# Patient Record
Sex: Male | Born: 1965 | Race: White | Hispanic: No | Marital: Single | Smoking: Current every day smoker
Health system: Southern US, Community
[De-identification: ages and names within clinical notes are randomized; demographics above are authoritative.]

## PROBLEM LIST (undated history)

## (undated) DIAGNOSIS — J449 Chronic obstructive pulmonary disease, unspecified: Secondary | ICD-10-CM

## (undated) DIAGNOSIS — I499 Cardiac arrhythmia, unspecified: Secondary | ICD-10-CM

## (undated) DIAGNOSIS — K2950 Unspecified chronic gastritis without bleeding: Secondary | ICD-10-CM

## (undated) DIAGNOSIS — F419 Anxiety disorder, unspecified: Secondary | ICD-10-CM

## (undated) DIAGNOSIS — E119 Type 2 diabetes mellitus without complications: Secondary | ICD-10-CM

## (undated) DIAGNOSIS — R0902 Hypoxemia: Secondary | ICD-10-CM

## (undated) DIAGNOSIS — K819 Cholecystitis, unspecified: Secondary | ICD-10-CM

## (undated) DIAGNOSIS — Z8639 Personal history of other endocrine, nutritional and metabolic disease: Secondary | ICD-10-CM

## (undated) DIAGNOSIS — F32A Depression, unspecified: Secondary | ICD-10-CM

## (undated) DIAGNOSIS — E669 Obesity, unspecified: Secondary | ICD-10-CM

## (undated) DIAGNOSIS — F909 Attention-deficit hyperactivity disorder, unspecified type: Secondary | ICD-10-CM

## (undated) DIAGNOSIS — J45909 Unspecified asthma, uncomplicated: Secondary | ICD-10-CM

## (undated) DIAGNOSIS — G4730 Sleep apnea, unspecified: Secondary | ICD-10-CM

## (undated) DIAGNOSIS — F329 Major depressive disorder, single episode, unspecified: Secondary | ICD-10-CM

## (undated) DIAGNOSIS — K810 Acute cholecystitis: Secondary | ICD-10-CM

## (undated) DIAGNOSIS — Z23 Encounter for immunization: Secondary | ICD-10-CM

## (undated) DIAGNOSIS — F17200 Nicotine dependence, unspecified, uncomplicated: Secondary | ICD-10-CM

## (undated) DIAGNOSIS — K219 Gastro-esophageal reflux disease without esophagitis: Secondary | ICD-10-CM

## (undated) DIAGNOSIS — Z9189 Other specified personal risk factors, not elsewhere classified: Secondary | ICD-10-CM

## (undated) DIAGNOSIS — M65312 Trigger thumb, left thumb: Secondary | ICD-10-CM

## (undated) DIAGNOSIS — M1990 Unspecified osteoarthritis, unspecified site: Secondary | ICD-10-CM

## (undated) DIAGNOSIS — I10 Essential (primary) hypertension: Secondary | ICD-10-CM

## (undated) DIAGNOSIS — R6889 Other general symptoms and signs: Secondary | ICD-10-CM

## (undated) HISTORY — DX: Anxiety disorder, unspecified: F41.9

## (undated) HISTORY — PX: UPPER GASTROINTESTINAL ENDOSCOPY: SHX188

## (undated) HISTORY — DX: Hypoxemia: R09.02

## (undated) HISTORY — DX: Trigger thumb, left thumb: M65.312

## (undated) HISTORY — DX: Type 2 diabetes mellitus without complications: E11.9

## (undated) HISTORY — DX: Major depressive disorder, single episode, unspecified: F32.9

## (undated) HISTORY — DX: Unspecified asthma, uncomplicated: J45.909

## (undated) HISTORY — DX: Acute cholecystitis: K81.0

## (undated) HISTORY — DX: Other general symptoms and signs: R68.89

## (undated) HISTORY — DX: Nicotine dependence, unspecified, uncomplicated: F17.200

## (undated) HISTORY — DX: Chronic obstructive pulmonary disease, unspecified: J44.9

## (undated) HISTORY — DX: Unspecified osteoarthritis, unspecified site: M19.90

## (undated) HISTORY — DX: Obesity, unspecified: E66.9

## (undated) HISTORY — DX: Encounter for immunization: Z23

## (undated) HISTORY — DX: Unspecified chronic gastritis without bleeding: K29.50

## (undated) HISTORY — DX: Other specified personal risk factors, not elsewhere classified: Z91.89

## (undated) HISTORY — PX: OTHER SURGICAL HISTORY: SHX169

## (undated) HISTORY — DX: Cholecystitis, unspecified: K81.9

## (undated) HISTORY — DX: Personal history of other endocrine, nutritional and metabolic disease: Z86.39

---

## 2008-04-07 ENCOUNTER — Other Ambulatory Visit: Payer: Self-pay

## 2008-04-07 ENCOUNTER — Emergency Department: Payer: Self-pay | Admitting: Emergency Medicine

## 2012-09-08 ENCOUNTER — Emergency Department: Payer: Self-pay | Admitting: Emergency Medicine

## 2012-09-08 LAB — URINALYSIS, COMPLETE
Bilirubin,UR: NEGATIVE
Glucose,UR: NEGATIVE mg/dL (ref 0–75)
Ketone: NEGATIVE
Nitrite: NEGATIVE
Ph: 5 (ref 4.5–8.0)
Protein: NEGATIVE
Specific Gravity: 1.026 (ref 1.003–1.030)
WBC UR: 1 /HPF (ref 0–5)

## 2012-09-08 LAB — COMPREHENSIVE METABOLIC PANEL
Anion Gap: 10 (ref 7–16)
BUN: 20 mg/dL — ABNORMAL HIGH (ref 7–18)
Bilirubin,Total: 0.3 mg/dL (ref 0.2–1.0)
Calcium, Total: 9.1 mg/dL (ref 8.5–10.1)
Chloride: 107 mmol/L (ref 98–107)
Co2: 28 mmol/L (ref 21–32)
Creatinine: 1.01 mg/dL (ref 0.60–1.30)
EGFR (African American): >60
EGFR (Non-African Amer.): >60
SGOT(AST): 23 U/L (ref 15–37)
SGPT (ALT): 25 U/L (ref 12–78)
Sodium: 145 mmol/L (ref 136–145)

## 2012-09-08 LAB — CBC
HCT: 48.1 % (ref 40.0–52.0)
MCH: 31.9 pg (ref 26.0–34.0)
MCHC: 35.4 g/dL (ref 32.0–36.0)
MCV: 90 fL (ref 80–100)
Platelet: 161 10*3/uL (ref 150–440)
RDW: 14.1 % (ref 11.5–14.5)

## 2012-09-08 LAB — CK TOTAL AND CKMB (NOT AT ARMC)
CK, Total: 95 U/L (ref 35–232)
CK-MB: 0.6 ng/mL (ref 0.5–3.6)

## 2012-09-08 LAB — TROPONIN I: Troponin-I: <0.02 ng/mL

## 2012-09-09 LAB — TROPONIN I: Troponin-I: <0.02 ng/mL

## 2012-09-28 ENCOUNTER — Emergency Department: Payer: Self-pay | Admitting: Emergency Medicine

## 2014-07-24 ENCOUNTER — Ambulatory Visit: Payer: Self-pay | Admitting: Gastroenterology

## 2014-11-16 DIAGNOSIS — J449 Chronic obstructive pulmonary disease, unspecified: Secondary | ICD-10-CM | POA: Diagnosis not present

## 2014-11-18 DIAGNOSIS — J449 Chronic obstructive pulmonary disease, unspecified: Secondary | ICD-10-CM | POA: Diagnosis not present

## 2014-11-18 DIAGNOSIS — I720 Aneurysm of carotid artery: Secondary | ICD-10-CM | POA: Diagnosis not present

## 2014-12-04 DIAGNOSIS — J449 Chronic obstructive pulmonary disease, unspecified: Secondary | ICD-10-CM | POA: Diagnosis not present

## 2014-12-17 DIAGNOSIS — J449 Chronic obstructive pulmonary disease, unspecified: Secondary | ICD-10-CM | POA: Diagnosis not present

## 2014-12-19 DIAGNOSIS — I720 Aneurysm of carotid artery: Secondary | ICD-10-CM | POA: Diagnosis not present

## 2014-12-19 DIAGNOSIS — J449 Chronic obstructive pulmonary disease, unspecified: Secondary | ICD-10-CM | POA: Diagnosis not present

## 2014-12-21 DIAGNOSIS — J449 Chronic obstructive pulmonary disease, unspecified: Secondary | ICD-10-CM | POA: Diagnosis not present

## 2015-01-15 DIAGNOSIS — J449 Chronic obstructive pulmonary disease, unspecified: Secondary | ICD-10-CM | POA: Diagnosis not present

## 2015-01-17 DIAGNOSIS — J449 Chronic obstructive pulmonary disease, unspecified: Secondary | ICD-10-CM | POA: Diagnosis not present

## 2015-01-17 DIAGNOSIS — I720 Aneurysm of carotid artery: Secondary | ICD-10-CM | POA: Diagnosis not present

## 2015-02-15 DIAGNOSIS — J449 Chronic obstructive pulmonary disease, unspecified: Secondary | ICD-10-CM | POA: Diagnosis not present

## 2015-02-17 DIAGNOSIS — J449 Chronic obstructive pulmonary disease, unspecified: Secondary | ICD-10-CM | POA: Diagnosis not present

## 2015-02-17 DIAGNOSIS — I720 Aneurysm of carotid artery: Secondary | ICD-10-CM | POA: Diagnosis not present

## 2015-03-17 DIAGNOSIS — J449 Chronic obstructive pulmonary disease, unspecified: Secondary | ICD-10-CM | POA: Diagnosis not present

## 2015-03-19 DIAGNOSIS — J449 Chronic obstructive pulmonary disease, unspecified: Secondary | ICD-10-CM | POA: Diagnosis not present

## 2015-03-19 DIAGNOSIS — I720 Aneurysm of carotid artery: Secondary | ICD-10-CM | POA: Diagnosis not present

## 2015-04-10 ENCOUNTER — Other Ambulatory Visit: Payer: Self-pay

## 2015-04-10 ENCOUNTER — Encounter: Payer: Self-pay | Admitting: *Deleted

## 2015-04-10 ENCOUNTER — Emergency Department
Admission: EM | Admit: 2015-04-10 | Discharge: 2015-04-10 | Disposition: A | Discharge status: Home / Self Care | Payer: Medicare Other | Attending: Emergency Medicine | Admitting: Emergency Medicine

## 2015-04-10 DIAGNOSIS — Z720 Tobacco use: Secondary | ICD-10-CM | POA: Insufficient documentation

## 2015-04-10 DIAGNOSIS — K2900 Acute gastritis without bleeding: Secondary | ICD-10-CM | POA: Diagnosis not present

## 2015-04-10 DIAGNOSIS — R1013 Epigastric pain: Secondary | ICD-10-CM | POA: Diagnosis present

## 2015-04-10 DIAGNOSIS — F419 Anxiety disorder, unspecified: Secondary | ICD-10-CM | POA: Diagnosis not present

## 2015-04-10 DIAGNOSIS — Z79899 Other long term (current) drug therapy: Secondary | ICD-10-CM | POA: Diagnosis not present

## 2015-04-10 DIAGNOSIS — R1010 Upper abdominal pain, unspecified: Secondary | ICD-10-CM | POA: Diagnosis not present

## 2015-04-10 DIAGNOSIS — R0789 Other chest pain: Secondary | ICD-10-CM | POA: Diagnosis not present

## 2015-04-10 HISTORY — DX: Anxiety disorder, unspecified: F41.9

## 2015-04-10 HISTORY — DX: Depression, unspecified: F32.A

## 2015-04-10 HISTORY — DX: Chronic obstructive pulmonary disease, unspecified: J44.9

## 2015-04-10 HISTORY — DX: Major depressive disorder, single episode, unspecified: F32.9

## 2015-04-10 LAB — COMPREHENSIVE METABOLIC PANEL
ALBUMIN: 3.8 g/dL (ref 3.5–5.0)
ALT: 18 U/L (ref 17–63)
AST: 18 U/L (ref 15–41)
Alkaline Phosphatase: 85 U/L (ref 38–126)
Anion gap: 8 (ref 5–15)
BUN: 20 mg/dL (ref 6–20)
CHLORIDE: 107 mmol/L (ref 101–111)
CO2: 25 mmol/L (ref 22–32)
CREATININE: 0.94 mg/dL (ref 0.61–1.24)
Calcium: 9.3 mg/dL (ref 8.9–10.3)
GFR calc Af Amer: >60 mL/min (ref >60)
GFR calc non Af Amer: >60 mL/min (ref >60)
Glucose, Bld: 111 mg/dL — ABNORMAL HIGH (ref 65–99)
Potassium: 3.9 mmol/L (ref 3.5–5.1)
SODIUM: 140 mmol/L (ref 135–145)
Total Bilirubin: 0.4 mg/dL (ref 0.3–1.2)
Total Protein: 6.6 g/dL (ref 6.5–8.1)

## 2015-04-10 LAB — CBC WITH DIFFERENTIAL/PLATELET
BASOS PCT: 0 %
Basophils Absolute: 0 10*3/uL (ref 0–0.1)
EOS ABS: 0.1 10*3/uL (ref 0–0.7)
Eosinophils Relative: 1 %
HEMATOCRIT: 46.6 % (ref 40.0–52.0)
HEMOGLOBIN: 15.5 g/dL (ref 13.0–18.0)
Lymphocytes Relative: 25 %
Lymphs Abs: 2.1 10*3/uL (ref 1.0–3.6)
MCH: 30.8 pg (ref 26.0–34.0)
MCHC: 33.4 g/dL (ref 32.0–36.0)
MCV: 92.4 fL (ref 80.0–100.0)
Monocytes Absolute: 0.4 10*3/uL (ref 0.2–1.0)
Monocytes Relative: 5 %
NEUTROS ABS: 6 10*3/uL (ref 1.4–6.5)
Neutrophils Relative %: 69 %
PLATELETS: 156 10*3/uL (ref 150–440)
RBC: 5.04 MIL/uL (ref 4.40–5.90)
RDW: 13.9 % (ref 11.5–14.5)
WBC: 8.6 10*3/uL (ref 3.8–10.6)

## 2015-04-10 MED ORDER — RANITIDINE HCL 150 MG PO TABS
300.0000 mg | ORAL_TABLET | Freq: Once | ORAL | Status: DC
  Filled 2015-04-10: qty 2

## 2015-04-10 MED ORDER — GI COCKTAIL ~~LOC~~
ORAL | Status: AC
  Administered 2015-04-10: 30 mL via ORAL
  Filled 2015-04-10: qty 30

## 2015-04-10 MED ORDER — GI COCKTAIL ~~LOC~~
30.0000 mL | ORAL | Status: AC
  Administered 2015-04-10: 30 mL via ORAL

## 2015-04-10 MED ORDER — RANITIDINE HCL 150 MG PO TABS
ORAL_TABLET | ORAL | Status: AC
  Administered 2015-04-10: 300 mg
  Filled 2015-04-10: qty 2

## 2015-04-10 MED ORDER — RANITIDINE HCL 150 MG PO CAPS: 150.0000 mg | ORAL_CAPSULE | Freq: Two times a day (BID) | ORAL | Status: DC

## 2015-04-10 MED ORDER — SUCRALFATE 1 G PO TABS: 1.0000 g | ORAL_TABLET | Freq: Four times a day (QID) | ORAL | Status: DC

## 2015-04-10 NOTE — ED Notes (Signed)
Pt discharged home after verbalizing understanding of discharge instructions; nad noted. 

## 2015-04-10 NOTE — ED Provider Notes (Signed)
[facility] Emergency Department Provider Note  ____________________________________________  Time seen: 12:25 PM  I have reviewed the triage vital signs and the nursing notes.   HISTORY  Chief Complaint Abdominal Pain    HPI Eric Lawrence is a 49 y.o. male with a history of chronic gastritis diagnosed by GI by EGD about 9 months ago. This morning he ate his usual breakfast of sausage and eggs and subsequently developed epigastric sharp abdominal pain that was nonradiating and not associated with any other symptoms. He did feel briefly diaphoretic but had no nausea or vomiting, did not radiate anywhere else. No chest pain or shortness of breath. No exacerbation with exertion or deep breath. No fevers or chills. He frequently has similar pain which usually resolves. He notes that he was instructed to modify his diet after his GI workup, but has not done so. He takes no medicines for this gastritis. He also has chronic hypertension requiring 3 agents, but he takes his medicines intermittently missing a few doses each week. He took his medicine this morning but has been missing doses recently.  Denies any numbness tingling or weakness syncope or other dizziness.The abdominal pain is moderate in intensity when it was there, but after the 3 hours of constant pain when it started, it is since resolved and not come back in the last 2 hours.     Past Medical History  Diagnosis Date  . COPD (chronic obstructive pulmonary disease)   . Anxiety   . Depression   . Cancer     There are no active problems to display for this patient.   Past Surgical History  Procedure Laterality Date  . Cyst removal from wrist      Current Outpatient Rx  Name  Route  Sig  Dispense  Refill  . acetaminophen (TYLENOL) 500 MG tablet   Oral   Take 500 mg by mouth every 6 (six) hours as needed.         Marland Kitchen albuterol (PROVENTIL HFA;VENTOLIN HFA) 108 (90 BASE) MCG/ACT inhaler    Inhalation   Inhale into the lungs every 6 (six) hours as needed for wheezing or shortness of breath.         Marland Kitchen amLODipine-benazepril (LOTREL) 5-10 MG per capsule   Oral   Take 1 capsule by mouth daily.         Marland Kitchen co-enzyme Q-10 30 MG capsule   Oral   Take 30 mg by mouth 3 (three) times daily.         . hydrochlorothiazide (HYDRODIURIL) 25 MG tablet   Oral   Take 25 mg by mouth daily.         Marland Kitchen lisinopril (PRINIVIL,ZESTRIL) 40 MG tablet   Oral   Take 40 mg by mouth daily.         . multivitamin-lutein (OCUVITE-LUTEIN) CAPS capsule   Oral   Take 1 capsule by mouth daily.         Marland Kitchen omega-3 acid ethyl esters (LOVAZA) 1 G capsule   Oral   Take by mouth 2 (two) times daily.         . vitamin B-12 (CYANOCOBALAMIN) 500 MCG tablet   Oral   Take 500 mcg by mouth daily.         . ranitidine (ZANTAC) 150 MG capsule   Oral   Take 1 capsule (150 mg total) by mouth 2 (two) times daily.   28 capsule   0   . sucralfate (CARAFATE)  1 G tablet   Oral   Take 1 tablet (1 g total) by mouth 4 (four) times daily.   120 tablet   1     Allergies Review of patient's allergies indicates no known allergies.  No family history on file.  Social History History  Substance Use Topics  . Smoking status: Current Every Day Smoker -- 1.00 packs/day  . Smokeless tobacco: Not on file  . Alcohol Use: No    Review of Systems  Constitutional: No fever or chills. No weight changes Eyes:No blurry vision or double vision.  ENT: No sore throat. Cardiovascular: No chest pain. Respiratory: No dyspnea or cough. Gastrointestinal: Abdominal pain as above without vomiting or diarrhea.  No BRBPR or melena. Genitourinary: Negative for dysuria, urinary retention, bloody urine, or difficulty urinating. Musculoskeletal: Negative for back pain. No joint swelling or pain. Skin: Negative for rash. Neurological: Negative for headaches, focal weakness or numbness. Psychiatric:Chronic anxiety.  Anxiety is currently worse than usual due to personal stressors, which the patient declines to go into at this time.   Endocrine:No hot/cold intolerance, changes in energy, or sleep difficulty.  10-point ROS otherwise negative.  ____________________________________________   PHYSICAL EXAM:  VITAL SIGNS: ED Triage Vitals  Enc Vitals Group     BP 04/10/15 0934 188/105 mmHg     Pulse Rate 04/10/15 0934 70     Resp 04/10/15 0934 18     Temp 04/10/15 0934 97.8 F (36.6 C)     Temp Source 04/10/15 0934 Oral     SpO2 04/10/15 0934 98 %     Weight 04/10/15 0934 274 lb (124.286 kg)     Height 04/10/15 0934 6\' 2"  (1.88 m)     Head Cir --      Peak Flow --      Pain Score 04/10/15 0943 4     Pain Loc --      Pain Edu? --      Excl. in GC? --      Constitutional: Alert and oriented. Well appearing and in no distress. Calm Eyes: No scleral icterus. No conjunctival pallor. PERRL. EOMI ENT   Head: Normocephalic and atraumatic.   Nose: No congestion/rhinnorhea. No septal hematoma   Mouth/Throat: MMM, no pharyngeal erythema. No peritonsillar mass. No uvula shift.   Neck: No stridor. No SubQ emphysema. No meningismus. Hematological/Lymphatic/Immunilogical: No cervical lymphadenopathy. Cardiovascular: RRR. Normal and symmetric distal pulses are present in all extremities. No murmurs, rubs, or gallops. Respiratory: Normal respiratory effort without tachypnea nor retractions. Breath sounds are clear and equal bilaterally. No wheezes/rales/rhonchi. Gastrointestinal: Soft with very mild epigastric tenderness reported by the patient. No visible discomfort with palpation.. No distention. There is no CVA tenderness.  No rebound, rigidity, or guarding. Genitourinary: deferred Musculoskeletal: Nontender with normal range of motion in all extremities. No joint effusions.  No lower extremity tenderness.  No edema. Neurologic:   Normal speech and language.  CN 2-10 normal. Motor grossly  intact. No pronator drift.  Normal gait. No gross focal neurologic deficits are appreciated.  Skin:  Skin is warm, dry and intact. No rash noted.  No petechiae, purpura, or bullae. Psychiatric: Mood and affect are normal. Speech and behavior are normal. Patient has limited insight into the nature of his chronic gastritis..  ____________________________________________    LABS (pertinent positives/negatives) (all labs ordered are listed, but only abnormal results are displayed) Labs Reviewed  COMPREHENSIVE METABOLIC PANEL - Abnormal; Notable for the following:    Glucose, Bld 111 (*)  All other components within normal limits  CBC WITH DIFFERENTIAL/PLATELET   ____________________________________________   EKG Interpreted by me  Date: 04/10/2015  Rate: 66  Rhythm: normal sinus rhythm  QRS Axis: normal  Intervals: normal  ST/T Wave abnormalities: normal  Conduction Disutrbances: none  Narrative Interpretation: unremarkable       ____________________________________________    RADIOLOGY    ____________________________________________   PROCEDURES  ____________________________________________   INITIAL IMPRESSION / ASSESSMENT AND PLAN / ED COURSE  Pertinent labs & imaging results that were available during my care of the patient were reviewed by me and considered in my medical decision making (see chart for details).  History and physical strongly consistent with acute on chronic gastritis. I did encourage the patient to at least consider adhering to the diet modification guidelines given to him by GI. We'll give him a GI cocktail at this time and a short course of Carafate and Zantac due to the likelihood of recurrence of the symptoms over the next few weeks especially with his increased personal stressors. Rate was suspicion of ACS PE TAD pneumothorax dissection or AAA cholecystitis per or other acute  pathology.  ____________________________________________   FINAL CLINICAL IMPRESSION(S) / ED DIAGNOSES  Final diagnoses:  Acute gastritis without hemorrhage      Sharman Cheek, MD 04/10/15 1329

## 2015-04-10 NOTE — ED Notes (Signed)
Pt reports having anxiety, pt has a history of epigastric pain and reports" flare up is worse than normal", pt reports 2 episodes of vomiting today

## 2015-04-10 NOTE — Discharge Instructions (Signed)

## 2015-04-10 NOTE — ED Notes (Signed)
Pt presents c/o chest pain intermittently x 10 years. He states that he has a "knot" in his chest region that causes pain. Today, he states that he awakened this morning feeling sick and sweaty with increased chest pain. He states pain is worst it has ever been today. Denies pain ATT.

## 2015-04-19 ENCOUNTER — Encounter: Payer: Self-pay | Admitting: Family Medicine

## 2015-04-19 ENCOUNTER — Ambulatory Visit (INDEPENDENT_AMBULATORY_CARE_PROVIDER_SITE_OTHER): Payer: Medicare Other | Admitting: Family Medicine

## 2015-04-19 VITALS — BP 129/83 | HR 63 | Temp 98.4°F | Resp 16 | Ht 72.0 in | Wt 238.6 lb

## 2015-04-19 DIAGNOSIS — M1990 Unspecified osteoarthritis, unspecified site: Secondary | ICD-10-CM | POA: Insufficient documentation

## 2015-04-19 DIAGNOSIS — K2950 Unspecified chronic gastritis without bleeding: Secondary | ICD-10-CM

## 2015-04-19 DIAGNOSIS — Z23 Encounter for immunization: Secondary | ICD-10-CM

## 2015-04-19 DIAGNOSIS — J45909 Unspecified asthma, uncomplicated: Secondary | ICD-10-CM | POA: Insufficient documentation

## 2015-04-19 DIAGNOSIS — J449 Chronic obstructive pulmonary disease, unspecified: Secondary | ICD-10-CM

## 2015-04-19 DIAGNOSIS — I10 Essential (primary) hypertension: Secondary | ICD-10-CM | POA: Diagnosis not present

## 2015-04-19 DIAGNOSIS — E785 Hyperlipidemia, unspecified: Secondary | ICD-10-CM | POA: Insufficient documentation

## 2015-04-19 DIAGNOSIS — F419 Anxiety disorder, unspecified: Secondary | ICD-10-CM

## 2015-04-19 DIAGNOSIS — I720 Aneurysm of carotid artery: Secondary | ICD-10-CM | POA: Diagnosis not present

## 2015-04-19 DIAGNOSIS — F329 Major depressive disorder, single episode, unspecified: Secondary | ICD-10-CM

## 2015-04-19 DIAGNOSIS — R6889 Other general symptoms and signs: Secondary | ICD-10-CM

## 2015-04-19 DIAGNOSIS — F17200 Nicotine dependence, unspecified, uncomplicated: Secondary | ICD-10-CM

## 2015-04-19 DIAGNOSIS — Z9189 Other specified personal risk factors, not elsewhere classified: Secondary | ICD-10-CM

## 2015-04-19 DIAGNOSIS — Z8639 Personal history of other endocrine, nutritional and metabolic disease: Secondary | ICD-10-CM

## 2015-04-19 HISTORY — DX: Other general symptoms and signs: R68.89

## 2015-04-19 HISTORY — DX: Unspecified osteoarthritis, unspecified site: M19.90

## 2015-04-19 HISTORY — DX: Unspecified chronic gastritis without bleeding: K29.50

## 2015-04-19 HISTORY — DX: Encounter for immunization: Z23

## 2015-04-19 HISTORY — DX: Other specified personal risk factors, not elsewhere classified: Z91.89

## 2015-04-19 HISTORY — DX: Nicotine dependence, unspecified, uncomplicated: F17.200

## 2015-04-19 HISTORY — DX: Major depressive disorder, single episode, unspecified: F32.9

## 2015-04-19 HISTORY — DX: Anxiety disorder, unspecified: F41.9

## 2015-04-19 HISTORY — DX: Unspecified asthma, uncomplicated: J45.909

## 2015-04-19 HISTORY — DX: Chronic obstructive pulmonary disease, unspecified: J44.9

## 2015-04-19 HISTORY — DX: Personal history of other endocrine, nutritional and metabolic disease: Z86.39

## 2015-04-19 MED ORDER — LISINOPRIL 40 MG PO TABS: 40.0000 mg | ORAL_TABLET | Freq: Every day | ORAL | Status: DC

## 2015-04-19 MED ORDER — AMLODIPINE BESYLATE 5 MG PO TABS: 5.0000 mg | ORAL_TABLET | Freq: Every day | ORAL | Status: DC

## 2015-04-19 MED ORDER — HYDROCHLOROTHIAZIDE 25 MG PO TABS: 25.0000 mg | ORAL_TABLET | Freq: Every day | ORAL | Status: DC

## 2015-04-19 MED ORDER — RANITIDINE HCL 150 MG PO TABS: 150.0000 mg | ORAL_TABLET | Freq: Two times a day (BID) | ORAL | Status: DC

## 2015-04-19 MED ORDER — SUCRALFATE 1 G PO TABS: 1.0000 g | ORAL_TABLET | Freq: Four times a day (QID) | ORAL | Status: DC

## 2015-04-19 NOTE — Progress Notes (Signed)
Name: Eric Lawrence   MRN: [id number]    DOB: 10-Dec-1965   Date:04/19/2015       Progress Note  Subjective  Chief Complaint  Chief Complaint  Patient presents with  . GI Problem    ER 04/10/2015 -gastritis  . Hypertension    HPI   To ER on 6/7 with severe stomach pain.  Dx. Gastritis.  HE has not gotten medications (Zantac and Carfate) yet.  Stomach pain comes and goes.  Hs BMs every 2-3 days.  No N/V now.   Takes BP meds "sometimes".   Weight recorded in ER visit from 04/10/15 was patient estimate only. Past Medical History  Diagnosis Date  . COPD (chronic obstructive pulmonary disease)   . Anxiety   . Depression   . Cancer     History  Substance Use Topics  . Smoking status: Current Every Day Smoker -- 1.00 packs/day  . Smokeless tobacco: Never Used  . Alcohol Use: No     Current outpatient prescriptions:  .  albuterol (PROVENTIL HFA;VENTOLIN HFA) 108 (90 BASE) MCG/ACT inhaler, Inhale into the lungs every 6 (six) hours as needed for wheezing or shortness of breath., Disp: , Rfl:  .  amLODipine (NORVASC) 5 MG tablet, Take 1 tablet (5 mg total) by mouth daily., Disp: 90 tablet, Rfl: 3 .  co-enzyme Q-10 30 MG capsule, Take 30 mg by mouth 3 (three) times daily., Disp: , Rfl:  .  hydrochlorothiazide (HYDRODIURIL) 25 MG tablet, Take 1 tablet (25 mg total) by mouth daily., Disp: 90 tablet, Rfl: 3 .  lisinopril (PRINIVIL,ZESTRIL) 40 MG tablet, Take 1 tablet (40 mg total) by mouth daily., Disp: 90 tablet, Rfl: 3 .  multivitamin-lutein (OCUVITE-LUTEIN) CAPS capsule, Take 1 capsule by mouth daily., Disp: , Rfl:  .  omega-3 acid ethyl esters (LOVAZA) 1 G capsule, Take by mouth 2 (two) times daily., Disp: , Rfl:  .  ranitidine (ZANTAC) 150 MG tablet, Take 1 tablet (150 mg total) by mouth 2 (two) times daily., Disp: 60 tablet, Rfl: 3 .  sucralfate (CARAFATE) 1 G tablet, Take 1 tablet (1 g total) by mouth 4 (four) times daily., Disp: 120 tablet, Rfl: 1 .  vitamin B-12 (CYANOCOBALAMIN)  500 MCG tablet, Take 500 mcg by mouth daily., Disp: , Rfl:  .  acetaminophen (TYLENOL) 500 MG tablet, Take 500 mg by mouth every 6 (six) hours as needed., Disp: , Rfl:  .  amLODipine-benazepril (LOTREL) 5-10 MG per capsule, Take 1 capsule by mouth daily., Disp: , Rfl:  .  Fluticasone-Salmeterol (ADVAIR DISKUS) 250-50 MCG/DOSE AEPB, Inhale into the lungs., Disp: , Rfl:   No Known Allergies  Review of Systems  Constitutional: Positive for weight loss. Negative for fever and chills.  HENT: Negative.   Eyes: Negative.  Negative for blurred vision and double vision.  Respiratory: Negative.  Negative for cough, sputum production, shortness of breath and wheezing.   Cardiovascular: Negative.  Negative for chest pain, palpitations, orthopnea and leg swelling.  Gastrointestinal: Positive for heartburn and nausea. Negative for blood in stool and melena.  Skin: Negative.   Neurological: Negative.  Negative for weakness and headaches.  Psychiatric/Behavioral: Positive for depression (?).      Objective  Filed Vitals:   04/19/15 1025  BP: 129/83  Pulse: 63  Temp: 98.4 F (36.9 C)  Resp: 16  Height: 6' (1.829 m)  Weight: 238 lb 9.6 oz (108.228 kg)     Physical Exam  Constitutional: He is oriented to person, place, and  time and well-developed, well-nourished, and in no distress. No distress.  HENT:  Head: Normocephalic and atraumatic.  Eyes: Conjunctivae and EOM are normal. Pupils are equal, round, and reactive to light. No scleral icterus.  Neck: Normal range of motion. Neck supple. No thyromegaly present.  Cardiovascular: Normal rate, regular rhythm, normal heart sounds and intact distal pulses.  Exam reveals no gallop and no friction rub.   No murmur heard. Pulmonary/Chest: Effort normal and breath sounds normal. No respiratory distress. He has no wheezes. He has no rales.  Abdominal: He exhibits no mass. There is tenderness (mild diffuse tenderness). There is no rebound and no  guarding.  Musculoskeletal: He exhibits no edema.  Lymphadenopathy:    He has no cervical adenopathy.  Neurological: He is alert and oriented to person, place, and time.  Vitals reviewed.     Recent Results (from the past 2160 hour(s))  CBC with Differential     Status: None   Collection Time: 04/10/15  9:55 AM  Result Value Ref Range   WBC 8.6 3.8 - 10.6 K/uL   RBC 5.04 4.40 - 5.90 MIL/uL   Hemoglobin 15.5 13.0 - 18.0 g/dL   HCT 46.6 40.0 - 52.0 %   MCV 92.4 80.0 - 100.0 fL   MCH 30.8 26.0 - 34.0 pg   MCHC 33.4 32.0 - 36.0 g/dL   RDW 13.9 11.5 - 14.5 %   Platelets 156 150 - 440 K/uL   Neutrophils Relative % 69 %   Neutro Abs 6.0 1.4 - 6.5 K/uL   Lymphocytes Relative 25 %   Lymphs Abs 2.1 1.0 - 3.6 K/uL   Monocytes Relative 5 %   Monocytes Absolute 0.4 0.2 - 1.0 K/uL   Eosinophils Relative 1 %   Eosinophils Absolute 0.1 0 - 0.7 K/uL   Basophils Relative 0 %   Basophils Absolute 0.0 0 - 0.1 K/uL  Comprehensive metabolic panel     Status: Abnormal   Collection Time: 04/10/15  9:55 AM  Result Value Ref Range   Sodium 140 135 - 145 mmol/L   Potassium 3.9 3.5 - 5.1 mmol/L   Chloride 107 101 - 111 mmol/L   CO2 25 22 - 32 mmol/L   Glucose, Bld 111 (H) 65 - 99 mg/dL   BUN 20 6 - 20 mg/dL   Creatinine, Ser 0.94 0.61 - 1.24 mg/dL   Calcium 9.3 8.9 - 10.3 mg/dL   Total Protein 6.6 6.5 - 8.1 g/dL   Albumin 3.8 3.5 - 5.0 g/dL   AST 18 15 - 41 U/L   ALT 18 17 - 63 U/L   Alkaline Phosphatase 85 38 - 126 U/L   Total Bilirubin 0.4 0.3 - 1.2 mg/dL   GFR calc non Af Amer >60 >60 mL/min   GFR calc Af Amer >60 >60 mL/min    Comment: (NOTE) The eGFR has been calculated using the CKD EPI equation. This calculation has not been validated in all clinical situations. eGFR's persistently <60 mL/min signify possible Chronic Kidney Disease.    Anion gap 8 5 - 15     Assessment & Plan  1. Chronic gastritis Take the Zantac and Carafate from ER visit as it was prescribed.  2.  Essential hypertension  - amLODipine (NORVASC) 5 MG tablet; Take 1 tablet (5 mg total) by mouth daily.  Dispense: 90 tablet; Refill: 3 - hydrochlorothiazide (HYDRODIURIL) 25 MG tablet; Take 1 tablet (25 mg total) by mouth daily.  Dispense: 90 tablet; Refill: 3 - lisinopril (  PRINIVIL,ZESTRIL) 40 MG tablet; Take 1 tablet (40 mg total) by mouth daily.  Dispense: 90 tablet; Refill: 3

## 2015-04-20 NOTE — Addendum Note (Signed)
Addended by: Venora Maples on: 04/20/2015 10:35 AM   Modules accepted: Kipp Brood

## 2015-05-18 ENCOUNTER — Ambulatory Visit: Payer: Medicare Other | Admitting: Family Medicine

## 2015-05-19 DIAGNOSIS — J449 Chronic obstructive pulmonary disease, unspecified: Secondary | ICD-10-CM | POA: Diagnosis not present

## 2015-05-19 DIAGNOSIS — I720 Aneurysm of carotid artery: Secondary | ICD-10-CM | POA: Diagnosis not present

## 2015-06-08 ENCOUNTER — Ambulatory Visit (INDEPENDENT_AMBULATORY_CARE_PROVIDER_SITE_OTHER): Payer: Medicare Other | Admitting: Family Medicine

## 2015-06-08 ENCOUNTER — Encounter: Payer: Self-pay | Admitting: Family Medicine

## 2015-06-08 VITALS — BP 165/90 | HR 77 | Resp 16 | Ht 72.0 in | Wt 242.6 lb

## 2015-06-08 DIAGNOSIS — K2950 Unspecified chronic gastritis without bleeding: Secondary | ICD-10-CM | POA: Diagnosis not present

## 2015-06-08 DIAGNOSIS — I10 Essential (primary) hypertension: Secondary | ICD-10-CM | POA: Diagnosis not present

## 2015-06-08 MED ORDER — METOPROLOL TARTRATE 50 MG PO TABS: 50.0000 mg | ORAL_TABLET | Freq: Two times a day (BID) | ORAL | Status: DC

## 2015-06-08 MED ORDER — OMEPRAZOLE 20 MG PO CPDR: 20.0000 mg | DELAYED_RELEASE_CAPSULE | Freq: Every day | ORAL | Status: DC

## 2015-06-08 NOTE — Patient Instructions (Signed)
Continue Amlodipine, Lisinopril, and HCTZ.  Discontinue Sucralfate and RaNaTIDINE.  STOP SMOKING!

## 2015-06-08 NOTE — Progress Notes (Signed)
This encounter was created in error - please disregard.

## 2015-06-08 NOTE — Progress Notes (Signed)
Name: Eric Lawrence   MRN: [id number]    DOB: 16-Oct-1966   Date:06/08/2015       Progress Note  Subjective  Chief Complaint  Chief Complaint  Patient presents with  . GI Bleeding    HPI  Here for f/u of chronic gastritis and HBP.  Stomach pain has resolved.  Still smoking.   Past Medical History  Diagnosis Date  . COPD (chronic obstructive pulmonary disease)   . Anxiety   . Depression   . Cancer     History  Substance Use Topics  . Smoking status: Current Every Day Smoker -- 0.75 packs/day    Types: Cigarettes  . Smokeless tobacco: Never Used  . Alcohol Use: No     Current outpatient prescriptions:  .  acetaminophen (TYLENOL) 500 MG tablet, Take 500 mg by mouth every 6 (six) hours as needed., Disp: , Rfl:  .  albuterol (PROVENTIL HFA;VENTOLIN HFA) 108 (90 BASE) MCG/ACT inhaler, Inhale into the lungs every 6 (six) hours as needed for wheezing or shortness of breath., Disp: , Rfl:  .  AMLODIPINE BESYLATE PO, Take 10 mg by mouth., Disp: , Rfl:  .  co-enzyme Q-10 30 MG capsule, Take 30 mg by mouth 3 (three) times daily., Disp: , Rfl:  .  Fluticasone-Salmeterol (ADVAIR DISKUS) 250-50 MCG/DOSE AEPB, Inhale into the lungs., Disp: , Rfl:  .  hydrochlorothiazide (HYDRODIURIL) 25 MG tablet, Take 1 tablet (25 mg total) by mouth daily., Disp: 90 tablet, Rfl: 3 .  lisinopril (PRINIVIL,ZESTRIL) 40 MG tablet, Take 1 tablet (40 mg total) by mouth daily., Disp: 90 tablet, Rfl: 3 .  multivitamin-lutein (OCUVITE-LUTEIN) CAPS capsule, Take 1 capsule by mouth daily., Disp: , Rfl:  .  omega-3 acid ethyl esters (LOVAZA) 1 G capsule, Take by mouth 2 (two) times daily., Disp: , Rfl:  .  ranitidine (ZANTAC) 150 MG capsule, , Disp: , Rfl:  .  sucralfate (CARAFATE) 1 G tablet, Take 1 tablet (1 g total) by mouth 4 (four) times daily., Disp: 120 tablet, Rfl: 1 .  vitamin B-12 (CYANOCOBALAMIN) 500 MCG tablet, Take 500 mcg by mouth daily., Disp: , Rfl:   No Known Allergies  Review of Systems   Constitutional: Negative for fever, chills, weight loss and malaise/fatigue.  Eyes: Negative for blurred vision and double vision.  Respiratory: Negative for cough, sputum production, shortness of breath and wheezing.   Cardiovascular: Negative for chest pain, palpitations, orthopnea and leg swelling.  Gastrointestinal: Negative for heartburn, nausea, vomiting, abdominal pain, diarrhea and blood in stool.  Genitourinary: Negative for dysuria, urgency and frequency.  Skin: Negative for rash.  Neurological: Negative for weakness and headaches.      Objective  Filed Vitals:   06/08/15 1349  BP: 164/85  Pulse: 77  Resp: 16  Height: 6' (1.829 m)  Weight: 242 lb 9.6 oz (110.043 kg)     Physical Exam  Constitutional: He is well-developed, well-nourished, and in no distress. No distress.  HENT:  Head: Normocephalic and atraumatic.  Eyes: Conjunctivae and EOM are normal. Pupils are equal, round, and reactive to light. No scleral icterus.  Neck: Normal range of motion. Neck supple. No thyromegaly present.  Cardiovascular: Normal rate, regular rhythm, normal heart sounds and intact distal pulses.  Exam reveals no gallop and no friction rub.   No murmur heard. Pulmonary/Chest: Effort normal and breath sounds normal. No respiratory distress. He has no wheezes. He has no rales.  Abdominal: Soft. Bowel sounds are normal. He exhibits distension. He exhibits  no mass. There is tenderness.  Musculoskeletal: He exhibits no edema.  Lymphadenopathy:    He has no cervical adenopathy.  Vitals reviewed.     Recent Results (from the past 2160 hour(s))  CBC with Differential     Status: None   Collection Time: 04/10/15  9:55 AM  Result Value Ref Range   WBC 8.6 3.8 - 10.6 K/uL   RBC 5.04 4.40 - 5.90 MIL/uL   Hemoglobin 15.5 13.0 - 18.0 g/dL   HCT 46.6 40.0 - 52.0 %   MCV 92.4 80.0 - 100.0 fL   MCH 30.8 26.0 - 34.0 pg   MCHC 33.4 32.0 - 36.0 g/dL   RDW 13.9 11.5 - 14.5 %   Platelets 156  150 - 440 K/uL   Neutrophils Relative % 69 %   Neutro Abs 6.0 1.4 - 6.5 K/uL   Lymphocytes Relative 25 %   Lymphs Abs 2.1 1.0 - 3.6 K/uL   Monocytes Relative 5 %   Monocytes Absolute 0.4 0.2 - 1.0 K/uL   Eosinophils Relative 1 %   Eosinophils Absolute 0.1 0 - 0.7 K/uL   Basophils Relative 0 %   Basophils Absolute 0.0 0 - 0.1 K/uL  Comprehensive metabolic panel     Status: Abnormal   Collection Time: 04/10/15  9:55 AM  Result Value Ref Range   Sodium 140 135 - 145 mmol/L   Potassium 3.9 3.5 - 5.1 mmol/L   Chloride 107 101 - 111 mmol/L   CO2 25 22 - 32 mmol/L   Glucose, Bld 111 (H) 65 - 99 mg/dL   BUN 20 6 - 20 mg/dL   Creatinine, Ser 0.94 0.61 - 1.24 mg/dL   Calcium 9.3 8.9 - 10.3 mg/dL   Total Protein 6.6 6.5 - 8.1 g/dL   Albumin 3.8 3.5 - 5.0 g/dL   AST 18 15 - 41 U/L   ALT 18 17 - 63 U/L   Alkaline Phosphatase 85 38 - 126 U/L   Total Bilirubin 0.4 0.3 - 1.2 mg/dL   GFR calc non Af Amer >60 >60 mL/min   GFR calc Af Amer >60 >60 mL/min    Comment: (NOTE) The eGFR has been calculated using the CKD EPI equation. This calculation has not been validated in all clinical situations. eGFR's persistently <60 mL/min signify possible Chronic Kidney Disease.    Anion gap 8 5 - 15     Assessment & Plan  Problem List Items Addressed This Visit      Cardiovascular and Mediastinum   BP (high blood pressure)   Relevant Medications   metoprolol (LOPRESSOR) 50 MG tablet     Digestive   Chronic gastritis - Primary   Relevant Medications   omeprazole (PRILOSEC) 20 MG capsule

## 2015-06-10 DIAGNOSIS — R1084 Generalized abdominal pain: Secondary | ICD-10-CM | POA: Diagnosis not present

## 2015-06-11 ENCOUNTER — Telehealth: Payer: Self-pay | Admitting: Family Medicine

## 2015-06-11 NOTE — Telephone Encounter (Signed)
Can I send this Rx Dr. Juanetta Gosling ?

## 2015-06-11 NOTE — Telephone Encounter (Signed)
Carlynn Spry said pt had a severe case of acid reflux and had to call the ambulance.  He had not been taking his medication and decided to eat spicy [place] food.  She asked if Dr. Juanetta Gosling could send a prescription for the sucralfate t o [facility].  Her call back number is [phone]

## 2015-06-11 NOTE — Telephone Encounter (Signed)
He should be taking the Omeprazole twice a day as I had recommended.  I will talk with his mentor Dewayne Hatch), about, this.-jh

## 2015-06-19 DIAGNOSIS — I720 Aneurysm of carotid artery: Secondary | ICD-10-CM | POA: Diagnosis not present

## 2015-06-19 DIAGNOSIS — J449 Chronic obstructive pulmonary disease, unspecified: Secondary | ICD-10-CM | POA: Diagnosis not present

## 2015-07-03 ENCOUNTER — Encounter: Payer: Self-pay | Admitting: Family Medicine

## 2015-07-06 ENCOUNTER — Telehealth: Payer: Self-pay

## 2015-07-06 NOTE — Telephone Encounter (Signed)
Form to be sent for medical equipment.

## 2015-07-11 ENCOUNTER — Telehealth: Payer: Self-pay | Admitting: *Deleted

## 2015-07-11 NOTE — Telephone Encounter (Signed)
Pt informed that he needs to schedule 1 mos f/u appt. He is getting Ann to call and schedule. Nothing further needed.

## 2015-07-11 NOTE — Telephone Encounter (Signed)
Called patient to find out if he has ever had a sleep study done. Pt says he didn't think so but did a test thru ins. Where he wore a watch to bed that recorded syncopal moments. I told him a sleep study or oxygen oximetry may have to be ordered.

## 2015-07-11 NOTE — Telephone Encounter (Signed)
We can discuss this further at his next visit.-jh

## 2015-07-20 DIAGNOSIS — I720 Aneurysm of carotid artery: Secondary | ICD-10-CM | POA: Diagnosis not present

## 2015-07-20 DIAGNOSIS — J449 Chronic obstructive pulmonary disease, unspecified: Secondary | ICD-10-CM | POA: Diagnosis not present

## 2015-08-01 ENCOUNTER — Ambulatory Visit: Payer: Medicare Other | Admitting: Family Medicine

## 2015-08-19 DIAGNOSIS — J449 Chronic obstructive pulmonary disease, unspecified: Secondary | ICD-10-CM | POA: Diagnosis not present

## 2015-08-19 DIAGNOSIS — I720 Aneurysm of carotid artery: Secondary | ICD-10-CM | POA: Diagnosis not present

## 2015-08-31 ENCOUNTER — Ambulatory Visit: Payer: Medicare Other | Admitting: Family Medicine

## 2015-09-10 ENCOUNTER — Ambulatory Visit (INDEPENDENT_AMBULATORY_CARE_PROVIDER_SITE_OTHER): Payer: Medicare Other | Admitting: Family Medicine

## 2015-09-10 ENCOUNTER — Encounter: Payer: Self-pay | Admitting: Family Medicine

## 2015-09-10 VITALS — BP 180/95 | HR 88 | Resp 16 | Ht 72.0 in | Wt 246.0 lb

## 2015-09-10 DIAGNOSIS — E669 Obesity, unspecified: Secondary | ICD-10-CM

## 2015-09-10 DIAGNOSIS — K2950 Unspecified chronic gastritis without bleeding: Secondary | ICD-10-CM

## 2015-09-10 DIAGNOSIS — Z23 Encounter for immunization: Secondary | ICD-10-CM

## 2015-09-10 DIAGNOSIS — F17200 Nicotine dependence, unspecified, uncomplicated: Secondary | ICD-10-CM

## 2015-09-10 DIAGNOSIS — I10 Essential (primary) hypertension: Secondary | ICD-10-CM | POA: Diagnosis not present

## 2015-09-10 HISTORY — DX: Obesity, unspecified: E66.9

## 2015-09-10 MED ORDER — METOPROLOL TARTRATE 100 MG PO TABS: ORAL_TABLET | ORAL | Status: DC

## 2015-09-10 NOTE — Patient Instructions (Signed)
Again recommended that he stop smoking.

## 2015-09-10 NOTE — Progress Notes (Signed)
Name: Eric Lawrence   MRN: [id number]    DOB: 05/23/1966   Date:09/10/2015       Progress Note  Subjective  Chief Complaint  Chief Complaint  Patient presents with  . Hypertension    HPI Here for f/u of HBP.  Says taking all meds.but only taking Metoprolol tartrate once daily.  Still smoking.  Not losing weight.  No problem-specific assessment & plan notes found for this encounter.   Past Medical History  Diagnosis Date  . COPD (chronic obstructive pulmonary disease) (HCC)   . Anxiety   . Depression   . Cancer [facility](HCC)     Social History  Substance Use Topics  . Smoking status: Current Every Day Smoker -- 2.00 packs/day    Types: Cigarettes  . Smokeless tobacco: Never Used  . Alcohol Use: No     Current outpatient prescriptions:  .  acetaminophen (TYLENOL) 500 MG tablet, Take 500 mg by mouth every 6 (six) hours as needed., Disp: , Rfl:  .  albuterol (PROVENTIL HFA;VENTOLIN HFA) 108 (90 BASE) MCG/ACT inhaler, Inhale into the lungs every 6 (six) hours as needed for wheezing or shortness of breath., Disp: , Rfl:  .  AMLODIPINE BESYLATE PO, Take 10 mg by mouth., Disp: , Rfl:  .  co-enzyme Q-10 30 MG capsule, Take 30 mg by mouth 3 (three) times daily., Disp: , Rfl:  .  hydrochlorothiazide (HYDRODIURIL) 25 MG tablet, Take 1 tablet (25 mg total) by mouth daily., Disp: 90 tablet, Rfl: 3 .  lisinopril (PRINIVIL,ZESTRIL) 40 MG tablet, Take 1 tablet (40 mg total) by mouth daily., Disp: 90 tablet, Rfl: 3 .  metoprolol (LOPRESSOR) 50 MG tablet, Take 1 tablet (50 mg total) by mouth 2 (two) times daily., Disp: 60 tablet, Rfl: 12 .  multivitamin-lutein (OCUVITE-LUTEIN) CAPS capsule, Take 1 capsule by mouth daily., Disp: , Rfl:  .  omega-3 acid ethyl esters (LOVAZA) 1 G capsule, Take by mouth 2 (two) times daily., Disp: , Rfl:  .  omeprazole (PRILOSEC) 20 MG capsule, Take 1 capsule (20 mg total) by mouth daily., Disp: 30 capsule, Rfl: 12 .  OXYGEN, Inhale into the lungs., Disp: , Rfl:   No  Known Allergies  Review of Systems  Constitutional: Negative for fever, chills, weight loss and malaise/fatigue.  HENT: Negative for hearing loss.   Eyes: Negative for blurred vision and double vision.  Respiratory: Negative for cough, sputum production, shortness of breath and wheezing.   Cardiovascular: Negative for chest pain, palpitations, orthopnea and leg swelling.  Gastrointestinal: Negative for heartburn, nausea, vomiting, abdominal pain, diarrhea and blood in stool.  Genitourinary: Negative for dysuria, urgency and frequency.  Musculoskeletal: Negative for myalgias and joint pain.  Skin: Negative for rash.  Neurological: Negative for dizziness, tremors, weakness and headaches.  Psychiatric/Behavioral: Negative for depression. The patient is not nervous/anxious and does not have insomnia.       Objective  Filed Vitals:   09/10/15 1557 09/10/15 1559  BP: 179/109 180/90  Pulse: 83 78  Resp: 16   Height: 6' (1.829 m)   Weight: 246 lb (111.585 kg)      Physical Exam  Constitutional: He is oriented to person, place, and time and well-developed, well-nourished, and in no distress. No distress.  HENT:  Head: Normocephalic and atraumatic.  Eyes: Conjunctivae and EOM are normal. Pupils are equal, round, and reactive to light. No scleral icterus.  Neck: Normal range of motion. Neck supple. Carotid bruit is not present. No thyromegaly present.  Cardiovascular:  Normal rate, regular rhythm and intact distal pulses.  Exam reveals gallop and friction rub.   Murmur heard. Pulmonary/Chest: Effort normal and breath sounds normal. No respiratory distress. He has no wheezes. He has no rales.  Abdominal: Soft. Bowel sounds are normal. He exhibits no distension, no abdominal bruit and no mass. There is no tenderness.  Musculoskeletal: He exhibits no edema.  Lymphadenopathy:    He has no cervical adenopathy.  Neurological: He is alert and oriented to person, place, and time.  Vitals  reviewed.     No results found for this or any previous visit (from the past 2160 hour(s)).   Assessment & Plan  1. Essential hypertension  - metoprolol (LOPRESSOR) 100 MG tablet;  Take 1 tablet twice a day.  Dispense: 60 tablet; Refill: 12 - continue Amlodipine, Lisinopril and HCTZ 2. Need for influenza vaccination  - Flu Vaccine QUAD 36+ mos PF IM (Fluarix & Fluzone Quad PF)  3. Obesity   4. Chronic gastritis -cont Omeprazole.  5. Compulsive tobacco user syndrome

## 2015-09-19 DIAGNOSIS — J449 Chronic obstructive pulmonary disease, unspecified: Secondary | ICD-10-CM | POA: Diagnosis not present

## 2015-09-19 DIAGNOSIS — I720 Aneurysm of carotid artery: Secondary | ICD-10-CM | POA: Diagnosis not present

## 2015-10-09 ENCOUNTER — Emergency Department: Payer: Medicare Other

## 2015-10-09 ENCOUNTER — Emergency Department
Admission: EM | Admit: 2015-10-09 | Discharge: 2015-10-09 | Disposition: A | Discharge status: Home / Self Care | Payer: Medicare Other | Attending: Emergency Medicine | Admitting: Emergency Medicine

## 2015-10-09 DIAGNOSIS — R4182 Altered mental status, unspecified: Secondary | ICD-10-CM | POA: Diagnosis not present

## 2015-10-09 DIAGNOSIS — R1013 Epigastric pain: Secondary | ICD-10-CM | POA: Diagnosis not present

## 2015-10-09 DIAGNOSIS — K219 Gastro-esophageal reflux disease without esophagitis: Secondary | ICD-10-CM | POA: Insufficient documentation

## 2015-10-09 DIAGNOSIS — F17210 Nicotine dependence, cigarettes, uncomplicated: Secondary | ICD-10-CM | POA: Diagnosis not present

## 2015-10-09 DIAGNOSIS — Z79899 Other long term (current) drug therapy: Secondary | ICD-10-CM | POA: Diagnosis not present

## 2015-10-09 DIAGNOSIS — J441 Chronic obstructive pulmonary disease with (acute) exacerbation: Secondary | ICD-10-CM | POA: Diagnosis not present

## 2015-10-09 LAB — CBC
HCT: 47.3 % (ref 40.0–52.0)
Hemoglobin: 16 g/dL (ref 13.0–18.0)
MCH: 31 pg (ref 26.0–34.0)
MCHC: 33.9 g/dL (ref 32.0–36.0)
MCV: 91.5 fL (ref 80.0–100.0)
Platelets: 162 10*3/uL (ref 150–440)
RBC: 5.17 MIL/uL (ref 4.40–5.90)
RDW: 13.7 % (ref 11.5–14.5)
WBC: 8.6 10*3/uL (ref 3.8–10.6)

## 2015-10-09 LAB — COMPREHENSIVE METABOLIC PANEL
ALT: 19 U/L (ref 17–63)
AST: 16 U/L (ref 15–41)
Albumin: 4 g/dL (ref 3.5–5.0)
Alkaline Phosphatase: 79 U/L (ref 38–126)
Anion gap: 7 (ref 5–15)
BUN: 17 mg/dL (ref 6–20)
CHLORIDE: 106 mmol/L (ref 101–111)
CO2: 31 mmol/L (ref 22–32)
Calcium: 9.2 mg/dL (ref 8.9–10.3)
Creatinine, Ser: 1.03 mg/dL (ref 0.61–1.24)
Glucose, Bld: 159 mg/dL — ABNORMAL HIGH (ref 65–99)
POTASSIUM: 4 mmol/L (ref 3.5–5.1)
Sodium: 144 mmol/L (ref 135–145)
TOTAL PROTEIN: 7 g/dL (ref 6.5–8.1)
Total Bilirubin: 0.3 mg/dL (ref 0.3–1.2)

## 2015-10-09 LAB — TROPONIN I: Troponin I: <0.03 ng/mL (ref <0.031)

## 2015-10-09 MED ORDER — SUCRALFATE 1 G PO TABS: 1.0000 g | ORAL_TABLET | Freq: Two times a day (BID) | ORAL | Status: AC

## 2015-10-09 NOTE — ED Notes (Signed)
Entered room and pt had already stripped his bed and put his dirty linens in the laundry bin   Discharge and follow up instructions reviewed with him and he verbalized agreement and understanding   Pt with an entire bottle of sucralfate in his bag at discharge  New script provided  Pt encouraged to follow up with his primary md

## 2015-10-09 NOTE — ED Provider Notes (Signed)
[facility] Emergency Department Provider Note  ____________________________________________  Time seen: Approximately 542 AM  I have reviewed the triage vital signs and the nursing notes.   HISTORY  Chief Complaint Gastroesophageal Reflux    HPI Eric Lawrence is a 49 y.o. male who comes into the hospital with epigastric pain. 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 The patient has been trying to decrease his portion sizes but in the last few days he has been over eating. He reports that yesterday he had some pork chops and went to bed but woke up sweaty around midnight. He walked around and vomited a few times but continued to walk around. He reports the pain is in his upper abdomen. After continuing to walk around he decided to call 911 to come into the hospital. The patient reports that the pain has eased up some but he feels as though he needs to be on an antibiotic. The patient reports that his pain is currently a 1 out of 10 in intensity. He denies that he has been eating spicy food. He reports that he drinks decaf tea and coffee. He has some shortness of breath but wears O2 at night. The patient was unsure what was going on so he decided to come in for evaluation.   Past Medical History  Diagnosis Date  . COPD (chronic obstructive pulmonary disease) (HCC)   . Anxiety   . Depression   . Cancer [facility](HCC)     Patient Active Problem List   Diagnosis Date Noted  . Obesity 09/10/2015  . Need for influenza vaccination 09/10/2015  . Anxiety disorder 04/19/2015  . Arthritis 04/19/2015  . Airway hyperreactivity 04/19/2015  . CAFL (chronic airflow limitation) (HCC) 04/19/2015  . Affective disorder, major (HCC) 04/19/2015  . Encounter for general adult medical examination without abnormal findings 04/19/2015  . H/O elevated lipids  04/19/2015  . BP (high blood pressure) 04/19/2015  . Encounter for immunization 04/19/2015  . Pneumococcal vaccination given 04/19/2015  . Mechanical and motor problems with internal organs 04/19/2015  . At risk for injury 04/19/2015  . Compulsive tobacco user syndrome 04/19/2015  . Chronic gastritis 04/19/2015    Past Surgical History  Procedure Laterality Date  . Cyst removal from wrist      Current Outpatient Rx  Name  Route  Sig  Dispense  Refill  . acetaminophen (TYLENOL) 500 MG tablet   Oral   Take 500 mg by mouth every 6 (six) hours as needed.         Marland Kitchen. albuterol (PROVENTIL HFA;VENTOLIN HFA) 108 (90 BASE) MCG/ACT inhaler   Inhalation   Inhale into the lungs every 6 (six) hours as needed for wheezing or shortness of breath.         . AMLODIPINE BESYLATE PO   Oral   Take 10 mg by mouth.         . co-enzyme Q-10 30 MG capsule   Oral   Take 30 mg by mouth 3 (three) times daily.         . hydrochlorothiazide (HYDRODIURIL) 25 MG tablet   Oral   Take 1 tablet (25 mg total) by mouth daily.   90 tablet   3   . lisinopril (PRINIVIL,ZESTRIL) 40 MG tablet   Oral   Take 1 tablet (40 mg total) by mouth daily.   90 tablet  3   . metoprolol (LOPRESSOR) 100 MG tablet       Take 1 tablet twice a day.   60 tablet   12   . multivitamin-lutein (OCUVITE-LUTEIN) CAPS capsule   Oral   Take 1 capsule by mouth daily.         Marland Kitchen omega-3 acid ethyl esters (LOVAZA) 1 G capsule   Oral   Take by mouth 2 (two) times daily.         Marland Kitchen omeprazole (PRILOSEC) 20 MG capsule   Oral   Take 1 capsule (20 mg total) by mouth daily.   30 capsule   12   . OXYGEN   Inhalation   Inhale into the lungs.         . sucralfate (CARAFATE) 1 G tablet   Oral   Take 1 tablet (1 g total) by mouth 2 (two) times daily.   20 tablet   0     Allergies Review of patient's allergies indicates no known allergies.  Family History  Problem Relation Age of Onset  . Stroke Mother  27  . Diabetes Mother   . Kidney disease Mother   . Hypertension Mother   . COPD Maternal Aunt   . Diabetes Maternal Grandmother     Social History Social History  Substance Use Topics  . Smoking status: Current Every Day Smoker -- 2.00 packs/day    Types: Cigarettes  . Smokeless tobacco: Never Used  . Alcohol Use: No    Review of Systems Constitutional: No fever/chills Eyes: No visual changes. ENT: No sore throat. Cardiovascular:  chest pain. Respiratory:  shortness of breath. Gastrointestinal:abdominal pain.   nausea, vomiting.  No diarrhea.  No constipation. Genitourinary: Negative for dysuria. Musculoskeletal: Negative for back pain. Skin: Negative for rash. Neurological: Negative for headaches, focal weakness or numbness.  10-point ROS otherwise negative.  ____________________________________________   PHYSICAL EXAM:  VITAL SIGNS: ED Triage Vitals  Enc Vitals Group     BP 10/09/15 0155 198/106 mmHg     Pulse Rate 10/09/15 0155 74     Resp 10/09/15 0155 18     Temp 10/09/15 0155 98 F (36.7 C)     Temp Source 10/09/15 0155 Oral     SpO2 10/09/15 0155 96 %     Weight 10/09/15 0155 260 lb (117.935 kg)     Height 10/09/15 0155  (1.88 m)     Head Cir --      Peak Flow --      Pain Score 10/09/15 0157 6     Pain Loc --      Pain Edu? --      Excl. in GC? --     Constitutional: Alert and oriented. Well appearing and in no acute distress. Eyes: Conjunctivae are normal. PERRL. EOMI. Head: Atraumatic. Nose: No congestion/rhinnorhea. Mouth/Throat: Mucous membranes are moist.  Oropharynx non-erythematous. Cardiovascular: Normal rate, regular rhythm. Grossly normal heart sounds.  Good peripheral circulation. Respiratory: Normal respiratory effort.  No retractions. Lungs CTAB. Gastrointestinal: Soft and nontender. No distention. Positive bowel sounds Musculoskeletal: No lower extremity tenderness nor edema.   Neurologic:  Normal speech and language.   Skin:  Skin is warm, dry and intact.  Psychiatric: Mood and affect are normal.   ____________________________________________   LABS (all labs ordered are listed, but only abnormal results are displayed)  Labs Reviewed  COMPREHENSIVE METABOLIC PANEL - Abnormal; Notable for the following:    Glucose, Bld 159 (*)    All other  components within normal limits  TROPONIN I  CBC  TROPONIN I   ____________________________________________  EKG  ED ECG REPORT I, Rebecka Apley, the attending physician, personally viewed and interpreted this ECG.   Date: 10/09/2015  EKG Time: 211  Rate: 66  Rhythm: unchanged from previous tracings  Axis: normal  Intervals:none  ST&T Change: none  ____________________________________________  RADIOLOGY  Chest x-ray: No active cardiopulmonary disease, no free air seen underneath the diaphragms ____________________________________________   PROCEDURES  Procedure(s) performed: None  Critical Care performed: No  ____________________________________________   INITIAL IMPRESSION / ASSESSMENT AND PLAN / ED COURSE  Pertinent labs & imaging results that were available during my care of the patient were reviewed by me and considered in my medical decision making (see chart for details).  The patient is a 49 year old male who comes in with epigastric pain. The patient does have a history of gastric erosion as well as reflux. The patient did have an EKG as well as 2 sets of cardiac enzymes which are negative. The patient's pain though was completely resolved by the time I had seen and evaluated him. I feel the patient is to follow back up with his gastroenterologist who did his initial colonoscopy and endoscopy. I will give the patient some Carafate for home and have him follow-up with his primary care physician. ____________________________________________   FINAL CLINICAL IMPRESSION(S) / ED DIAGNOSES  Final diagnoses:  Epigastric pain   Gastroesophageal reflux disease, esophagitis presence not specified      Rebecka Apley, MD 10/09/15 [phone]

## 2015-10-09 NOTE — ED Notes (Signed)
Patient awakened by epigastric pain and called 911. Has had this "for years" and he just can't take it anymore." Is on "oxygen at night because Dr. Juanetta GoslingHawkins said I stopped breathing when I sleep." Patient states he has GERD and has not been on his medicine for a week. Has also not been taking his blood pressure medicine "because he is frustrated." "I have ADHD and I can't concentrate on it."

## 2015-10-09 NOTE — Discharge Instructions (Signed)
Gastroesophageal Reflux Disease, Adult Normally, food travels down the esophagus and stays in the stomach to be digested. However, when a person has gastroesophageal reflux disease (GERD), food and stomach acid move back up into the esophagus. When this happens, the esophagus becomes sore and inflamed. Over time, GERD can create small holes (ulcers) in the lining of the esophagus.  CAUSES This condition is caused by a problem with the muscle between the esophagus and the stomach (lower esophageal sphincter, or LES). Normally, the LES muscle closes after food passes through the esophagus to the stomach. When the LES is weakened or abnormal, it does not close properly, and that allows food and stomach acid to go back up into the esophagus. The LES can be weakened by certain dietary substances, medicines, and medical conditions, including:  Tobacco use.  Pregnancy.  Having a hiatal hernia.  Heavy alcohol use.  Certain foods and beverages, such as coffee, chocolate, onions, and peppermint. RISK FACTORS This condition is more likely to develop in:  People who have an increased body weight.  People who have connective tissue disorders.  People who use NSAID medicines. SYMPTOMS Symptoms of this condition include:  Heartburn.  Difficult or painful swallowing.  The feeling of having a lump in the throat.  Abitter taste in the mouth.  Bad breath.  Having a large amount of saliva.  Having an upset or bloated stomach.  Belching.  Chest pain.  Shortness of breath or wheezing.  Ongoing (chronic) cough or a night-time cough.  Wearing away of tooth enamel.  Weight loss. Different conditions can cause chest pain. Make sure to see your health care provider if you experience chest pain. DIAGNOSIS Your health care provider will take a medical history and perform a physical exam. To determine if you have mild or severe GERD, your health care provider may also monitor how you respond  to treatment. You may also have other tests, including:  An endoscopy toexamine your stomach and esophagus with a small camera.  A test thatmeasures the acidity level in your esophagus.  A test thatmeasures how much pressure is on your esophagus.  A barium swallow or modified barium swallow to show the shape, size, and functioning of your esophagus. TREATMENT The goal of treatment is to help relieve your symptoms and to prevent complications. Treatment for this condition may vary depending on how severe your symptoms are. Your health care provider may recommend:  Changes to your diet.  Medicine.  Surgery. HOME CARE INSTRUCTIONS Diet  Follow a diet as recommended by your health care provider. This may involve avoiding foods and drinks such as:  Coffee and tea (with or without caffeine).  Drinks that containalcohol.  Energy drinks and sports drinks.  Carbonated drinks or sodas.  Chocolate and cocoa.  Peppermint and mint flavorings.  Garlic and onions.  Horseradish.  Spicy and acidic foods, including peppers, chili powder, curry powder, vinegar, hot sauces, and barbecue sauce.  Citrus fruit juices and citrus fruits, such as oranges, lemons, and limes.  Tomato-based foods, such as red sauce, chili, salsa, and pizza with red sauce.  Fried and fatty foods, such as donuts, french fries, potato chips, and high-fat dressings.  High-fat meats, such as hot dogs and fatty cuts of red and white meats, such as rib eye steak, sausage, ham, and bacon.  High-fat dairy items, such as whole milk, butter, and cream cheese.  Eat small, frequent meals instead of large meals.  Avoid drinking large amounts of liquid with your  meals.  Avoid eating meals during the 2-3 hours before bedtime.  Avoid lying down right after you eat.  Do not exercise right after you eat. General Instructions  Pay attention to any changes in your symptoms.  Take over-the-counter and prescription  medicines only as told by your health care provider. Do not take aspirin, ibuprofen, or other NSAIDs unless your health care provider told you to do so.  Do not use any tobacco products, including cigarettes, chewing tobacco, and e-cigarettes. If you need help quitting, ask your health care provider.  Wear loose-fitting clothing. Do not wear anything tight around your waist that causes pressure on your abdomen.  Raise (elevate) the head of your bed 6 inches (15cm).  Try to reduce your stress, such as with yoga or meditation. If you need help reducing stress, ask your health care provider.  If you are overweight, reduce your weight to an amount that is healthy for you. Ask your health care provider for guidance about a safe weight loss goal.  Keep all follow-up visits as told by your health care provider. This is important. SEEK MEDICAL CARE IF:  You have new symptoms.  You have unexplained weight loss.  You have difficulty swallowing, or it hurts to swallow.  You have wheezing or a persistent cough.  Your symptoms do not improve with treatment.  You have a hoarse voice. SEEK IMMEDIATE MEDICAL CARE IF:  You have pain in your arms, neck, jaw, teeth, or back.  You feel sweaty, dizzy, or light-headed.  You have chest pain or shortness of breath.  You vomit and your vomit looks like blood or coffee grounds.  You faint.  Your stool is bloody or black.  You cannot swallow, drink, or eat.   This information is not intended to replace advice given to you by your health care provider. Make sure you discuss any questions you have with your health care provider.   Document Released: 07/30/2005 Document Revised: 07/11/2015 Document Reviewed: 02/14/2015 Elsevier Interactive Patient Education 2016 [employer]  Heartburn Heartburn is a type of pain or discomfort that can happen in the throat or chest. It is often described as a burning pain. It may also cause a bad taste in the  mouth. Heartburn may feel worse when you lie down or bend over, and it is often worse at night. Heartburn may be caused by stomach contents that move back up into the esophagus (reflux). HOME CARE INSTRUCTIONS Take these actions to decrease your discomfort and to help avoid complications. Diet  Follow a diet as recommended by your health care provider. This may involve avoiding foods and drinks such as:  Coffee and tea (with or without caffeine).  Drinks that contain alcohol.  Energy drinks and sports drinks.  Carbonated drinks or sodas.  Chocolate and cocoa.  Peppermint and mint flavorings.  Garlic and onions.  Horseradish.  Spicy and acidic foods, including peppers, chili powder, curry powder, vinegar, hot sauces, and barbecue sauce.  Citrus fruit juices and citrus fruits, such as oranges, lemons, and limes.  Tomato-based foods, such as red sauce, chili, salsa, and pizza with red sauce.  Fried and fatty foods, such as donuts, french fries, potato chips, and high-fat dressings.  High-fat meats, such as hot dogs and fatty cuts of red and white meats, such as rib eye steak, sausage, ham, and bacon.  High-fat dairy items, such as whole milk, butter, and cream cheese.  Eat small, frequent meals instead of large meals.  Avoid drinking  large amounts of liquid with your meals.  Avoid eating meals during the 2-3 hours before bedtime.  Avoid lying down right after you eat.  Do not exercise right after you eat. General Instructions  Pay attention to any changes in your symptoms.  Take over-the-counter and prescription medicines only as told by your health care provider. Do not take aspirin, ibuprofen, or other NSAIDs unless your health care provider told you to do so.  Do not use any tobacco products, including cigarettes, chewing tobacco, and e-cigarettes. If you need help quitting, ask your health care provider.  Wear loose-fitting clothing. Do not wear anything tight  around your waist that causes pressure on your abdomen.  Raise (elevate) the head of your bed about 6 inches (15 cm).  Try to reduce your stress, such as with yoga or meditation. If you need help reducing stress, ask your health care provider.  If you are overweight, reduce your weight to an amount that is healthy for you. Ask your health care provider for guidance about a safe weight loss goal.  Keep all follow-up visits as told by your health care provider. This is important. SEEK MEDICAL CARE IF:  You have new symptoms.  You have unexplained weight loss.  You have difficulty swallowing, or it hurts to swallow.  You have wheezing or a persistent cough.  Your symptoms do not improve with treatment.  You have frequent heartburn for more than two weeks. SEEK IMMEDIATE MEDICAL CARE IF:  You have pain in your arms, neck, jaw, teeth, or back.  You feel sweaty, dizzy, or light-headed.  You have chest pain or shortness of breath.  You vomit and your vomit looks like blood or coffee grounds.  Your stool is bloody or black.   This information is not intended to replace advice given to you by your health care provider. Make sure you discuss any questions you have with your health care provider.   Document Released: 03/08/2009 Document Revised: 07/11/2015 Document Reviewed: 02/14/2015 Elsevier Interactive Patient Education [employer]2016 [employer]

## 2015-10-15 ENCOUNTER — Ambulatory Visit: Payer: Medicare Other | Admitting: Family Medicine

## 2015-10-19 DIAGNOSIS — J449 Chronic obstructive pulmonary disease, unspecified: Secondary | ICD-10-CM | POA: Diagnosis not present

## 2015-10-19 DIAGNOSIS — I720 Aneurysm of carotid artery: Secondary | ICD-10-CM | POA: Diagnosis not present

## 2015-11-19 DIAGNOSIS — I720 Aneurysm of carotid artery: Secondary | ICD-10-CM | POA: Diagnosis not present

## 2015-11-19 DIAGNOSIS — J449 Chronic obstructive pulmonary disease, unspecified: Secondary | ICD-10-CM | POA: Diagnosis not present

## 2015-12-17 ENCOUNTER — Ambulatory Visit (INDEPENDENT_AMBULATORY_CARE_PROVIDER_SITE_OTHER): Payer: Medicare Other | Admitting: Family Medicine

## 2015-12-17 ENCOUNTER — Encounter: Payer: Self-pay | Admitting: Family Medicine

## 2015-12-17 VITALS — BP 165/90 | HR 72 | Resp 16 | Ht 72.0 in | Wt 247.8 lb

## 2015-12-17 DIAGNOSIS — I10 Essential (primary) hypertension: Secondary | ICD-10-CM

## 2015-12-17 MED ORDER — METOPROLOL TARTRATE 100 MG PO TABS: ORAL_TABLET | ORAL | Status: DC

## 2015-12-17 NOTE — Progress Notes (Signed)
Name: Eric Lawrence   MRN: [id number]    DOB: Mar 13, 1966   Date:12/17/2015       Progress Note  Subjective  Chief Complaint  Chief Complaint  Patient presents with  . Hypertension    HPI Here for f/u of HBP.  Says he is taking meds, but has not taken this AM.  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  No problem-specific assessment & plan notes found for this encounter.   Past Medical History  Diagnosis Date  . COPD (chronic obstructive pulmonary disease) ([place])   . Anxiety   . Depression   . Cancer [facility])     Past Surgical History  Procedure Laterality Date  . Cyst removal from wrist      Family History  Problem Relation Age of Onset  . Stroke Mother 62  . Diabetes Mother   . Kidney disease Mother   . Hypertension Mother   . COPD Maternal Aunt   . Diabetes Maternal Grandmother     Social History   Social History  . Marital Status: Single    Spouse Name: N/A  . Number of Children: N/A  . Years of Education: N/A   Occupational History  . Not on file.   Social History Main Topics  . Smoking status: Current Every Day Smoker -- 0.25 packs/day    Types: Cigarettes  . Smokeless tobacco: Never Used  . Alcohol Use: No  . Drug Use: No  . Sexual Activity: Not on file   Other Topics Concern  . Not on file   Social History Narrative     Current outpatient prescriptions:  .  acetaminophen (TYLENOL) 500 MG tablet, Take 500 mg by mouth every 6 (six) hours as needed., Disp: , Rfl:  .  albuterol (PROVENTIL HFA;VENTOLIN HFA) 108 (90 BASE) MCG/ACT inhaler, Inhale into the lungs every 6 (six) hours as needed for wheezing or shortness of breath., Disp: , Rfl:  .  AMLODIPINE BESYLATE PO, Take 10 mg by mouth., Disp: , Rfl:  .  co-enzyme Q-10 30 MG capsule, Take 30 mg by mouth 3 (three) times daily., Disp: , Rfl:  .  hydrochlorothiazide (HYDRODIURIL) 25 MG tablet, Take 1 tablet (25 mg total) by mouth daily., Disp: 90 tablet, Rfl: 3 .  lisinopril  (PRINIVIL,ZESTRIL) 40 MG tablet, Take 1 tablet (40 mg total) by mouth daily., Disp: 90 tablet, Rfl: 3 .  metoprolol (LOPRESSOR) 100 MG tablet, Take 1.5 tablets twice a day, Disp: 90 tablet, Rfl: 12 .  multivitamin-lutein (OCUVITE-LUTEIN) CAPS capsule, Take 1 capsule by mouth daily., Disp: , Rfl:  .  omega-3 acid ethyl esters (LOVAZA) 1 G capsule, Take by mouth 2 (two) times daily., Disp: , Rfl:  .  omeprazole (PRILOSEC) 20 MG capsule, Take 1 capsule (20 mg total) by mouth daily., Disp: 30 capsule, Rfl: 12 .  OXYGEN, Inhale into the lungs., Disp: , Rfl:  .  sucralfate (CARAFATE) 1 G tablet, Take 1 tablet (1 g total) by mouth 2 (two) times daily., Disp: 20 tablet, Rfl: 0  Not on File   Review of Systems  Constitutional: Negative for fever, chills, weight loss and malaise/fatigue.  HENT: Negative for hearing loss.   Eyes: Negative for blurred vision and double vision.  Respiratory: Negative for cough, shortness of breath and wheezing.   Cardiovascular: Negative for chest pain, palpitations and leg swelling.  Gastrointestinal: Negative for abdominal pain and blood in stool.  Genitourinary: Negative for dysuria, urgency and frequency.  Musculoskeletal: Negative  for myalgias and joint pain.  Skin: Negative for rash.  Neurological: Negative for dizziness, tremors, weakness and headaches.  Psychiatric/Behavioral: Positive for depression. The patient is not nervous/anxious and does not have insomnia.       Objective  Filed Vitals:   12/17/15 1057 12/17/15 1202  BP: 167/104 165/90  Pulse: 72   Resp: 16   Height: 6' (1.829 m)   Weight: 247 lb 12.8 oz (112.401 kg)     Physical Exam  Constitutional: He is oriented to person, place, and time and well-developed, well-nourished, and in no distress. No distress.  HENT:  Head: Normocephalic and atraumatic.  Eyes: Conjunctivae are normal. Pupils are equal, round, and reactive to light. No scleral icterus.  Neck: Normal range of motion. Neck  supple. Carotid bruit is not present. No thyromegaly present.  Cardiovascular: Normal rate, regular rhythm and normal heart sounds.   Occasional extrasystoles are present. Exam reveals no gallop and no friction rub.   No murmur heard. Pulmonary/Chest: Effort normal and breath sounds normal. No respiratory distress. He has no wheezes. He has no rales.  Musculoskeletal: He exhibits no edema.  Lymphadenopathy:    He has no cervical adenopathy.  Neurological: He is alert and oriented to person, place, and time.       Recent Results (from the past 2160 hour(s))  Troponin I     Status: None   Collection Time: 10/09/15  2:14 AM  Result Value Ref Range   Troponin I <0.03 <0.031 ng/mL    Comment:        NO INDICATION OF MYOCARDIAL INJURY.   CBC     Status: None   Collection Time: 10/09/15  2:14 AM  Result Value Ref Range   WBC 8.6 3.8 - 10.6 K/uL   RBC 5.17 4.40 - 5.90 MIL/uL   Hemoglobin 16.0 13.0 - 18.0 g/dL   HCT 47.3 40.0 - 52.0 %   MCV 91.5 80.0 - 100.0 fL   MCH 31.0 26.0 - 34.0 pg   MCHC 33.9 32.0 - 36.0 g/dL   RDW 13.7 11.5 - 14.5 %   Platelets 162 150 - 440 K/uL  Comprehensive metabolic panel     Status: Abnormal   Collection Time: 10/09/15  2:14 AM  Result Value Ref Range   Sodium 144 135 - 145 mmol/L   Potassium 4.0 3.5 - 5.1 mmol/L   Chloride 106 101 - 111 mmol/L   CO2 31 22 - 32 mmol/L   Glucose, Bld 159 (H) 65 - 99 mg/dL   BUN 17 6 - 20 mg/dL   Creatinine, Ser 1.03 0.61 - 1.24 mg/dL   Calcium 9.2 8.9 - 10.3 mg/dL   Total Protein 7.0 6.5 - 8.1 g/dL   Albumin 4.0 3.5 - 5.0 g/dL   AST 16 15 - 41 U/L   ALT 19 17 - 63 U/L   Alkaline Phosphatase 79 38 - 126 U/L   Total Bilirubin 0.3 0.3 - 1.2 mg/dL   GFR calc non Af Amer >60 >60 mL/min   GFR calc Af Amer >60 >60 mL/min    Comment: (NOTE) The eGFR has been calculated using the CKD EPI equation. This calculation has not been validated in all clinical situations. eGFR's persistently <60 mL/min signify possible  Chronic Kidney Disease.    Anion gap 7 5 - 15  Troponin I     Status: None   Collection Time: 10/09/15  6:29 AM  Result Value Ref Range   Troponin I <0.03 <0.031  ng/mL    Comment:        NO INDICATION OF MYOCARDIAL INJURY.      Assessment & Plan  Problem List Items Addressed This Visit      Cardiovascular and Mediastinum   BP (high blood pressure) - Primary   Relevant Medications   metoprolol (LOPRESSOR) 100 MG tablet      Meds ordered this encounter  Medications  . metoprolol (LOPRESSOR) 100 MG tablet    Sig: Take 1.5 tablets twice a day    Dispense:  90 tablet    Refill:  12   1. Essential hypertension  - metoprolol (LOPRESSOR) 100 MG tablet; Take 1.5 tablets twice a day  Dispense: 90 tablet; Refill: 12- Increased from 1 tablet twice a day..  -Cont. Amlodipine, 10 mg/d. Lisinopril 40 mg/d, and HCTZ, 25 mg/d. -discussed totally stopping Smoking.

## 2015-12-20 DIAGNOSIS — I720 Aneurysm of carotid artery: Secondary | ICD-10-CM | POA: Diagnosis not present

## 2015-12-20 DIAGNOSIS — J449 Chronic obstructive pulmonary disease, unspecified: Secondary | ICD-10-CM | POA: Diagnosis not present

## 2016-01-17 DIAGNOSIS — J449 Chronic obstructive pulmonary disease, unspecified: Secondary | ICD-10-CM | POA: Diagnosis not present

## 2016-01-17 DIAGNOSIS — I720 Aneurysm of carotid artery: Secondary | ICD-10-CM | POA: Diagnosis not present

## 2016-02-17 DIAGNOSIS — J449 Chronic obstructive pulmonary disease, unspecified: Secondary | ICD-10-CM | POA: Diagnosis not present

## 2016-02-17 DIAGNOSIS — I720 Aneurysm of carotid artery: Secondary | ICD-10-CM | POA: Diagnosis not present

## 2016-02-26 ENCOUNTER — Ambulatory Visit: Payer: Medicare Other | Admitting: Family Medicine

## 2016-03-18 DIAGNOSIS — J449 Chronic obstructive pulmonary disease, unspecified: Secondary | ICD-10-CM | POA: Diagnosis not present

## 2016-03-18 DIAGNOSIS — I720 Aneurysm of carotid artery: Secondary | ICD-10-CM | POA: Diagnosis not present

## 2016-04-18 DIAGNOSIS — I720 Aneurysm of carotid artery: Secondary | ICD-10-CM | POA: Diagnosis not present

## 2016-04-18 DIAGNOSIS — J449 Chronic obstructive pulmonary disease, unspecified: Secondary | ICD-10-CM | POA: Diagnosis not present

## 2016-05-18 DIAGNOSIS — I720 Aneurysm of carotid artery: Secondary | ICD-10-CM | POA: Diagnosis not present

## 2016-05-18 DIAGNOSIS — J449 Chronic obstructive pulmonary disease, unspecified: Secondary | ICD-10-CM | POA: Diagnosis not present

## 2016-06-18 DIAGNOSIS — I720 Aneurysm of carotid artery: Secondary | ICD-10-CM | POA: Diagnosis not present

## 2016-06-18 DIAGNOSIS — J449 Chronic obstructive pulmonary disease, unspecified: Secondary | ICD-10-CM | POA: Diagnosis not present

## 2016-06-24 ENCOUNTER — Encounter: Payer: Self-pay | Admitting: Family Medicine

## 2016-06-24 ENCOUNTER — Ambulatory Visit (INDEPENDENT_AMBULATORY_CARE_PROVIDER_SITE_OTHER): Payer: Medicare Other | Admitting: Family Medicine

## 2016-06-24 VITALS — BP 180/100 | HR 83 | Temp 98.2°F | Resp 16 | Ht 72.0 in | Wt 249.0 lb

## 2016-06-24 DIAGNOSIS — I10 Essential (primary) hypertension: Secondary | ICD-10-CM | POA: Diagnosis not present

## 2016-06-24 DIAGNOSIS — F17200 Nicotine dependence, unspecified, uncomplicated: Secondary | ICD-10-CM | POA: Diagnosis not present

## 2016-06-24 DIAGNOSIS — J410 Simple chronic bronchitis: Secondary | ICD-10-CM | POA: Diagnosis not present

## 2016-06-24 MED ORDER — METOPROLOL TARTRATE 100 MG PO TABS: ORAL_TABLET | ORAL | 12 refills | Status: DC

## 2016-06-24 MED ORDER — LISINOPRIL 40 MG PO TABS: 40.0000 mg | ORAL_TABLET | Freq: Every day | ORAL | 3 refills | Status: DC

## 2016-06-24 MED ORDER — ALBUTEROL SULFATE HFA 108 (90 BASE) MCG/ACT IN AERS: 2.0000 | INHALATION_SPRAY | Freq: Four times a day (QID) | RESPIRATORY_TRACT | 4 refills | Status: DC | PRN

## 2016-06-24 MED ORDER — AMLODIPINE BESYLATE 10 MG PO TABS: 10.0000 mg | ORAL_TABLET | Freq: Every day | ORAL | 3 refills | Status: DC

## 2016-06-24 NOTE — Progress Notes (Signed)
Name: Eric Lawrence   MRN: [id number]    DOB: 01/12/1966   Date:06/24/2016       Progress Note  Subjective  Chief Complaint  Chief Complaint  Patient presents with  . Hypertension  . Asthma    HPI Here for f/u of HBP.  Also with asthma that is getting worse b/o continued smoking.  He had left off some of his BP meds.  Went out os town and forgot them  No problem-specific Assessment & Plan notes found for this encounter.   Past Medical History:  Diagnosis Date  . Anxiety   . Cancer ([facility])   . COPD (chronic obstructive pulmonary disease) ([facility])   . Depression     Past Surgical History:  Procedure Laterality Date  . cyst removal from wrist      Family History  Problem Relation Age of Onset  . Stroke Mother 7965  . Diabetes Mother   . Kidney disease Mother   . Hypertension Mother   . COPD Maternal Aunt   . Diabetes Maternal Grandmother     Social History   Social History  . Marital status: Single    Spouse name: N/A  . Number of children: N/A  . Years of education: N/A   Occupational History  . Not on file.   Social History Main Topics  . Smoking status: Current Every Day Smoker    Packs/day: 0.25    Types: Cigarettes  . Smokeless tobacco: Never Used  . Alcohol use No  . Drug use: No  . Sexual activity: Not on file   Other Topics Concern  . Not on file   Social History Narrative  . No narrative on file     Current Outpatient Prescriptions:  .  albuterol (PROVENTIL HFA;VENTOLIN HFA) 108 (90 Base) MCG/ACT inhaler, Inhale 2 puffs into the lungs every 6 (six) hours as needed for wheezing or shortness of breath., Disp: 3 Inhaler, Rfl: 4 .  acetaminophen (TYLENOL) 500 MG tablet, Take 500 mg by mouth every 6 (six) hours as needed., Disp: , Rfl:  .  amLODipine (NORVASC) 10 MG tablet, Take 1 tablet (10 mg total) by mouth daily., Disp: 90 tablet, Rfl: 3 .  co-enzyme Q-10 30 MG capsule, Take 30 mg by mouth 3 (three) times daily., Disp: , Rfl:  .   hydrochlorothiazide (HYDRODIURIL) 25 MG tablet, Take 1 tablet (25 mg total) by mouth daily. (Patient not taking: Reported on 06/24/2016), Disp: 90 tablet, Rfl: 3 .  lisinopril (PRINIVIL,ZESTRIL) 40 MG tablet, Take 1 tablet (40 mg total) by mouth daily., Disp: 90 tablet, Rfl: 3 .  metoprolol (LOPRESSOR) 100 MG tablet, Take 1.5 tablets twice a day, Disp: 90 tablet, Rfl: 12 .  omega-3 acid ethyl esters (LOVAZA) 1 G capsule, Take by mouth 2 (two) times daily., Disp: , Rfl:  .  omeprazole (PRILOSEC) 20 MG capsule, Take 1 capsule (20 mg total) by mouth daily. (Patient taking differently: Take 20 mg by mouth daily as needed. ), Disp: 30 capsule, Rfl: 12 .  OXYGEN, Inhale into the lungs., Disp: , Rfl:  .  sucralfate (CARAFATE) 1 G tablet, Take 1 tablet (1 g total) by mouth 2 (two) times daily. (Patient not taking: Reported on 06/24/2016), Disp: 20 tablet, Rfl: 0  No Known Allergies   Review of Systems  Constitutional: Negative for chills, diaphoresis, fever and weight loss.  HENT: Negative for hearing loss.   Eyes: Negative for blurred vision and double vision.  Respiratory: Positive for shortness of  breath (with exercise). Negative for cough and wheezing.   Cardiovascular: Negative for chest pain, palpitations and leg swelling.  Gastrointestinal: Negative for abdominal pain, blood in stool and heartburn.  Genitourinary: Negative for dysuria, frequency and urgency.  Skin: Negative for itching and rash.  Neurological: Negative for dizziness, tremors, weakness and headaches.  Psychiatric/Behavioral: Negative for depression.      Objective  Vitals:   06/24/16 1005 06/24/16 1033  BP: (!) 169/111 (!) 180/100  Pulse: 83   Resp: 16   Temp: 98.2 F (36.8 C)   TempSrc: Oral   Weight: 249 lb (112.9 kg)   Height: 6' (1.829 m)     Physical Exam  Constitutional: He is oriented to person, place, and time and well-developed, well-nourished, and in no distress. No distress.  HENT:  Head:  Normocephalic and atraumatic.  Eyes: Conjunctivae and EOM are normal. Pupils are equal, round, and reactive to light. No scleral icterus.  Neck: Normal range of motion. Neck supple. Carotid bruit is not present. No thyromegaly present.  Cardiovascular: Normal rate, regular rhythm and normal heart sounds.  Exam reveals no gallop and no friction rub.   No murmur heard. Pulmonary/Chest: Effort normal and breath sounds normal. No respiratory distress. He has no wheezes. He has no rales.  Musculoskeletal: He exhibits no edema.  Lymphadenopathy:    He has no cervical adenopathy.  Neurological: He is alert and oriented to person, place, and time.  Vitals reviewed.      No results found for this or any previous visit (from the past 2160 hour(s)).   Assessment & Plan  Problem List Items Addressed This Visit      Cardiovascular and Mediastinum   BP (high blood pressure) - Primary   Relevant Medications   metoprolol (LOPRESSOR) 100 MG tablet   lisinopril (PRINIVIL,ZESTRIL) 40 MG tablet   amLODipine (NORVASC) 10 MG tablet     Respiratory   CAFL (chronic airflow limitation) (HCC)   Relevant Medications   albuterol (PROVENTIL HFA;VENTOLIN HFA) 108 (90 Base) MCG/ACT inhaler     Other   Compulsive tobacco user syndrome    Other Visit Diagnoses   None.     Meds ordered this encounter  Medications  . metoprolol (LOPRESSOR) 100 MG tablet    Sig: Take 1.5 tablets twice a day    Dispense:  90 tablet    Refill:  12  . lisinopril (PRINIVIL,ZESTRIL) 40 MG tablet    Sig: Take 1 tablet (40 mg total) by mouth daily.    Dispense:  90 tablet    Refill:  3  . amLODipine (NORVASC) 10 MG tablet    Sig: Take 1 tablet (10 mg total) by mouth daily.    Dispense:  90 tablet    Refill:  3  . albuterol (PROVENTIL HFA;VENTOLIN HFA) 108 (90 Base) MCG/ACT inhaler    Sig: Inhale 2 puffs into the lungs every 6 (six) hours as needed for wheezing or shortness of breath.    Dispense:  3 Inhaler     Refill:  4    1. Essential hypertension Restart all meds - metoprolol (LOPRESSOR) 100 MG tablet; Take 1.5 tablets twice a day  Dispense: 90 tablet; Refill: 12 - lisinopril (PRINIVIL,ZESTRIL) 40 MG tablet; Take 1 tablet (40 mg total) by mouth daily.  Dispense: 90 tablet; Refill: 3 - amLODipine (NORVASC) 10 MG tablet; Take 1 tablet (10 mg total) by mouth daily.  Dispense: 90 tablet; Refill: 3  2. Simple chronic bronchitis ([facility]CC)  - albuterol (  PROVENTIL HFA;VENTOLIN HFA) 108 (90 Base) MCG/ACT inhaler; Inhale 2 puffs into the lungs every 6 (six) hours as needed for wheezing or shortness of breath.  Dispense: 3 Inhaler; Refill: 4  3. Compulsive tobacco user syndrome

## 2016-07-19 DIAGNOSIS — I720 Aneurysm of carotid artery: Secondary | ICD-10-CM | POA: Diagnosis not present

## 2016-07-19 DIAGNOSIS — J449 Chronic obstructive pulmonary disease, unspecified: Secondary | ICD-10-CM | POA: Diagnosis not present

## 2016-08-18 DIAGNOSIS — J449 Chronic obstructive pulmonary disease, unspecified: Secondary | ICD-10-CM | POA: Diagnosis not present

## 2016-08-18 DIAGNOSIS — I720 Aneurysm of carotid artery: Secondary | ICD-10-CM | POA: Diagnosis not present

## 2016-08-25 ENCOUNTER — Ambulatory Visit: Payer: Medicare Other | Admitting: Family Medicine

## 2016-09-18 DIAGNOSIS — J449 Chronic obstructive pulmonary disease, unspecified: Secondary | ICD-10-CM | POA: Diagnosis not present

## 2016-09-18 DIAGNOSIS — I720 Aneurysm of carotid artery: Secondary | ICD-10-CM | POA: Diagnosis not present

## 2016-10-18 DIAGNOSIS — J449 Chronic obstructive pulmonary disease, unspecified: Secondary | ICD-10-CM | POA: Diagnosis not present

## 2016-10-18 DIAGNOSIS — I720 Aneurysm of carotid artery: Secondary | ICD-10-CM | POA: Diagnosis not present

## 2016-11-18 DIAGNOSIS — J449 Chronic obstructive pulmonary disease, unspecified: Secondary | ICD-10-CM | POA: Diagnosis not present

## 2016-11-18 DIAGNOSIS — I720 Aneurysm of carotid artery: Secondary | ICD-10-CM | POA: Diagnosis not present

## 2016-12-19 DIAGNOSIS — I720 Aneurysm of carotid artery: Secondary | ICD-10-CM | POA: Diagnosis not present

## 2016-12-19 DIAGNOSIS — J449 Chronic obstructive pulmonary disease, unspecified: Secondary | ICD-10-CM | POA: Diagnosis not present

## 2017-01-16 DIAGNOSIS — I720 Aneurysm of carotid artery: Secondary | ICD-10-CM | POA: Diagnosis not present

## 2017-01-16 DIAGNOSIS — J449 Chronic obstructive pulmonary disease, unspecified: Secondary | ICD-10-CM | POA: Diagnosis not present

## 2017-02-16 DIAGNOSIS — I720 Aneurysm of carotid artery: Secondary | ICD-10-CM | POA: Diagnosis not present

## 2017-02-16 DIAGNOSIS — J449 Chronic obstructive pulmonary disease, unspecified: Secondary | ICD-10-CM | POA: Diagnosis not present

## 2017-02-20 ENCOUNTER — Encounter: Payer: Self-pay | Admitting: Emergency Medicine

## 2017-02-20 DIAGNOSIS — I10 Essential (primary) hypertension: Secondary | ICD-10-CM | POA: Insufficient documentation

## 2017-02-20 DIAGNOSIS — F17210 Nicotine dependence, cigarettes, uncomplicated: Secondary | ICD-10-CM | POA: Diagnosis not present

## 2017-02-20 DIAGNOSIS — M7989 Other specified soft tissue disorders: Secondary | ICD-10-CM | POA: Insufficient documentation

## 2017-02-20 DIAGNOSIS — J449 Chronic obstructive pulmonary disease, unspecified: Secondary | ICD-10-CM | POA: Insufficient documentation

## 2017-02-20 DIAGNOSIS — Z5321 Procedure and treatment not carried out due to patient leaving prior to being seen by health care provider: Secondary | ICD-10-CM | POA: Diagnosis not present

## 2017-02-20 NOTE — ED Triage Notes (Signed)
Pt is ambulatory to triage with c/o left hand swelling. Pt states that he has a "bump" that is getting bigger on his left hand at the base of his thumb. Pt is in NAD at this time.

## 2017-02-21 ENCOUNTER — Emergency Department
Admission: EM | Admit: 2017-02-21 | Discharge: 2017-02-21 | Disposition: A | Discharge status: Home / Self Care | Payer: Medicare Other | Attending: Emergency Medicine | Admitting: Emergency Medicine

## 2017-02-21 HISTORY — DX: Essential (primary) hypertension: I10

## 2017-02-21 NOTE — ED Notes (Signed)
Pt called to go back to room, no answer. Waiting room searched for pt, including outside, pt not seen.

## 2017-04-15 ENCOUNTER — Ambulatory Visit
Admission: RE | Admit: 2017-04-15 | Discharge: 2017-04-15 | Disposition: A | Discharge status: Home / Self Care | Payer: Medicare Other | Source: Ambulatory Visit | Attending: Family Medicine | Admitting: Family Medicine

## 2017-04-15 ENCOUNTER — Ambulatory Visit (INDEPENDENT_AMBULATORY_CARE_PROVIDER_SITE_OTHER): Payer: Medicare Other | Admitting: Family Medicine

## 2017-04-15 ENCOUNTER — Encounter: Payer: Self-pay | Admitting: Family Medicine

## 2017-04-15 VITALS — BP 184/103 | HR 85 | Temp 98.5°F | Resp 16 | Ht 72.0 in | Wt 258.0 lb

## 2017-04-15 DIAGNOSIS — M79645 Pain in left finger(s): Secondary | ICD-10-CM | POA: Diagnosis not present

## 2017-04-15 DIAGNOSIS — G8929 Other chronic pain: Secondary | ICD-10-CM | POA: Diagnosis not present

## 2017-04-15 DIAGNOSIS — M65312 Trigger thumb, left thumb: Secondary | ICD-10-CM

## 2017-04-15 DIAGNOSIS — I10 Essential (primary) hypertension: Secondary | ICD-10-CM | POA: Diagnosis not present

## 2017-04-15 HISTORY — DX: Trigger thumb, left thumb: M65.312

## 2017-04-15 MED ORDER — LISINOPRIL 40 MG PO TABS: 40.0000 mg | ORAL_TABLET | Freq: Every day | ORAL | 1 refills | Status: DC

## 2017-04-15 MED ORDER — METOPROLOL TARTRATE 100 MG PO TABS: 100.0000 mg | ORAL_TABLET | Freq: Two times a day (BID) | ORAL | 1 refills | Status: DC

## 2017-04-15 MED ORDER — HYDROCHLOROTHIAZIDE 25 MG PO TABS: 25.0000 mg | ORAL_TABLET | Freq: Every day | ORAL | 1 refills | Status: DC

## 2017-04-15 MED ORDER — AMLODIPINE BESYLATE 10 MG PO TABS: 10.0000 mg | ORAL_TABLET | Freq: Every day | ORAL | 1 refills | Status: DC

## 2017-04-15 NOTE — Assessment & Plan Note (Signed)
Uncontrolled HTN, off of all medications for >6 months, non adherence. - Home BP readings none  No known complications - last labs 2016   Plan:  1. Refill all prior anti-HTN meds today - Amlodipine 10mg  daily, HCTZ 25mg  daily, Lisinopril 40mg  2. Except refilled lower dose Metoprolol 100mg  BID - concern with 150mg  BID 3. Encourage improved lifestyle - low sodium diet, regular exercise 4. Continue monitor BP outside office at pharmacy, bring readings to next visit, if persistently >140/90 or new symptoms notify office sooner 5. Follow-up 3 months BP - discuss labs and other follow-up

## 2017-04-15 NOTE — Patient Instructions (Addendum)
Thank you for coming to the clinic today.  1. I am concerned you may have had Left Trigger Thumb with stuck tendons limiting your flexion, this may have gradually worsened over time  Try to avoid extra strain and stress on this thumb, avoid heavy lifting  May continue thumb spica splint support for activities  Try topical muscle rub or aspercreme for pain relief  Recommend to start taking Tylenol Extra Strength 500mg  tabs - take 1 to 2 tabs per dose (max 1000mg ) every 6-8 hours for pain (take regularly, don't skip a dose for next 7 days), max 24 hour daily dose is 6 tablets or 3000mg . In the future you can repeat the same everyday Tylenol course for 1-2 weeks at a time.   Use RICE therapy: - R - Rest / relative rest with activity modification avoid overuse of joint - I - Ice packs (make sure you use a towel or sock / something to protect skin) - C - Compression to apply pressure and reduce swelling allowing more support - E - Elevation - if significant swelling, lift leg above heart level (toes above your nose) to help reduce swelling, most helpful at night after day of being on your feet  X-ray today of Left thumb will notify you of results within 24-48 hours.  [facility] [address] [place], [place]  [id number] Phone: [phone]  They may get you to specialist for hand in future  Please schedule a Follow-up Appointment to: Return in about 3 months (around 07/16/2017) for blood pressure.  If you have any other questions or concerns, please feel free to call the clinic or send a message through MyChart. You may also schedule an earlier appointment if necessary.  Saralyn PilarAlexander Karamalegos, DO [facility]

## 2017-04-15 NOTE — Progress Notes (Addendum)
Subjective:    Patient ID: Eric Lawrence, male    DOB: 1966-10-05, 51 y.o.   MRN: [id number]  Eric Lawrence is a 52 y.o. male presenting on 04/15/2017 for Hand Pain (left side thumb onset 4 month as per pt can't bend his thumb byitself pt had never refill meds from past 6 month so without meds)   HPI   Left Thumb Pain / Limited ROM - He reports symptoms started gradual worsening Left thumb only with some difficulty flexion at thumb finger joint and limited flexion base of thumb joint, no known injury or trauma. 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 - Now worsening problem over past 4 months now, difficulty with stiffness and some "locking" and then he could force it back up to pop it back into place, he does have some pain and localized swelling, and feels a cyst or knot in base of thumb. Does not have significant pain at rest, but has noticeable decreased function and difficulty with certain tasks and some pain with flexing thumb and lifting - Additionally he was seen in [facility] ED triage but left due to wait and did not see physician. - Tried thumb spica splint periodically - Not tried any NSAIDs, or OTC Tylenol - He is Right handed - No known prior diagnosis of arthritis or significant imaging in past - Additionally admits to history of GERD and possible "ulcer", he can tolerate Ibuprofen Advil but does not take regularly - Admits mild swelling of base of L thumb - Denies any laceration or other injury to thumb or wrist. No prior significant joint pain or swelling or problems  CHRONIC HTN: Reports he has been out of medications BP meds for >6 months, requesting refills today, He states one of these medications, he never actually started. Unsure which. Current Meds - previously taking Amlodipine 10mg  daily, HCTZ 25mg  daily,  Lisinopril 40mg , Metoprolol 100 to 150mg  BID Reports good compliance when he has meds, otherwise poor adherence to follow-up and lifestyle changes Lifestyle - Reducing smoking - Tries to limit sodium intake Denies CP, dyspnea, HA, edema, dizziness / lightheadedness  PMH - Anxiety, Depression, followed by Dr Lynett Fish [facility] - Psychiatry)  Depression screen [facility] 2/9 04/16/2017 12/17/2015 09/10/2015  Decreased Interest 1 0 0  Down, Depressed, Hopeless 2 0 1  PHQ - 2 Score 3 0 1  Altered sleeping 2 - 0  Tired, decreased energy 3 - 2  Change in appetite 1 - 0  Feeling bad or failure about yourself  3 - 3  Trouble concentrating 2 - 2  Moving slowly or fidgety/restless 3 - 3  Suicidal thoughts 0 - 1  PHQ-9 Score 17 - 12  Difficult doing work/chores - - Somewhat difficult   GAD 7 : Generalized Anxiety Score 04/15/2017  Nervous, Anxious, on Edge 3  Control/stop worrying 3  Worry too much - different things 3  Trouble relaxing 2  Restless 2  Easily annoyed or irritable 2  Afraid - awful might happen 2  Total GAD 7 Score 17  Anxiety Difficulty Somewhat difficult     Social History  Substance Use Topics  . Smoking status: Current Every Day Smoker    Packs/day: 0.25    Types: Cigarettes  . Smokeless tobacco: Never Used  . Alcohol use No    Review of Systems  Per HPI unless specifically indicated above     Objective:    BP (!) 184/103   Pulse 85   Temp 98.5 F (36.9 C) (Oral)   Resp 16   Ht 6' (1.829 m)   Wt 258 lb (117 kg)   BMI 34.99 kg/m   Wt Readings from Last 3 Encounters:  04/15/17 258 lb (117 kg)  02/20/17 270 lb (122.5 kg)  06/24/16 249 lb (112.9 kg)    Physical Exam  Constitutional: He is oriented to person, place, and time. He appears well-developed and well-nourished. No distress.  Well-appearing, comfortable, cooperative  HENT:  Head: Normocephalic and atraumatic.  Mouth/Throat: Oropharynx is clear and moist.  Eyes: Conjunctivae are  normal. Right eye exhibits no discharge. Left eye exhibits no discharge.  Neck: Normal range of motion. Neck supple.  Cardiovascular: Normal rate, regular rhythm, normal heart sounds and intact distal pulses.   No murmur heard. Pulmonary/Chest: Effort normal and breath sounds normal. No respiratory distress. He has no wheezes. He has no rales.  Musculoskeletal: He exhibits no edema.  Left Hand/Wrist Inspection: Slightly abnormal appearance to Left base of thumb palmar aspect with minimal redness without significant erythema, residual edema with some bulkiness of CMC joint compared to R. No ecchymosis. No deformity. No other joint problem. - Old linear incisional scar well healed on dorsal wrist from old surgery 1990 Palpation: Non tender hand / wrist, carpal bones, MCP. Mild discomfort without significant tenderness to base of thumb. No distinct anatomical snuff box or scaphoid tenderness. Non tender over APL / EPB tendons radially. ROM: full active wrist ROM flex / ext, ulnar / radial deviation. Very limited L thumb ROM able to move at [facility] flex/ext, but cannot move PIP flexion actively, his CMC flexion is somewhat limited, mild discomfort with this. No locking or catching. Special Testing: Finkelstein's test negative for pain. Negative Tinel's median nerve Strength: 5/5 grip without thumb, unable to perform thumb opposition, wrist flex/ext Neurovascular: distally intact  Neurological: He is alert and oriented to person, place, and time. No cranial nerve deficit.  Skin: Skin is warm and dry. No rash noted. He is not diaphoretic. No erythema.  Psychiatric: He has a normal mood and affect. His behavior is normal.  Good eye contact, normal speech and thoughts  Nursing note and vitals reviewed.  I have personally reviewed the radiology report from Left Thumb X-ray on 04/15/17.  CLINICAL DATA:  Trigger thumb left pain with chronic pain  EXAM: LEFT THUMB 2+V  COMPARISON:   None.  FINDINGS: There is no evidence of fracture or dislocation. There is no evidence of arthropathy or other focal bone abnormality. Soft tissues are unremarkable  IMPRESSION: Negative.   Electronically Signed   By: Marlan Palau M.D.   On: 04/15/2017 12:52  Results for orders placed or performed during the hospital encounter of 10/09/15  Troponin I  Result Value Ref Range   Troponin I <0.03 <0.031 ng/mL  CBC  Result Value Ref Range   WBC 8.6 3.8 - 10.6 K/uL   RBC 5.17 4.40 - 5.90 MIL/uL   Hemoglobin 16.0 13.0 - 18.0 g/dL   HCT 40.9 81.1 - 91.4 %   MCV 91.5 80.0 - 100.0 fL   MCH 31.0 26.0 - 34.0 pg   MCHC 33.9 32.0 - 36.0 g/dL   RDW 78.2 95.6 - 21.3 %   Platelets 162 150 - 440 K/uL  Comprehensive metabolic panel  Result Value Ref Range   Sodium 144 135 - 145  mmol/L   Potassium 4.0 3.5 - 5.1 mmol/L   Chloride 106 101 - 111 mmol/L   CO2 31 22 - 32 mmol/L   Glucose, Bld 159 (H) 65 - 99 mg/dL   BUN 17 6 - 20 mg/dL   Creatinine, Ser 1.61 0.61 - 1.24 mg/dL   Calcium 9.2 8.9 - 09.6 mg/dL   Total Protein 7.0 6.5 - 8.1 g/dL   Albumin 4.0 3.5 - 5.0 g/dL   AST 16 15 - 41 U/L   ALT 19 17 - 63 U/L   Alkaline Phosphatase 79 38 - 126 U/L   Total Bilirubin 0.3 0.3 - 1.2 mg/dL   GFR calc non Af Amer >60 >60 mL/min   GFR calc Af Amer >60 >60 mL/min   Anion gap 7 5 - 15  Troponin I  Result Value Ref Range   Troponin I <0.03 <0.031 ng/mL      Assessment & Plan:   Problem List Items Addressed This Visit    Trigger thumb of left hand - Primary    Concern with significant reduced flexion function of L thumb for >4 months, seems to have been trigger thumb initially then may have had complication or worsening injury with tendon sheath. May also have some underlying CMC joint arthritis contributing. No evidence of cyst on exam or other deformity. No palpable triggering. No other red flags, no snuff box pain, no trauma.  Plan: 1. Check L hand X-ray today - reviewed result,  negative for bony abnormality 2. Advised he will most likely need to be seen by Orthopedics - request to see [facility] today, will try to establish with Hand specialist for second opinion. May need more advanced imaging and [place] possibly, consider future steroid injection in thumb, however I am concerned he may need a surgical repair if tendon is involved 3. Continue conservative care in meantime with thumb spica splint, RICE, avoid over use and heavy lifting, may use topical aspercreme or other topical pain relief, take Tylenol PRN, avoid NSAIDs future may try voltaren topical 4. Referral to [facility] sent 5. Follow-up as needed can check on status at next visit 3 mo      Relevant Orders   DG Finger Thumb Left (Completed)   Ambulatory referral to Orthopedic Surgery   BP (high blood pressure)    Uncontrolled HTN, off of all medications for >6 months, non adherence. - Home BP readings none  No known complications - last labs 2016   Plan:  1. Refill all prior anti-HTN meds today - Amlodipine 10mg  daily, HCTZ 25mg  daily, Lisinopril 40mg  2. Except refilled lower dose Metoprolol 100mg  BID - concern with 150mg  BID 3. Encourage improved lifestyle - low sodium diet, regular exercise 4. Continue monitor BP outside office at pharmacy, bring readings to next visit, if persistently >140/90 or new symptoms notify office sooner 5. Follow-up 3 months BP - discuss labs and other follow-up      Relevant Medications   metoprolol tartrate (LOPRESSOR) 100 MG tablet   lisinopril (PRINIVIL,ZESTRIL) 40 MG tablet   hydrochlorothiazide (HYDRODIURIL) 25 MG tablet   amLODipine (NORVASC) 10 MG tablet    Other Visit Diagnoses    Chronic pain of left thumb       Likely secondary to mechanical abnormality with L trigger thumb and possibly tendon injury. Check X-ray   Relevant Orders   DG Finger Thumb Left (Completed)   Ambulatory referral to Orthopedic Surgery      Meds ordered this encounter  Medications  . metoprolol tartrate (LOPRESSOR) 100 MG tablet    Sig: Take 1 tablet (100 mg total) by mouth 2 (two) times daily.    Dispense:  180 tablet    Refill:  1  . lisinopril (PRINIVIL,ZESTRIL) 40 MG tablet    Sig: Take 1 tablet (40 mg total) by mouth daily.    Dispense:  90 tablet    Refill:  1  . hydrochlorothiazide (HYDRODIURIL) 25 MG tablet    Sig: Take 1 tablet (25 mg total) by mouth daily.    Dispense:  90 tablet    Refill:  1  . amLODipine (NORVASC) 10 MG tablet    Sig: Take 1 tablet (10 mg total) by mouth daily.    Dispense:  90 tablet    Refill:  1    Follow up plan: Return in about 3 months (around 07/16/2017) for blood pressure.  Saralyn PilarAlexander Karamalegos, DO [facility] 04/15/2017, 1:27 PM

## 2017-04-15 NOTE — Assessment & Plan Note (Addendum)
Concern with significant reduced flexion function of L thumb for >4 months, seems to have been trigger thumb initially then may have had complication or worsening injury with tendon sheath. May also have some underlying CMC joint arthritis contributing. No evidence of cyst on exam or other deformity. No palpable triggering. No other red flags, no snuff box pain, no trauma.  Plan: 1. Check L hand X-ray today - reviewed result, negative for bony abnormality 2. Advised he will most likely need to be seen by Orthopedics - request to see [facility] today, will try to establish with Hand specialist for second opinion. May need more advanced imaging and US possibly, consider future steroid injection in thumb, however I am concerned he may need a surgical repair if tendon is involved 3. Continue conservative care in meantime with thumb spica splint, RICE, avoid over use and heavy lifting, may use topical aspercreme or other topical pain relief, take Tylenol PRN, avoid NSAIDs future may try voltaren topical 4. Referral to [facility] sent 5. Follow-up as needed can check on status at next visit 3 mo

## 2017-06-18 DIAGNOSIS — J449 Chronic obstructive pulmonary disease, unspecified: Secondary | ICD-10-CM | POA: Diagnosis not present

## 2017-06-18 DIAGNOSIS — I720 Aneurysm of carotid artery: Secondary | ICD-10-CM | POA: Diagnosis not present

## 2017-06-28 ENCOUNTER — Encounter: Payer: Self-pay | Admitting: Emergency Medicine

## 2017-06-28 DIAGNOSIS — F419 Anxiety disorder, unspecified: Secondary | ICD-10-CM | POA: Diagnosis not present

## 2017-06-28 DIAGNOSIS — Z79899 Other long term (current) drug therapy: Secondary | ICD-10-CM | POA: Insufficient documentation

## 2017-06-28 DIAGNOSIS — F17210 Nicotine dependence, cigarettes, uncomplicated: Secondary | ICD-10-CM | POA: Insufficient documentation

## 2017-06-28 DIAGNOSIS — F329 Major depressive disorder, single episode, unspecified: Secondary | ICD-10-CM | POA: Insufficient documentation

## 2017-06-28 DIAGNOSIS — I10 Essential (primary) hypertension: Secondary | ICD-10-CM | POA: Diagnosis not present

## 2017-06-28 DIAGNOSIS — J449 Chronic obstructive pulmonary disease, unspecified: Secondary | ICD-10-CM | POA: Insufficient documentation

## 2017-06-28 DIAGNOSIS — R1013 Epigastric pain: Secondary | ICD-10-CM | POA: Diagnosis not present

## 2017-06-28 DIAGNOSIS — R1084 Generalized abdominal pain: Secondary | ICD-10-CM | POA: Diagnosis not present

## 2017-06-28 DIAGNOSIS — K8020 Calculus of gallbladder without cholecystitis without obstruction: Secondary | ICD-10-CM | POA: Diagnosis not present

## 2017-06-28 DIAGNOSIS — K819 Cholecystitis, unspecified: Secondary | ICD-10-CM | POA: Diagnosis not present

## 2017-06-28 DIAGNOSIS — K810 Acute cholecystitis: Secondary | ICD-10-CM | POA: Diagnosis not present

## 2017-06-28 LAB — CBC
HEMATOCRIT: 45.4 % (ref 40.0–52.0)
HEMOGLOBIN: 16 g/dL (ref 13.0–18.0)
MCH: 31.9 pg (ref 26.0–34.0)
MCHC: 35.2 g/dL (ref 32.0–36.0)
MCV: 90.7 fL (ref 80.0–100.0)
Platelets: 168 10*3/uL (ref 150–440)
RBC: 5 MIL/uL (ref 4.40–5.90)
RDW: 13.8 % (ref 11.5–14.5)
WBC: 9.8 10*3/uL (ref 3.8–10.6)

## 2017-06-28 LAB — URINALYSIS, COMPLETE (UACMP) WITH MICROSCOPIC
BACTERIA UA: NONE SEEN
Bilirubin Urine: NEGATIVE
GLUCOSE, UA: NEGATIVE mg/dL
KETONES UR: NEGATIVE mg/dL
Leukocytes, UA: NEGATIVE
Nitrite: NEGATIVE
PROTEIN: 100 mg/dL — AB
Specific Gravity, Urine: 1.02 (ref 1.005–1.030)
Squamous Epithelial / LPF: NONE SEEN
pH: 6 (ref 5.0–8.0)

## 2017-06-28 LAB — COMPREHENSIVE METABOLIC PANEL
ALBUMIN: 4.3 g/dL (ref 3.5–5.0)
ALT: 27 U/L (ref 17–63)
ANION GAP: 7 (ref 5–15)
AST: 22 U/L (ref 15–41)
Alkaline Phosphatase: 75 U/L (ref 38–126)
BUN: 17 mg/dL (ref 6–20)
CALCIUM: 9.3 mg/dL (ref 8.9–10.3)
CO2: 30 mmol/L (ref 22–32)
Chloride: 106 mmol/L (ref 101–111)
Creatinine, Ser: 1.12 mg/dL (ref 0.61–1.24)
GFR calc non Af Amer: >60 mL/min (ref >60)
GLUCOSE: 148 mg/dL — AB (ref 65–99)
POTASSIUM: 3.6 mmol/L (ref 3.5–5.1)
SODIUM: 143 mmol/L (ref 135–145)
Total Bilirubin: 0.7 mg/dL (ref 0.3–1.2)
Total Protein: 7.3 g/dL (ref 6.5–8.1)

## 2017-06-28 LAB — LIPASE, BLOOD: Lipase: 19 U/L (ref 11–51)

## 2017-06-28 LAB — TROPONIN I

## 2017-06-28 NOTE — ED Triage Notes (Signed)
Pt reports that he has been having abdominal pain for the last two years and had an episode tonight in which he became diaphoretic. Pt is diaphoretic at this time in triage. Pt is ambulatory at this time.

## 2017-06-28 NOTE — ED Triage Notes (Signed)
Patient brought ib by ems from home. Patient with complaint of epigastric that started this evening. Per ems patient is hypertensive but has not been taking his bp medications for months.

## 2017-06-29 ENCOUNTER — Observation Stay
Admission: EM | Admit: 2017-06-29 | Discharge: 2017-06-29 | Disposition: A | Discharge status: Home / Self Care | Payer: Medicare Other | Attending: Surgery | Admitting: Surgery

## 2017-06-29 ENCOUNTER — Emergency Department: Payer: Medicare Other

## 2017-06-29 DIAGNOSIS — K8020 Calculus of gallbladder without cholecystitis without obstruction: Secondary | ICD-10-CM | POA: Diagnosis not present

## 2017-06-29 DIAGNOSIS — R1013 Epigastric pain: Secondary | ICD-10-CM

## 2017-06-29 DIAGNOSIS — Z8719 Personal history of other diseases of the digestive system: Secondary | ICD-10-CM | POA: Diagnosis present

## 2017-06-29 DIAGNOSIS — K819 Cholecystitis, unspecified: Secondary | ICD-10-CM

## 2017-06-29 DIAGNOSIS — K810 Acute cholecystitis: Principal | ICD-10-CM

## 2017-06-29 DIAGNOSIS — I10 Essential (primary) hypertension: Secondary | ICD-10-CM

## 2017-06-29 DIAGNOSIS — Z9049 Acquired absence of other specified parts of digestive tract: Secondary | ICD-10-CM | POA: Diagnosis present

## 2017-06-29 HISTORY — DX: Cholecystitis, unspecified: K81.9

## 2017-06-29 HISTORY — DX: Acute cholecystitis: K81.0

## 2017-06-29 LAB — COMPREHENSIVE METABOLIC PANEL
ALT: 23 U/L (ref 17–63)
AST: 20 U/L (ref 15–41)
Albumin: 3.9 g/dL (ref 3.5–5.0)
Alkaline Phosphatase: 67 U/L (ref 38–126)
Anion gap: 8 (ref 5–15)
BILIRUBIN TOTAL: 0.9 mg/dL (ref 0.3–1.2)
BUN: 14 mg/dL (ref 6–20)
CHLORIDE: 109 mmol/L (ref 101–111)
CO2: 25 mmol/L (ref 22–32)
CREATININE: 0.98 mg/dL (ref 0.61–1.24)
Calcium: 8.6 mg/dL — ABNORMAL LOW (ref 8.9–10.3)
GFR calc Af Amer: >60 mL/min (ref >60)
Glucose, Bld: 113 mg/dL — ABNORMAL HIGH (ref 65–99)
POTASSIUM: 3.8 mmol/L (ref 3.5–5.1)
Sodium: 142 mmol/L (ref 135–145)
TOTAL PROTEIN: 6.4 g/dL — AB (ref 6.5–8.1)

## 2017-06-29 LAB — CBC
HCT: 42 % (ref 40.0–52.0)
Hemoglobin: 14.8 g/dL (ref 13.0–18.0)
MCH: 31.8 pg (ref 26.0–34.0)
MCHC: 35.3 g/dL (ref 32.0–36.0)
MCV: 89.8 fL (ref 80.0–100.0)
PLATELETS: 147 10*3/uL — AB (ref 150–440)
RBC: 4.67 MIL/uL (ref 4.40–5.90)
RDW: 13.9 % (ref 11.5–14.5)
WBC: 9.6 10*3/uL (ref 3.8–10.6)

## 2017-06-29 LAB — TROPONIN I: Troponin I: <0.03 ng/mL (ref <0.03)

## 2017-06-29 MED ORDER — FAMOTIDINE IN NACL 20-0.9 MG/50ML-% IV SOLN
INTRAVENOUS | Status: AC
  Filled 2017-06-29: qty 50

## 2017-06-29 MED ORDER — ENOXAPARIN SODIUM 40 MG/0.4ML ~~LOC~~ SOLN
40.0000 mg | SUBCUTANEOUS | Status: DC
  Administered 2017-06-29: 40 mg via SUBCUTANEOUS
  Filled 2017-06-29: qty 0.4

## 2017-06-29 MED ORDER — LACTATED RINGERS IV SOLN
125.0000 mL/h | INTRAVENOUS | Status: DC
  Administered 2017-06-29: 125 mL/h via INTRAVENOUS

## 2017-06-29 MED ORDER — POLYETHYLENE GLYCOL 3350 17 G PO PACK: 17.0000 g | PACK | Freq: Every day | ORAL | Status: DC | PRN

## 2017-06-29 MED ORDER — HYDRALAZINE HCL 20 MG/ML IJ SOLN: 10.0000 mg | Freq: Four times a day (QID) | INTRAMUSCULAR | Status: DC | PRN

## 2017-06-29 MED ORDER — MORPHINE SULFATE (PF) 4 MG/ML IV SOLN
4.0000 mg | Freq: Once | INTRAVENOUS | Status: AC
  Administered 2017-06-29: 4 mg via INTRAVENOUS

## 2017-06-29 MED ORDER — PIPERACILLIN-TAZOBACTAM 3.375 G IVPB
3.3750 g | Freq: Three times a day (TID) | INTRAVENOUS | Status: DC
  Administered 2017-06-29: 3.375 g via INTRAVENOUS

## 2017-06-29 MED ORDER — ONDANSETRON HCL 4 MG/2ML IJ SOLN
4.0000 mg | Freq: Once | INTRAMUSCULAR | Status: AC
  Administered 2017-06-29: 4 mg via INTRAVENOUS

## 2017-06-29 MED ORDER — NICOTINE 14 MG/24HR TD PT24
14.0000 mg | MEDICATED_PATCH | Freq: Every day | TRANSDERMAL | Status: DC
  Administered 2017-06-29: 14 mg via TRANSDERMAL
  Filled 2017-06-29: qty 1

## 2017-06-29 MED ORDER — OXYCODONE-ACETAMINOPHEN 5-325 MG PO TABS: 1.0000 | ORAL_TABLET | ORAL | 0 refills | Status: DC | PRN

## 2017-06-29 MED ORDER — HYDRALAZINE HCL 20 MG/ML IJ SOLN
10.0000 mg | Freq: Four times a day (QID) | INTRAMUSCULAR | Status: DC | PRN
  Administered 2017-06-29: 10 mg via INTRAVENOUS

## 2017-06-29 MED ORDER — FAMOTIDINE IN NACL 20-0.9 MG/50ML-% IV SOLN
20.0000 mg | Freq: Once | INTRAVENOUS | Status: AC
  Administered 2017-06-29: 20 mg via INTRAVENOUS

## 2017-06-29 MED ORDER — SODIUM CHLORIDE 0.9 % IV BOLUS (SEPSIS)
1000.0000 mL | Freq: Once | INTRAVENOUS | Status: AC
  Administered 2017-06-29: 1000 mL via INTRAVENOUS

## 2017-06-29 MED ORDER — PANTOPRAZOLE SODIUM 40 MG IV SOLR: 40.0000 mg | Freq: Every day | INTRAVENOUS | Status: DC

## 2017-06-29 MED ORDER — ONDANSETRON HCL 4 MG/2ML IJ SOLN
INTRAMUSCULAR | Status: AC
  Filled 2017-06-29: qty 2

## 2017-06-29 MED ORDER — PIPERACILLIN-TAZOBACTAM 3.375 G IVPB 30 MIN
INTRAVENOUS | Status: AC
  Filled 2017-06-29: qty 50

## 2017-06-29 MED ORDER — AMOXICILLIN-POT CLAVULANATE 875-125 MG PO TABS: 1.0000 | ORAL_TABLET | Freq: Two times a day (BID) | ORAL | 1 refills | Status: DC

## 2017-06-29 MED ORDER — ONDANSETRON 4 MG PO TBDP: 4.0000 mg | ORAL_TABLET | Freq: Four times a day (QID) | ORAL | Status: DC | PRN

## 2017-06-29 MED ORDER — ONDANSETRON HCL 4 MG/2ML IJ SOLN: 4.0000 mg | Freq: Four times a day (QID) | INTRAMUSCULAR | Status: DC | PRN

## 2017-06-29 MED ORDER — MORPHINE SULFATE (PF) 4 MG/ML IV SOLN
INTRAVENOUS | Status: AC
  Filled 2017-06-29: qty 1

## 2017-06-29 MED ORDER — KETOROLAC TROMETHAMINE 30 MG/ML IJ SOLN
30.0000 mg | Freq: Four times a day (QID) | INTRAMUSCULAR | Status: DC
  Administered 2017-06-29: 30 mg via INTRAVENOUS
  Filled 2017-06-29: qty 1

## 2017-06-29 MED ORDER — HYDROMORPHONE HCL 1 MG/ML IJ SOLN: 0.5000 mg | INTRAMUSCULAR | Status: DC | PRN

## 2017-06-29 MED ORDER — HYDRALAZINE HCL 20 MG/ML IJ SOLN
INTRAMUSCULAR | Status: AC
  Filled 2017-06-29: qty 1

## 2017-06-29 NOTE — Care Management Obs Status (Signed)
MEDICARE OBSERVATION STATUS NOTIFICATION   Patient Details  Name: Eric Lawrence MRN: [id number] Date of Birth: 08/26/1966   Medicare Observation Status Notification Given:  Yes    Marily Memos, RN 06/29/2017, 11:08 AM

## 2017-06-29 NOTE — Discharge Instructions (Signed)
Resume low-fat diet  Avoid fatty meals  Take oral antibiotics and oral analgesics as prescribed.  Follow-up with [facility] in 1 week. Return to the emergency room should pain returned and not be controlled by oral analgesics.

## 2017-06-29 NOTE — H&P (Signed)
Date of Admission:  06/29/2017  Reason for Admission:  Acute cholecystitis  History of Present Illness: Eric Lawrence is a 51 y.o. male who presents with a one-day history of abdominal pain, associated with nausea and dry heaving.  He reports that his pain started in the afternoon on 8/26 after having lunch.  He has had nausea with this pain, which had not improved, and has had dry heaving but no emesis.  His pain radiates to the back between the shoulder blades.  He has not tried anything in particular for the pain.  He also started having sweats tonight, which is what prompted him to come to the ED.  He has had episodes of intermittent epigastric abdominal pain over the past two years since his first episode.  However, those episodes were short-lasted.  His pain currently has improved but he just recently got pain medication.  He has had an EGD about three years ago and was diagnosed with ulcer / gastritis.  Of note, he also does not take his medications.  He has COPD and uses CPAP at night.  Past Medical History: Past Medical History:  Diagnosis Date  . Anxiety   . COPD (chronic obstructive pulmonary disease) (HCC)   . Depression   . Hypertension      Past Surgical History: Past Surgical History:  Procedure Laterality Date  . cyst removal from wrist      Home Medications: Prior to Admission medications   Not on File    Allergies: No Known Allergies  Social History:  reports that he has been smoking Cigarettes.  He has been smoking about 0.25 packs per day. He has never used smokeless tobacco. He reports that he does not drink alcohol or use drugs.   Family History: Family History  Problem Relation Age of Onset  . Stroke Mother 7  . Diabetes Mother   . Kidney disease Mother   . Hypertension Mother   . COPD Maternal Aunt   . Diabetes Maternal Grandmother     Review of Systems: Review of Systems  Constitutional: Positive for diaphoresis. Negative for chills and  fever.  HENT: Negative for hearing loss.   Respiratory: Negative for shortness of breath.   Cardiovascular: Negative for chest pain.  Gastrointestinal: Positive for abdominal pain and nausea. Negative for constipation, diarrhea and vomiting.  Genitourinary: Negative for dysuria.  Musculoskeletal: Positive for back pain.  Neurological: Negative for dizziness.  Psychiatric/Behavioral: Negative for depression.  All other systems reviewed and are negative.   Physical Exam BP (!) 182/93   Pulse 62   Temp 99.1 F (37.3 C) (Oral)   Resp 19   Ht 6\' 2"  (1.88 m)   Wt 127 kg (280 lb)   SpO2 96%   BMI 35.95 kg/m  CONSTITUTIONAL: No acute distress HEENT:  Normocephalic, atraumatic, extraocular motion intact. NECK: Trachea is midline, and there is no jugular venous distension.  RESPIRATORY:  Lungs are clear, and breath sounds are equal bilaterally. Normal respiratory effort without pathologic use of accessory muscles. CARDIOVASCULAR: Heart is regular without murmurs, gallops, or rubs. GI: The abdomen is soft, nondistended, tender to palpation in the epigastric and right upper quadrant regions.  Unreliable Murphy's sign as he has received pain medication. There were no palpable masses.  MUSCULOSKELETAL:  Normal muscle strength and tone in all four extremities.  No peripheral edema or cyanosis. SKIN: Skin turgor is normal. There are no pathologic skin lesions.  NEUROLOGIC:  Motor and sensation is grossly normal.  Cranial  nerves are grossly intact. PSYCH:  Alert and oriented to person, place and time. Affect is normal.  Laboratory Analysis: Results for orders placed or performed during the hospital encounter of 06/29/17 (from the past 24 hour(s))  Lipase, blood     Status: None   Collection Time: 06/28/17 11:01 PM  Result Value Ref Range   Lipase 19 11 - 51 U/L  Comprehensive metabolic panel     Status: Abnormal   Collection Time: 06/28/17 11:01 PM  Result Value Ref Range   Sodium 143 135 -  145 mmol/L   Potassium 3.6 3.5 - 5.1 mmol/L   Chloride 106 101 - 111 mmol/L   CO2 30 22 - 32 mmol/L   Glucose, Bld 148 (H) 65 - 99 mg/dL   BUN 17 6 - 20 mg/dL   Creatinine, Ser 1.61 0.61 - 1.24 mg/dL   Calcium 9.3 8.9 - 09.6 mg/dL   Total Protein 7.3 6.5 - 8.1 g/dL   Albumin 4.3 3.5 - 5.0 g/dL   AST 22 15 - 41 U/L   ALT 27 17 - 63 U/L   Alkaline Phosphatase 75 38 - 126 U/L   Total Bilirubin 0.7 0.3 - 1.2 mg/dL   GFR calc non Af Amer >60 >60 mL/min   GFR calc Af Amer >60 >60 mL/min   Anion gap 7 5 - 15  CBC     Status: None   Collection Time: 06/28/17 11:01 PM  Result Value Ref Range   WBC 9.8 3.8 - 10.6 K/uL   RBC 5.00 4.40 - 5.90 MIL/uL   Hemoglobin 16.0 13.0 - 18.0 g/dL   HCT 04.5 40.9 - 81.1 %   MCV 90.7 80.0 - 100.0 fL   MCH 31.9 26.0 - 34.0 pg   MCHC 35.2 32.0 - 36.0 g/dL   RDW 91.4 78.2 - 95.6 %   Platelets 168 150 - 440 K/uL  Urinalysis, Complete w Microscopic     Status: Abnormal   Collection Time: 06/28/17 11:01 PM  Result Value Ref Range   Color, Urine YELLOW (A) YELLOW   APPearance CLEAR (A) CLEAR   Specific Gravity, Urine 1.020 1.005 - 1.030   pH 6.0 5.0 - 8.0   Glucose, UA NEGATIVE NEGATIVE mg/dL   Hgb urine dipstick SMALL (A) NEGATIVE   Bilirubin Urine NEGATIVE NEGATIVE   Ketones, ur NEGATIVE NEGATIVE mg/dL   Protein, ur 213 (A) NEGATIVE mg/dL   Nitrite NEGATIVE NEGATIVE   Leukocytes, UA NEGATIVE NEGATIVE   RBC / HPF 0-5 0 - 5 RBC/hpf   WBC, UA 0-5 0 - 5 WBC/hpf   Bacteria, UA NONE SEEN NONE SEEN   Squamous Epithelial / LPF NONE SEEN NONE SEEN   Mucus PRESENT   Troponin I     Status: None   Collection Time: 06/28/17 11:01 PM  Result Value Ref Range   Troponin I <0.03 <0.03 ng/mL  Troponin I     Status: None   Collection Time: 06/29/17  2:43 AM  Result Value Ref Range   Troponin I <0.03 <0.03 ng/mL    Imaging: US Abdomen Limited Ruq  Result Date: 06/29/2017 CLINICAL DATA:  Epigastric pain for 7 hours EXAM: ULTRASOUND ABDOMEN LIMITED RIGHT  UPPER QUADRANT COMPARISON:  None. FINDINGS: Gallbladder: Large calcified stone in the gallbladder measuring up to 3.2 cm with associated sludge. Stone appears non mobile at the gallbladder neck. Wall thickening up to 6.7 mm with possible small amount of pericholecystic fluid. Negative sonographic Murphy. Common bile duct: Diameter: Upper  normal at 5.9 mm. Liver: No focal lesion identified. Within normal limits in parenchymal echogenicity. Portal vein is patent on color Doppler imaging with normal direction of blood flow towards the liver. Incidental note made of a cyst in the upper pole of the right kidney measuring 2.5 x 1.7 x 1.5 cm. IMPRESSION: 1. Large non mobile stone at the gallbladder neck with associated sludge. Wall thickening and possible trace amount of pericholecystic fluid, findings raise suspicion for acute cholecystitis despite negative sonographic Murphy. No biliary dilatation 2. Incidental note made of a cyst in the upper pole of the right kidney Electronically Signed   By: Jasmine Pang M.D.   On: 06/29/2017 02:09    Assessment and Plan: This is a 51 y.o. male who presents with acute cholecystitis.  I have independently viewed the patient's imaging study and reviewed his labs.  His U/S does show an inflamed gallbladder with wall thickening and surrounding pericholecystic fluid.  His WBC is normal but he is tender to palpation and has other signs of cholecystitis.   The patient currently had planned to move this week and would be needing to do heavy lifting and pushing.  Discussed with the patient that we can plan on admitting the patient and start him on IV antibiotics, make him NPO with IV fluid hydration and appropriate pain and nausea medication.  If there is no improvement, he would need surgical intervention in the way of cholecystectomy.  Discussed with the patient that one of my partners, Dr. Excell Seltzer, would be the surgeon on days this week and he can discuss with him in the morning  further about his symptoms and progress and can make further decisions and plans based on that.  He has been hypertensive in the ED but has not been taking his medications at home.  Will start prn hydralazine here.  Will also continue his CPAP here.  Patient understands this plan and all of his questions have been answered.   Howie Ill, MD [facility]

## 2017-06-29 NOTE — Care Management CC44 (Signed)
Condition Code 44 Documentation Completed  Patient Details  Name: LYDIA KRUMREY MRN: [id number] Date of Birth: [birth year]   Condition Code 44 given:  Yes Patient signature on Condition Code 44 notice:  Yes Documentation of 2 MD's agreement:  Yes Code 44 added to claim:  Yes    Marily Memos, RN 06/29/2017, 11:08 AM

## 2017-06-29 NOTE — ED Provider Notes (Signed)
[facility] Emergency Department Provider Note   ____________________________________________   First MD Initiated Contact with Patient 06/29/17 [phone]     (approximate)  I have reviewed the triage vital signs and the nursing notes.   HISTORY  Chief Complaint Abdominal Pain    HPI Eric Lawrence is a 51 y.o. male who presents to the ED from home via EMSwith a chief complaint abdominal pain. Patient were towards abdominal pain for the past 2 years after being diagnosed with peptic ulcer. Ate a heavy brunch yesterday afternoon and had an episode in the evening with increased epigastric pain associated with diaphoresis.Reports several episodes of nausea/dry heaving. Denies associated fever, chills, chest pain, shortness of breath, dysuria, diarrhea. Denies recent travel or trauma. Did not take anything for the pain. States he has not taken any of his medications for the past several months.   Past Medical History:  Diagnosis Date  . Anxiety   . COPD (chronic obstructive pulmonary disease) (HCC)   . Depression   . Hypertension     Patient Active Problem List   Diagnosis Date Noted  . Acute cholecystitis 06/29/2017  . Trigger thumb of left hand 04/15/2017  . Obesity 09/10/2015  . Anxiety disorder 04/19/2015  . Arthritis 04/19/2015  . Airway hyperreactivity 04/19/2015  . CAFL (chronic airflow limitation) (HCC) 04/19/2015  . 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 Encounter for general adult medical examination without abnormal findings 04/19/2015  . H/O elevated lipids 04/19/2015  . BP (high blood pressure) 04/19/2015  . Encounter for immunization 04/19/2015  . Pneumococcal vaccination given 04/19/2015  . Mechanical and motor problems with internal organs 04/19/2015  . At risk for injury 04/19/2015  . Compulsive tobacco user syndrome 04/19/2015  . Chronic gastritis 04/19/2015    Past Surgical History:  Procedure Laterality Date  . cyst removal  from wrist      Prior to Admission medications   Not on File    Allergies Patient has no known allergies.  Family History  Problem Relation Age of Onset  . Stroke Mother 69  . Diabetes Mother   . Kidney disease Mother   . Hypertension Mother   . COPD Maternal Aunt   . Diabetes Maternal Grandmother     Social History Social History  Substance Use Topics  . Smoking status: Current Every Day Smoker    Packs/day: 0.25    Types: Cigarettes  . Smokeless tobacco: Never Used  . Alcohol use No    Review of Systems  Constitutional: No fever/chills. Eyes: No visual changes. ENT: No sore throat. Cardiovascular: Denies chest pain. Respiratory: Denies shortness of breath. Gastrointestinal: Positive for abdominal pain and nausea, no vomiting.  No diarrhea.  No constipation. Genitourinary: Negative for dysuria. Musculoskeletal: Negative for back pain. Skin: Negative for rash. Neurological: Negative for headaches, focal weakness or numbness.   ____________________________________________   PHYSICAL EXAM:  VITAL SIGNS: ED Triage Vitals  Enc Vitals Group     BP 06/28/17 2257 (!) 229/127     Pulse Rate 06/28/17 2257 62     Resp 06/28/17 2257 18     Temp 06/28/17 2257 99.1 F (37.3 C)     Temp Source 06/28/17 2257 Oral     SpO2 06/28/17 2257 99 %     Weight 06/28/17 2259 280 lb (127 kg)     Height 06/28/17 2259 6\' 2"  (1.88 m)     Head Circumference --      Peak Flow --  Pain Score 06/28/17 2255 8     Pain Loc --      Pain Edu? --      Excl. in GC? --     Constitutional: Alert and oriented. Well appearing and in no acute distress. Eyes: Conjunctivae are normal. PERRL. EOMI. Head: Atraumatic. Nose: No congestion/rhinnorhea. Mouth/Throat: Mucous membranes are moist.  Oropharynx non-erythematous. Neck: No stridor.   Cardiovascular: Normal rate, regular rhythm. Grossly normal heart sounds.  Good peripheral circulation. Respiratory: Normal respiratory effort.  No  retractions. Lungs CTAB. Gastrointestinal: Soft and mildly tender to palpation epigastrium without rebound or guarding. No distention. No abdominal bruits. No CVA tenderness. Musculoskeletal: No lower extremity tenderness nor edema.  No joint effusions. Neurologic:  Normal speech and language. No gross focal neurologic deficits are appreciated. No gait instability. Skin:  Skin is warm, dry and intact. No rash noted. Psychiatric: Mood and affect are normal. Speech and behavior are normal.  ____________________________________________   LABS (all labs ordered are listed, but only abnormal results are displayed)  Labs Reviewed  COMPREHENSIVE METABOLIC PANEL - Abnormal; Notable for the following:       Result Value   Glucose, Bld 148 (*)    All other components within normal limits  URINALYSIS, COMPLETE (UACMP) WITH MICROSCOPIC - Abnormal; Notable for the following:    Color, Urine YELLOW (*)    APPearance CLEAR (*)    Hgb urine dipstick SMALL (*)    Protein, ur 100 (*)    All other components within normal limits  LIPASE, BLOOD  CBC  TROPONIN I  TROPONIN I  CBC  COMPREHENSIVE METABOLIC PANEL   ____________________________________________  EKG  ED ECG REPORT I, Angelette Ganus J, the attending physician, personally viewed and interpreted this ECG.   Date: 06/29/2017  EKG Time: 2304  Rate: 60  Rhythm: normal EKG, normal sinus rhythm  Axis: normal  Intervals:none  ST&T Change: nonspecific  ____________________________________________  RADIOLOGY  US Abdomen Limited Ruq  Result Date: 06/29/2017 CLINICAL DATA:  Epigastric pain for 7 hours EXAM: ULTRASOUND ABDOMEN LIMITED RIGHT UPPER QUADRANT COMPARISON:  None. FINDINGS: Gallbladder: Large calcified stone in the gallbladder measuring up to 3.2 cm with associated sludge. Stone appears non mobile at the gallbladder neck. Wall thickening up to 6.7 mm with possible small amount of pericholecystic fluid. Negative sonographic Murphy.  Common bile duct: Diameter: Upper normal at 5.9 mm. Liver: No focal lesion identified. Within normal limits in parenchymal echogenicity. Portal vein is patent on color Doppler imaging with normal direction of blood flow towards the liver. Incidental note made of a cyst in the upper pole of the right kidney measuring 2.5 x 1.7 x 1.5 cm. IMPRESSION: 1. Large non mobile stone at the gallbladder neck with associated sludge. Wall thickening and possible trace amount of pericholecystic fluid, findings raise suspicion for acute cholecystitis despite negative sonographic Murphy. No biliary dilatation 2. Incidental note made of a cyst in the upper pole of the right kidney Electronically Signed   By: Jasmine Pang M.D.   On: 06/29/2017 02:09    ____________________________________________   PROCEDURES  Procedure(s) performed: None  Procedures  Critical Care performed: No  ____________________________________________   INITIAL IMPRESSION / ASSESSMENT AND PLAN / ED COURSE  Pertinent labs & imaging results that were available during my care of the patient were reviewed by me and considered in my medical decision making (see chart for details).  51 year old male with hypertension, peptic ulcer disease who is noncompliant with medications presenting with epigastric pain for 2  years, increased last evening. Initial laboratory and urinalysis results unremarkable. EKG within normal limits. Will obtain abdominal ultrasound, initiate IV fluid resuscitation, Pepcid, analgesia/antiemetic and reassess. Noted elevated blood pressure. Will give patient his home medications once he returns from ultrasound.  Clinical Course as of Jun 30 511  Mon Jun 29, 2017  1610 Updated patient information members of ultrasound results. Discussed with surgery on-call Dr. Aleen Campi who will evaluate patient in the emergency department.  [JS]    Clinical Course User Index [JS] Irean Hong, MD      ____________________________________________   FINAL CLINICAL IMPRESSION(S) / ED DIAGNOSES  Final diagnoses:  Epigastric pain  Essential hypertension  Cholecystitis      NEW MEDICATIONS STARTED DURING THIS VISIT:  There are no discharge medications for this patient.    Note:  This document was prepared using Dragon voice recognition software and may include unintentional dictation errors.    Irean Hong, MD 06/29/17 [phone]

## 2017-06-29 NOTE — Progress Notes (Signed)
Pt provided with discharge instructions and prescription . Pt able to verbalize appropriate meal choices,and when follow up appointment is. All belongings with patient. Pt transported to ride via volunteer services. Pt discharged in stable condition.

## 2017-06-29 NOTE — ED Notes (Signed)
Pt transport to 219

## 2017-06-29 NOTE — Progress Notes (Signed)
CC gallstones Subjective: Patient feels much better today stating that his pain is completely gone he has no nausea vomiting fevers or chills no jaundice no icterus Objective: Vital signs in last 24 hours: Temp:  [98.2 F (36.8 C)-99.1 F (37.3 C)] 98.2 F (36.8 C) (08/27 0900) Pulse Rate:  [62-71] 68 (08/27 0900) Resp:  [16-19] 16 (08/27 0900) BP: (148-229)/(86-127) 148/86 (08/27 0900) SpO2:  [96 %-99 %] 96 % (08/27 0900) Weight:  [256 lb 11.2 oz (116.4 kg)-280 lb (127 kg)] 256 lb 11.2 oz (116.4 kg) (08/27 0415) Last BM Date: 06/28/17  Intake/Output from previous day: 08/26 0701 - 08/27 0700 In: 0  Out: 300 [Urine:300] Intake/Output this shift: Total I/O In: 650 [I.V.:650] Out: -   Physical exam:  Vital signs reviewed and stable. Abdomen is soft nontender no Murphy sign no tenderness in the right upper quadrant. No icterus no jaundice.  Lab Results: CBC   Recent Labs  06/28/17 2301 06/29/17 0457  WBC 9.8 9.6  HGB 16.0 14.8  HCT 45.4 42.0  PLT 168 147*   BMET  Recent Labs  06/28/17 2301 06/29/17 0457  NA 143 142  K 3.6 3.8  CL 106 109  CO2 30 25  GLUCOSE 148* 113*  BUN 17 14  CREATININE 1.12 0.98  CALCIUM 9.3 8.6*   PT/INR No results for input(s): LABPROT, INR in the last 72 hours. ABG No results for input(s): PHART, HCO3 in the last 72 hours.  Invalid input(s): PCO2, PO2  Studies/Results: US Abdomen Limited Ruq  Result Date: 06/29/2017 CLINICAL DATA:  Epigastric pain for 7 hours EXAM: ULTRASOUND ABDOMEN LIMITED RIGHT UPPER QUADRANT COMPARISON:  None. FINDINGS: Gallbladder: Large calcified stone in the gallbladder measuring up to 3.2 cm with associated sludge. Stone appears non mobile at the gallbladder neck. Wall thickening up to 6.7 mm with possible small amount of pericholecystic fluid. Negative sonographic Murphy. Common bile duct: Diameter: Upper normal at 5.9 mm. Liver: No focal lesion identified. Within normal limits in parenchymal  echogenicity. Portal vein is patent on color Doppler imaging with normal direction of blood flow towards the liver. Incidental note made of a cyst in the upper pole of the right kidney measuring 2.5 x 1.7 x 1.5 cm. IMPRESSION: 1. Large non mobile stone at the gallbladder neck with associated sludge. Wall thickening and possible trace amount of pericholecystic fluid, findings raise suspicion for acute cholecystitis despite negative sonographic Murphy. No biliary dilatation 2. Incidental note made of a cyst in the upper pole of the right kidney Electronically Signed   By: Jasmine Pang M.D.   On: 06/29/2017 02:09    Anti-infectives: Anti-infectives    Start     Dose/Rate Route Frequency Ordered Stop   06/29/17 0345  piperacillin-tazobactam (ZOSYN) IVPB 3.375 g     3.375 g 12.5 mL/hr over 240 Minutes Intravenous Every 8 hours 06/29/17 8657        Assessment/Plan:  White blood cell count remains normal.  Patient desires to be discharged as his pain is gone and he has plans to move this weekend. He would like to avoid surgery at this point and is willing to follow-up in our office next week for consideration of elective surgery. Dr. to return to the emergency room should his pain returned but would I will give him oral antibiotics and oral analgesics to get him through till next week. Currently he does not require any pain medication but I will give him a short course of when necessary analgesics as  well.  Lattie Haw, MD, FACS  06/29/2017

## 2017-07-08 ENCOUNTER — Ambulatory Visit (INDEPENDENT_AMBULATORY_CARE_PROVIDER_SITE_OTHER): Payer: Medicare Other | Admitting: Surgery

## 2017-07-08 ENCOUNTER — Encounter: Payer: Self-pay | Admitting: Surgery

## 2017-07-08 VITALS — BP 168/104 | HR 89 | Temp 98.1°F | Ht 72.0 in | Wt 251.4 lb

## 2017-07-08 DIAGNOSIS — K8020 Calculus of gallbladder without cholecystitis without obstruction: Secondary | ICD-10-CM

## 2017-07-08 NOTE — Progress Notes (Signed)
Outpatient Surgical Follow Up  07/08/2017  Eric HindRodney V Lawrence is an 51 y.o. male.   CC: Gallstones  HPI: This patient was seen in the hospital with what was believed to be acute cholecystitis but his pain resolved promptly without any therapy. He was moving his household and could not schedule surgery at that time and was discharged. He is here for follow-up for right upper quadrant pain associated with fatty food intolerance.  Patient has had one attack since being discharged from the hospital and states that that only lasted 2 seconds. Related to fatty foods however. He denies jaundice or acholic stools no fevers or chills and no nausea or vomiting.  He has recently moved to an area that is quite remote and which she has no lab line and no cell service. With that in mind if elective surgery as planned he should stay overnight for safety reasons.  Past Medical History:  Diagnosis Date  . Acute cholecystitis 06/29/2017  . 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 Airway hyperreactivity 04/19/2015  . Anxiety   . Anxiety disorder 04/19/2015  . Arthritis 04/19/2015  . At risk for injury 04/19/2015  . BP (high blood pressure) 04/19/2015  . CAFL (chronic airflow limitation) (HCC) 04/19/2015  . Cholecystitis 06/29/2017  . Chronic gastritis 04/19/2015  . Compulsive tobacco user syndrome 04/19/2015  . COPD (chronic obstructive pulmonary disease) (HCC)   . Depression   . Encounter for immunization 04/19/2015  . H/O elevated lipids 04/19/2015  . Hypertension   . Mechanical and motor problems with internal organs 04/19/2015  . Obesity 09/10/2015  . Pneumococcal vaccination given 04/19/2015  . Trigger thumb of left hand 04/15/2017    Past Surgical History:  Procedure Laterality Date  . cyst removal from wrist      Family History  Problem Relation Age of Onset  . Stroke Mother 765  . Diabetes Mother   . Kidney disease Mother   . Hypertension Mother   . COPD Maternal Aunt   . Diabetes Maternal  Grandmother    No family history of gallstones Social History:  reports that he has been smoking Cigarettes.  He has been smoking about 0.25 packs per day. He has never used smokeless tobacco. He reports that he does not drink alcohol or use drugs.  Allergies: No Known Allergies  Medications reviewed.   Review of Systems:   Review of Systems  Constitutional: Negative for chills and fever.  HENT: Negative.   Eyes: Negative.   Respiratory: Negative.   Cardiovascular: Negative.   Gastrointestinal: Positive for abdominal pain. Negative for blood in stool, constipation, diarrhea, heartburn, nausea and vomiting.  Genitourinary: Negative.   Musculoskeletal: Negative.   Skin: Negative.   Neurological: Negative.   Endo/Heme/Allergies: Negative.   Psychiatric/Behavioral: Negative.      Physical Exam:  There were no vitals taken for this visit.  Physical Exam  Constitutional: He is oriented to person, place, and time and well-developed, well-nourished, and in no distress. No distress.  HENT:  Head: Normocephalic and atraumatic.  Poor dentition  Eyes: Pupils are equal, round, and reactive to light. Right eye exhibits no discharge. Left eye exhibits no discharge. No scleral icterus.  Neck: Normal range of motion. No JVD present.  Cardiovascular: Normal rate, regular rhythm and normal heart sounds.   Pulmonary/Chest: Effort normal and breath sounds normal. No respiratory distress. He has no wheezes. He has no rales.  Abdominal: Soft. He exhibits no distension. There is no tenderness. There is no rebound and no  guarding.  Musculoskeletal: Normal range of motion. He exhibits no edema or tenderness.  Lymphadenopathy:    He has no cervical adenopathy.  Neurological: He is alert and oriented to person, place, and time.  Skin: Skin is warm and dry. No rash noted. He is not diaphoretic. No erythema.  Psychiatric: Mood and affect normal.  Vitals reviewed.     No results found for this  or any previous visit (from the past 48 hour(s)). No results found.  Assessment/Plan:  Labs and ultrasound were reviewed again. Patient has no signs of acute cholecystitis at this time but and likely experience biliary colic his pain is completely resolved. I recommended laparoscopic cholecystectomy for control of his symptoms the options of observation reviewed and the risks of bleeding infection conversion to an open procedure bile duct damage bile duct leak bowel injury any of which could require further surgery were all reviewed with him and his significant other the understood and agreed to proceed.  Because of the patient's remote location at this point I would recommend staying overnight for safety reasons as he has no lab line in no cell service.  Lattie Haw, MD, FACS

## 2017-07-08 NOTE — Patient Instructions (Addendum)
We have your surgery scheduled for 08/04/17 at [facility] with Dr.Cooper. Please see your blue pre-care sheet for surgery information. Please call our office if you have questions or concerns.

## 2017-07-09 DIAGNOSIS — Z79899 Other long term (current) drug therapy: Secondary | ICD-10-CM | POA: Diagnosis not present

## 2017-07-13 ENCOUNTER — Telehealth: Payer: Self-pay | Admitting: Surgery

## 2017-07-13 NOTE — Telephone Encounter (Signed)
I have called patient to discuss surgery information below. No answer. I have left a message on VM.    Pt advised of pre op date/time and sx date. Sx: 08/04/17 with Dr Ludwig Clarksooper--Laparoscopic cholecystectomy.  Pre op: 07/28/17 @ 9:00am--Office.  Patient made aware to call [phone], between 1-3:00pm the day before surgery, to find out what time to arrive.

## 2017-07-15 NOTE — Telephone Encounter (Signed)
Patient has called and all surgery information has been given to the patient. Patient understands.

## 2017-07-19 DIAGNOSIS — I720 Aneurysm of carotid artery: Secondary | ICD-10-CM | POA: Diagnosis not present

## 2017-07-19 DIAGNOSIS — J449 Chronic obstructive pulmonary disease, unspecified: Secondary | ICD-10-CM | POA: Diagnosis not present

## 2017-07-22 ENCOUNTER — Telehealth: Payer: Self-pay | Admitting: Surgery

## 2017-07-22 NOTE — Telephone Encounter (Signed)
Patient has been advised that his surgery date has changed. Patient is ok with this change.   Pt advised of pre op date/time and sx date. Sx: 08/06/17 with Dr Ludwig Clarks cholecystectomy.  Pre op: 07/28/17 @ 9:00am--office.   Patient made aware to call [phone], between 1-3:00pm the day before surgery, to find out what time to arrive.

## 2017-07-28 ENCOUNTER — Encounter
Admission: RE | Admit: 2017-07-28 | Discharge: 2017-07-28 | Disposition: A | Discharge status: Home / Self Care | Payer: Medicare Other | Source: Ambulatory Visit | Attending: Surgery | Admitting: Surgery

## 2017-07-28 DIAGNOSIS — Z01818 Encounter for other preprocedural examination: Secondary | ICD-10-CM | POA: Insufficient documentation

## 2017-07-28 HISTORY — DX: Gastro-esophageal reflux disease without esophagitis: K21.9

## 2017-07-28 HISTORY — DX: Sleep apnea, unspecified: G47.30

## 2017-07-28 LAB — BASIC METABOLIC PANEL
Anion gap: 5 (ref 5–15)
BUN: 11 mg/dL (ref 6–20)
CHLORIDE: 108 mmol/L (ref 101–111)
CO2: 28 mmol/L (ref 22–32)
Calcium: 8.8 mg/dL — ABNORMAL LOW (ref 8.9–10.3)
Creatinine, Ser: 0.98 mg/dL (ref 0.61–1.24)
Glucose, Bld: 103 mg/dL — ABNORMAL HIGH (ref 65–99)
POTASSIUM: 3.9 mmol/L (ref 3.5–5.1)
SODIUM: 141 mmol/L (ref 135–145)

## 2017-07-28 LAB — CBC WITH DIFFERENTIAL/PLATELET
Basophils Absolute: 0 10*3/uL (ref 0–0.1)
Basophils Relative: 0 %
EOS ABS: 0.2 10*3/uL (ref 0–0.7)
EOS PCT: 3 %
HCT: 44.2 % (ref 40.0–52.0)
Hemoglobin: 15.6 g/dL (ref 13.0–18.0)
LYMPHS ABS: 2.7 10*3/uL (ref 1.0–3.6)
Lymphocytes Relative: 35 %
MCH: 32.3 pg (ref 26.0–34.0)
MCHC: 35.3 g/dL (ref 32.0–36.0)
MCV: 91.5 fL (ref 80.0–100.0)
Monocytes Absolute: 0.4 10*3/uL (ref 0.2–1.0)
Monocytes Relative: 5 %
NEUTROS PCT: 57 %
Neutro Abs: 4.5 10*3/uL (ref 1.4–6.5)
PLATELETS: 153 10*3/uL (ref 150–440)
RBC: 4.84 MIL/uL (ref 4.40–5.90)
RDW: 13.8 % (ref 11.5–14.5)
WBC: 7.8 10*3/uL (ref 3.8–10.6)

## 2017-07-28 NOTE — Patient Instructions (Signed)
Your procedure is scheduled on:August 06, 2017 THURSDAY Report to Same Day Surgery on the 2nd floor in the [facility]. To find out your arrival time, please call [phone] between 1PM - 3PM on: Wednesday August 05, 2017  REMEMBER: Instructions that are not followed completely may result in serious medical risk up to and including death; or upon the discretion of your surgeon and anesthesiologist your surgery may need to be rescheduled.  Do not eat food or drink liquids after midnight. No gum chewing or hard candies.  You may however, drink CLEAR liquids up to 2 hours before you are scheduled to arrive at the hospital for your procedure.  Do not drink clear liquids within 2 hours of your scheduled arrival to the hospital as this may lead to your procedure being delayed or rescheduled.  Clear liquids include: - water  - apple juice without pulp - clear gatorade - black coffee or tea (NO milk, creamers, sugars) DO NOT drink anything not on this list.  No Alcohol for 24 hours before or after surgery.  No Smoking for 24 hours prior to surgery.  Notify your doctor if there is any change in your medical condition (cold, fever, infection).  Do not wear jewelry, make-up, hairpins, clips or nail polish.  Do not wear lotions, powders, or perfumes.   Do not shave 48 hours prior to surgery. Men may shave face and neck.  Contacts and dentures may not be worn into surgery.  Do not bring valuables to the hospital. [facility] is not responsible for any belongings or valuables.   TAKE THESE MEDICATIONS THE MORNING OF SURGERY WITH A SIP OF WATER:    Use CHG Soap or wipes as directed on instruction sheet.   Bring your C-PAP to the hospital with you in case you may have to spend the night.  Follow recommendations from Cardiologist, Pulmonologist or PCP regarding stopping Aspirin, Coumadin, Plavix, Eliquis, Pradaxa, or Pletal.  Stop Anti-inflammatories such as Advil, Aleve, Ibuprofen,  Motrin, Naproxen, Naprosyn, Goodie powder, or aspirin products. (May take Tylenol or Acetaminophen and Celebrex if needed.)  Stop supplements until after surgery. (May continue Vitamin D, Vitamin B, and multivitamin.)  If you are being admitted to the hospital overnight, leave your suitcase in the car. After surgery it may be brought to your room.  If you are being discharged the day of surgery, you will not be allowed to drive home. You will need someone to drive you home and stay with you that night.   If you are taking public transportation, you will need to have a responsible adult to with you.  Please call the number above if you have any questions about these instructions.

## 2017-07-28 NOTE — Pre-Procedure Instructions (Signed)
Patient reports that he has stopped taking his med until after surgery including his blood pressure medication. Pt has a b/p today of 181/99 - instructed to consult his md to see if he should restart the medication before surgery to bring the b/p down. Instructed not to take any medication day of surgery.

## 2017-08-05 MED ORDER — CEFAZOLIN SODIUM-DEXTROSE 2-4 GM/100ML-% IV SOLN
2.0000 g | INTRAVENOUS | Status: AC
  Administered 2017-08-06: 2 g via INTRAVENOUS

## 2017-08-06 ENCOUNTER — Ambulatory Visit
Admission: RE | Admit: 2017-08-06 | Discharge: 2017-08-06 | Disposition: A | Discharge status: Home / Self Care | Payer: Medicare Other | Source: Ambulatory Visit | Attending: Surgery | Admitting: Surgery

## 2017-08-06 ENCOUNTER — Encounter: Payer: Self-pay | Admitting: *Deleted

## 2017-08-06 ENCOUNTER — Inpatient Hospital Stay: Payer: Medicare Other | Admitting: Certified Registered"

## 2017-08-06 ENCOUNTER — Encounter
Admission: RE | Disposition: A | Discharge status: Home / Self Care | Payer: Self-pay | Source: Ambulatory Visit | Attending: Surgery

## 2017-08-06 DIAGNOSIS — F419 Anxiety disorder, unspecified: Secondary | ICD-10-CM | POA: Insufficient documentation

## 2017-08-06 DIAGNOSIS — K8080 Other cholelithiasis without obstruction: Secondary | ICD-10-CM | POA: Diagnosis present

## 2017-08-06 DIAGNOSIS — K8010 Calculus of gallbladder with chronic cholecystitis without obstruction: Secondary | ICD-10-CM | POA: Diagnosis not present

## 2017-08-06 DIAGNOSIS — I10 Essential (primary) hypertension: Secondary | ICD-10-CM | POA: Insufficient documentation

## 2017-08-06 DIAGNOSIS — G4730 Sleep apnea, unspecified: Secondary | ICD-10-CM | POA: Insufficient documentation

## 2017-08-06 DIAGNOSIS — F17210 Nicotine dependence, cigarettes, uncomplicated: Secondary | ICD-10-CM | POA: Diagnosis not present

## 2017-08-06 DIAGNOSIS — Z9981 Dependence on supplemental oxygen: Secondary | ICD-10-CM | POA: Diagnosis not present

## 2017-08-06 DIAGNOSIS — F329 Major depressive disorder, single episode, unspecified: Secondary | ICD-10-CM | POA: Insufficient documentation

## 2017-08-06 DIAGNOSIS — J449 Chronic obstructive pulmonary disease, unspecified: Secondary | ICD-10-CM | POA: Insufficient documentation

## 2017-08-06 DIAGNOSIS — K8050 Calculus of bile duct without cholangitis or cholecystitis without obstruction: Secondary | ICD-10-CM | POA: Diagnosis not present

## 2017-08-06 DIAGNOSIS — K219 Gastro-esophageal reflux disease without esophagitis: Secondary | ICD-10-CM | POA: Diagnosis not present

## 2017-08-06 HISTORY — PX: CHOLECYSTECTOMY: SHX55

## 2017-08-06 SURGERY — LAPAROSCOPIC CHOLECYSTECTOMY
Anesthesia: General | Wound class: Clean Contaminated

## 2017-08-06 MED ORDER — EPHEDRINE SULFATE 50 MG/ML IJ SOLN
INTRAMUSCULAR | Status: DC | PRN
  Administered 2017-08-06: 10 mg via INTRAVENOUS

## 2017-08-06 MED ORDER — CEFAZOLIN SODIUM-DEXTROSE 2-4 GM/100ML-% IV SOLN
INTRAVENOUS | Status: AC
  Filled 2017-08-06: qty 100

## 2017-08-06 MED ORDER — BUPIVACAINE-EPINEPHRINE (PF) 0.25% -1:200000 IJ SOLN
INTRAMUSCULAR | Status: DC | PRN
  Administered 2017-08-06: 30 mL via PERINEURAL

## 2017-08-06 MED ORDER — OXYCODONE-ACETAMINOPHEN 5-325 MG PO TABS: 1.0000 | ORAL_TABLET | ORAL | 0 refills | Status: DC | PRN

## 2017-08-06 MED ORDER — CHLORHEXIDINE GLUCONATE CLOTH 2 % EX PADS: 6.0000 | MEDICATED_PAD | Freq: Once | CUTANEOUS | Status: DC

## 2017-08-06 MED ORDER — BUPIVACAINE-EPINEPHRINE (PF) 0.25% -1:200000 IJ SOLN
INTRAMUSCULAR | Status: AC
  Filled 2017-08-06: qty 30

## 2017-08-06 MED ORDER — FENTANYL CITRATE (PF) 100 MCG/2ML IJ SOLN
INTRAMUSCULAR | Status: AC
  Administered 2017-08-06: 25 ug via INTRAVENOUS
  Filled 2017-08-06: qty 2

## 2017-08-06 MED ORDER — SUGAMMADEX SODIUM 200 MG/2ML IV SOLN
INTRAVENOUS | Status: DC | PRN
  Administered 2017-08-06: 227.8 mg via INTRAVENOUS

## 2017-08-06 MED ORDER — PROPOFOL 500 MG/50ML IV EMUL
INTRAVENOUS | Status: AC
  Filled 2017-08-06: qty 50

## 2017-08-06 MED ORDER — ACETAMINOPHEN 10 MG/ML IV SOLN
INTRAVENOUS | Status: AC
  Filled 2017-08-06: qty 100

## 2017-08-06 MED ORDER — ACETAMINOPHEN 10 MG/ML IV SOLN
INTRAVENOUS | Status: DC | PRN
  Administered 2017-08-06: 1000 mg via INTRAVENOUS

## 2017-08-06 MED ORDER — MIDAZOLAM HCL 2 MG/2ML IJ SOLN
INTRAMUSCULAR | Status: DC | PRN
  Administered 2017-08-06: 2 mg via INTRAVENOUS

## 2017-08-06 MED ORDER — HEPARIN SODIUM (PORCINE) 5000 UNIT/ML IJ SOLN
5000.0000 [IU] | Freq: Once | INTRAMUSCULAR | Status: AC
  Administered 2017-08-06: 5000 [IU] via SUBCUTANEOUS

## 2017-08-06 MED ORDER — ROCURONIUM BROMIDE 100 MG/10ML IV SOLN
INTRAVENOUS | Status: DC | PRN
  Administered 2017-08-06: 40 mg via INTRAVENOUS
  Administered 2017-08-06: 5 mg via INTRAVENOUS

## 2017-08-06 MED ORDER — MIDAZOLAM HCL 2 MG/2ML IJ SOLN
INTRAMUSCULAR | Status: AC
  Filled 2017-08-06: qty 2

## 2017-08-06 MED ORDER — SUCCINYLCHOLINE CHLORIDE 20 MG/ML IJ SOLN
INTRAMUSCULAR | Status: DC | PRN
  Administered 2017-08-06: 100 mg via INTRAVENOUS

## 2017-08-06 MED ORDER — FENTANYL CITRATE (PF) 250 MCG/5ML IJ SOLN
INTRAMUSCULAR | Status: AC
  Filled 2017-08-06: qty 5

## 2017-08-06 MED ORDER — KETOROLAC TROMETHAMINE 30 MG/ML IJ SOLN
INTRAMUSCULAR | Status: DC | PRN
  Administered 2017-08-06: 30 mg via INTRAVENOUS

## 2017-08-06 MED ORDER — FAMOTIDINE 20 MG PO TABS
ORAL_TABLET | ORAL | Status: AC
  Administered 2017-08-06: 20 mg via ORAL
  Filled 2017-08-06: qty 1

## 2017-08-06 MED ORDER — FENTANYL CITRATE (PF) 100 MCG/2ML IJ SOLN
INTRAMUSCULAR | Status: DC | PRN
  Administered 2017-08-06: 100 ug via INTRAVENOUS
  Administered 2017-08-06 (×3): 50 ug via INTRAVENOUS

## 2017-08-06 MED ORDER — OXYCODONE-ACETAMINOPHEN 5-325 MG PO TABS
1.0000 | ORAL_TABLET | ORAL | Status: DC | PRN
  Administered 2017-08-06: 1 via ORAL

## 2017-08-06 MED ORDER — DEXAMETHASONE SODIUM PHOSPHATE 10 MG/ML IJ SOLN
INTRAMUSCULAR | Status: DC | PRN
  Administered 2017-08-06: 10 mg via INTRAVENOUS

## 2017-08-06 MED ORDER — ONDANSETRON HCL 4 MG/2ML IJ SOLN
INTRAMUSCULAR | Status: DC | PRN
  Administered 2017-08-06: 4 mg via INTRAVENOUS

## 2017-08-06 MED ORDER — FAMOTIDINE 20 MG PO TABS
20.0000 mg | ORAL_TABLET | Freq: Once | ORAL | Status: AC
  Administered 2017-08-06: 20 mg via ORAL

## 2017-08-06 MED ORDER — PHENYLEPHRINE HCL 10 MG/ML IJ SOLN
INTRAMUSCULAR | Status: DC | PRN
  Administered 2017-08-06 (×2): 100 ug via INTRAVENOUS

## 2017-08-06 MED ORDER — FENTANYL CITRATE (PF) 100 MCG/2ML IJ SOLN
25.0000 ug | INTRAMUSCULAR | Status: AC | PRN
  Administered 2017-08-06 (×6): 25 ug via INTRAVENOUS

## 2017-08-06 MED ORDER — ONDANSETRON HCL 4 MG/2ML IJ SOLN: 4.0000 mg | Freq: Once | INTRAMUSCULAR | Status: DC | PRN

## 2017-08-06 MED ORDER — LIDOCAINE HCL (CARDIAC) 20 MG/ML IV SOLN
INTRAVENOUS | Status: DC | PRN
  Administered 2017-08-06: 50 mg via INTRAVENOUS

## 2017-08-06 MED ORDER — LACTATED RINGERS IV SOLN
INTRAVENOUS | Status: DC
  Administered 2017-08-06: 09:00:00 via INTRAVENOUS

## 2017-08-06 MED ORDER — LACTATED RINGERS IV SOLN
INTRAVENOUS | Status: DC | PRN
  Administered 2017-08-06: 09:00:00 via INTRAVENOUS

## 2017-08-06 MED ORDER — PROPOFOL 10 MG/ML IV BOLUS
INTRAVENOUS | Status: AC
  Filled 2017-08-06: qty 40

## 2017-08-06 MED ORDER — HEPARIN SODIUM (PORCINE) 5000 UNIT/ML IJ SOLN
INTRAMUSCULAR | Status: AC
  Administered 2017-08-06: 5000 [IU] via SUBCUTANEOUS
  Filled 2017-08-06: qty 1

## 2017-08-06 MED ORDER — OXYCODONE-ACETAMINOPHEN 5-325 MG PO TABS
ORAL_TABLET | ORAL | Status: AC
  Filled 2017-08-06: qty 1

## 2017-08-06 MED ORDER — GLYCOPYRROLATE 0.2 MG/ML IJ SOLN
INTRAMUSCULAR | Status: DC | PRN
  Administered 2017-08-06: 0.2 mg via INTRAVENOUS

## 2017-08-06 MED ORDER — PROPOFOL 10 MG/ML IV BOLUS
INTRAVENOUS | Status: DC | PRN
  Administered 2017-08-06: 200 mg via INTRAVENOUS

## 2017-08-06 SURGICAL SUPPLY — 42 items
ADHESIVE MASTISOL STRL (MISCELLANEOUS) ×2 IMPLANT
APPLIER CLIP ROT 10 11.4 M/L (STAPLE) ×2
BLADE SURG SZ11 CARB STEEL (BLADE) ×2 IMPLANT
CANISTER SUCT 1200ML W/VALVE (MISCELLANEOUS) ×2 IMPLANT
CATH CHOLANGI 4FR 420404F (CATHETERS) IMPLANT
CHLORAPREP W/TINT 26ML (MISCELLANEOUS) ×2 IMPLANT
CLIP APPLIE ROT 10 11.4 M/L (STAPLE) ×1 IMPLANT
CONRAY 60ML FOR OR (MISCELLANEOUS) IMPLANT
DRAPE C-ARM XRAY 36X54 (DRAPES) IMPLANT
ELECT REM PT RETURN 9FT ADLT (ELECTROSURGICAL) ×2
ELECTRODE REM PT RTRN 9FT ADLT (ELECTROSURGICAL) ×1 IMPLANT
GAUZE SPONGE NON-WVN 2X2 STRL (MISCELLANEOUS) ×4 IMPLANT
GLOVE BIO SURGEON STRL SZ8 (GLOVE) ×10 IMPLANT
GOWN STRL REUS W/ TWL LRG LVL3 (GOWN DISPOSABLE) ×3 IMPLANT
GOWN STRL REUS W/TWL LRG LVL3 (GOWN DISPOSABLE) ×3
IRRIGATION STRYKERFLOW (MISCELLANEOUS) ×1 IMPLANT
IRRIGATOR STRYKERFLOW (MISCELLANEOUS) ×2
IV CATH ANGIO 12GX3 LT BLUE (NEEDLE) ×2 IMPLANT
IV NS 1000ML (IV SOLUTION) ×1
IV NS 1000ML BAXH (IV SOLUTION) ×1 IMPLANT
JACKSON PRATT 10 (INSTRUMENTS) IMPLANT
KIT RM TURNOVER STRD PROC AR (KITS) ×2 IMPLANT
LABEL OR SOLS (LABEL) ×2 IMPLANT
NDL SAFETY 22GX1.5 (NEEDLE) ×2 IMPLANT
NEEDLE VERESS 14GA 120MM (NEEDLE) ×2 IMPLANT
NS IRRIG 500ML POUR BTL (IV SOLUTION) ×2 IMPLANT
PACK LAP CHOLECYSTECTOMY (MISCELLANEOUS) ×2 IMPLANT
POUCH SPECIMEN RETRIEVAL 10MM (ENDOMECHANICALS) ×2 IMPLANT
SCISSORS METZENBAUM CVD 33 (INSTRUMENTS) ×2 IMPLANT
SLEEVE ENDOPATH XCEL 5M (ENDOMECHANICALS) ×4 IMPLANT
SPONGE LAP 18X18 5 PK (GAUZE/BANDAGES/DRESSINGS) ×2 IMPLANT
SPONGE VERSALON 2X2 STRL (MISCELLANEOUS) ×4
SPONGE VERSALON 4X4 4PLY (MISCELLANEOUS) IMPLANT
STRIP CLOSURE SKIN 1/2X4 (GAUZE/BANDAGES/DRESSINGS) ×2 IMPLANT
SUT MNCRL 4-0 (SUTURE) ×1
SUT MNCRL 4-0 27XMFL (SUTURE) ×1
SUT VICRYL 0 AB UR-6 (SUTURE) ×2 IMPLANT
SUTURE MNCRL 4-0 27XMF (SUTURE) ×1 IMPLANT
SYR 20CC LL (SYRINGE) ×2 IMPLANT
TROCAR XCEL NON-BLD 11X100MML (ENDOMECHANICALS) ×2 IMPLANT
TROCAR XCEL NON-BLD 5MMX100MML (ENDOMECHANICALS) ×2 IMPLANT
TUBING INSUFFLATOR HI FLOW (MISCELLANEOUS) ×2 IMPLANT

## 2017-08-06 NOTE — Anesthesia Post-op Follow-up Note (Signed)
Anesthesia QCDR form completed.        

## 2017-08-06 NOTE — Transfer of Care (Signed)
Immediate Anesthesia Transfer of Care Note  Patient: Eric Lawrence  Procedure(s) Performed: LAPAROSCOPIC CHOLECYSTECTOMY (N/A )  Patient Location: PACU  Anesthesia Type:General  Level of Consciousness: awake  Airway & Oxygen Therapy: Patient Spontanous Breathing  Post-op Assessment: Report given to RN  Post vital signs: Reviewed  Last Vitals:  Vitals:   08/06/17 1025 08/06/17 1026  BP: (!) 205/96   Pulse: 79 79  Resp: 18 18  Temp: (!) 36.1 C (!) 36.1 C  SpO2: 98% 96%    Last Pain:  Vitals:   08/06/17 0810  TempSrc: Oral  PainSc: 0-No pain         Complications: No apparent anesthesia complications

## 2017-08-06 NOTE — Anesthesia Procedure Notes (Signed)
Procedure Name: Intubation Date/Time: 08/06/2017 9:46 AM Performed by: Timoteo Expose Pre-anesthesia Checklist: Patient identified, Emergency Drugs available and Suction available Patient Re-evaluated:Patient Re-evaluated prior to induction Oxygen Delivery Method: Circle system utilized Preoxygenation: Pre-oxygenation with 100% oxygen Induction Type: IV induction Ventilation: Mask ventilation without difficulty Laryngoscope Size: Mac, 3 and 4 Grade View: Grade II Tube type: Oral Tube size: 7.5 mm Number of attempts: 1 Airway Equipment and Method: Stylet Placement Confirmation: ETT inserted through vocal cords under direct vision,  positive ETCO2,  CO2 detector and breath sounds checked- equal and bilateral Secured at: 23 cm Tube secured with: Tape Dental Injury: Teeth and Oropharynx as per pre-operative assessment

## 2017-08-06 NOTE — Discharge Instructions (Signed)
Remove dressing in 24 hours. °May shower in 24 hours. °Leave paper strips in place. °Resume all home medications. °Follow-up with Dr. Cooper in 10 days. ° °AMBULATORY SURGERY  °DISCHARGE INSTRUCTIONS ° ° °1) The drugs that you were given will stay in your system until tomorrow so for the next 24 hours you should not: ° °A) Drive an automobile °B) Make any legal decisions °C) Drink any alcoholic beverage ° ° °2) You may resume regular meals tomorrow.  Today it is better to start with liquids and gradually work up to solid foods. ° °You may eat anything you prefer, but it is better to start with liquids, then soup and crackers, and gradually work up to solid foods. ° ° °3) Please notify your doctor immediately if you have any unusual bleeding, trouble breathing, redness and pain at the surgery site, drainage, fever, or pain not relieved by medication. ° ° ° °4) Additional Instructions: ° ° ° ° ° ° ° °Please contact your physician with any problems or Same Day Surgery at [phone], Monday through Friday 6 am to 4 pm, or [employer] [facility] [place] [facility] number at [phone]. °

## 2017-08-06 NOTE — Anesthesia Postprocedure Evaluation (Signed)
Anesthesia Post Note  Patient: Eric Lawrence  Procedure(s) Performed: LAPAROSCOPIC CHOLECYSTECTOMY (N/A )  Patient location during evaluation: PACU Anesthesia Type: General Level of consciousness: awake and alert Pain management: pain level controlled Vital Signs Assessment: post-procedure vital signs reviewed and stable Respiratory status: spontaneous breathing and respiratory function stable Cardiovascular status: stable Anesthetic complications: no     Last Vitals:  Vitals:   08/06/17 1025 08/06/17 1026  BP: (!) 205/96   Pulse: 79 79  Resp: 18 18  Temp: (!) 36.1 C (!) 36.1 C  SpO2: 98% 96%    Last Pain:  Vitals:   08/06/17 1025  TempSrc:   PainSc: Asleep                 KEPHART,WILLIAM K

## 2017-08-06 NOTE — Anesthesia Preprocedure Evaluation (Signed)
Anesthesia Evaluation  Patient identified by MRN, date of birth, ID band Patient awake    Reviewed: Allergy & Precautions, NPO status , Patient's Chart, lab work & pertinent test results  History of Anesthesia Complications Negative for: history of anesthetic complications  Airway Mallampati: II       Dental   Pulmonary asthma , sleep apnea , COPD,  COPD inhaler and oxygen dependent, Current Smoker,           Cardiovascular hypertension, Pt. on medications (-) Past MI and (-) CHF (-) dysrhythmias (-) Valvular Problems/Murmurs     Neuro/Psych Anxiety Depression    GI/Hepatic Neg liver ROS, GERD  Medicated and Controlled,  Endo/Other  neg diabetes  Renal/GU negative Renal ROS     Musculoskeletal   Abdominal   Peds  Hematology   Anesthesia Other Findings   Reproductive/Obstetrics                             Anesthesia Physical Anesthesia Plan  ASA: III  Anesthesia Plan: General   Post-op Pain Management:    Induction: Intravenous  PONV Risk Score and Plan:   Airway Management Planned: Oral ETT  Additional Equipment:   Intra-op Plan:   Post-operative Plan:   Informed Consent: I have reviewed the patients History and Physical, chart, labs and discussed the procedure including the risks, benefits and alternatives for the proposed anesthesia with the patient or authorized representative who has indicated his/her understanding and acceptance.     Plan Discussed with:   Anesthesia Plan Comments:         Anesthesia Quick Evaluation

## 2017-08-06 NOTE — Op Note (Signed)
Laparoscopic Cholecystectomy  Pre-operative Diagnosis: Biliary colic  Post-operative Diagnosis: Biliary colic  Procedure: Laparoscopic cholecystectomy  Surgeon: Adah Salvage. Excell Seltzer, MD FACS  Anesthesia: Gen. with endotracheal tube  Assistant: RN  Procedure Details  The patient was seen again in the Holding Room. The benefits, complications, treatment options, and expected outcomes were discussed with the patient. The risks of bleeding, infection, recurrence of symptoms, failure to resolve symptoms, bile duct damage, bile duct leak, retained common bile duct stone, bowel injury, any of which could require further surgery and/or ERCP, stent, or papillotomy were reviewed with the patient. The likelihood of improving the patient's symptoms with return to their baseline status is good.  The patient and/or family concurred with the proposed plan, giving informed consent.  The patient was taken to Operating Room, identified as Eric Lawrence and the procedure verified as Laparoscopic Cholecystectomy.  A Time Out was held and the above information confirmed.  Prior to the induction of general anesthesia, antibiotic prophylaxis was administered. VTE prophylaxis was in place. General endotracheal anesthesia was then administered and tolerated well. After the induction, the abdomen was prepped with Chloraprep and draped in the sterile fashion. The patient was positioned in the supine position.  Local anesthetic  was injected into the skin near the umbilicus and an incision made. The Veress needle was placed. Pneumoperitoneum was then created with CO2 and tolerated well without any adverse changes in the patient's vital signs. A 5mm port was placed in the periumbilical position and the abdominal cavity was explored.  Two 5-mm ports were placed in the right upper quadrant and a 12 mm epigastric port was placed all under direct vision. All skin incisions  were infiltrated with a local anesthetic agent before  making the incision and placing the trocars.   The patient was positioned  in reverse Trendelenburg, tilted slightly to the patient's left.  The gallbladder was identified, the fundus grasped and retracted cephalad. Adhesions were lysed bluntly. The infundibulum was grasped and retracted laterally, exposing the peritoneum overlying the triangle of Calot. This was then divided and exposed in a blunt fashion. A critical view of the cystic duct and cystic artery was obtained.  The cystic duct was clearly identified and bluntly dissected.   The cystic duct was well identified as it entered the infundibulum of the gallbladder. Here it was doubly clipped and divided. The cystic artery was doubly clipped and divided.  The gallbladder was taken from the gallbladder fossa in a retrograde fashion with the electrocautery. As this progressed to separate arterial bleeders were identified one on the lateral side of the gallbladder at the peritoneal reflection and one on the medial side of the gallbladder at the peritoneal reflection. Both of these were up on the gallbladder body or fundus well away from the infundibular or portal area. They were doubly clipped and dissection progressed. The gallbladder was removed and placed in an Endocatch bag. The liver bed was irrigated and inspected. Hemostasis was achieved with the electrocautery. Copious irrigation was utilized and was repeatedly aspirated until clear.  The gallbladder and Endocatch sac were then removed through the epigastric port site.   Complete inspection of the gallbladder fossa were those arterial bleeders had an present was performed to ensure that there was no bleeding and that the clips were adequately applied.  Inspection of the right upper quadrant was performed. No bleeding, bile duct injury or leak, or bowel injury was noted. Pneumoperitoneum was released.  The epigastric port site was closed with  figure-of-eight 0 Vicryl sutures. 4-0 subcuticular  Monocryl was used to close the skin. Steristrips and Mastisol and sterile dressings were  applied.  The patient was then extubated and brought to the recovery room in stable condition. Sponge, lap, and needle counts were correct at closure and at the conclusion of the case.   Findings: Chronic Cholecystitis   Estimated Blood Loss: 25 cc         Drains: None         Specimens: Gallbladder           Complications: none               Eric Lawrence E. Excell Seltzer, MD, FACS

## 2017-08-06 NOTE — Progress Notes (Signed)
Preoperative Review   Patient is met in the preoperative holding area. The history is reviewed in the chart and with the patient. I personally reviewed the options and rationale as well as the risks of this procedure that have been previously discussed with the patient. All questions asked by the patient and/or family were answered to their satisfaction. Patient has now obtained a land line and a functional cell phone and his significant other will be staying with him at his home today. He prefers to go home rather than stay overnight in the hospital as previously planned due to changing social situations. Patient agrees to proceed with this procedure at this time.  Florene Glen M.D. FACS

## 2017-08-07 ENCOUNTER — Telehealth: Payer: Self-pay

## 2017-08-07 ENCOUNTER — Telehealth: Payer: Self-pay | Admitting: General Surgery

## 2017-08-07 LAB — SURGICAL PATHOLOGY

## 2017-08-07 NOTE — Telephone Encounter (Signed)
Post-op call made to patient at this time. Spoke with Colin Ina, patient's roommate, was given verbal permission from patient to speak with her . Post-op interview questions below.  1. How are you feeling? Some discomfort  2. Is your pain controlled?  Yes  3. What are you doing for the pain? Taking oxycodone  4. Are you having any Nausea or Vomiting? None  5. Are you having any Fever or Chills? None  6. Are you having any Constipation or Diarrhea? Has not yet to have a bowel movement.  Advised patient to try some Miralax and to drink at least 8-9 glasses of water while taking medication.   7. Is there any Swelling or Bruising you are concerned about? None  8. Do you have any questions or concerns at this time? None   Discussion: Has his appetite back eating a soft diet  Removed bandages yesterday   Reminded of appointment on 10/15 at 10:15AM

## 2017-08-07 NOTE — Telephone Encounter (Signed)
Patient has called and stated that [facility] would not fill his oxycodone 5-325 1-2 tablets every 4 hours as needed. This was given on 08/06/17 after he was discharged by Dr Excell Seltzer. Patient had a Laparoscopic cholecystectomy on 08/06/17.  The pharmacy stated that they would not fill the prescription because of continued narcotics being prescribed. Please call patient.

## 2017-08-17 ENCOUNTER — Ambulatory Visit (INDEPENDENT_AMBULATORY_CARE_PROVIDER_SITE_OTHER): Payer: Medicare Other | Admitting: General Surgery

## 2017-08-17 ENCOUNTER — Encounter: Payer: Self-pay | Admitting: General Surgery

## 2017-08-17 VITALS — BP 150/84 | HR 87 | Temp 97.9°F | Wt 244.0 lb

## 2017-08-17 DIAGNOSIS — Z4889 Encounter for other specified surgical aftercare: Secondary | ICD-10-CM

## 2017-08-17 NOTE — Patient Instructions (Signed)
GENERAL POST-OPERATIVE PATIENT INSTRUCTIONS   WOUND CARE INSTRUCTIONS:  Keep a dry clean dressing on the wound if there is drainage. The initial bandage may be removed after 24 hours.  Once the wound has quit draining you may leave it open to air.  If clothing rubs against the wound or causes irritation and the wound is not draining you may cover it with a dry dressing during the daytime.  Try to keep the wound dry and avoid ointments on the wound unless directed to do so.  If the wound becomes bright red and painful or starts to drain infected material that is not clear, please contact your physician immediately.  If the wound is mildly pink and has a thick firm ridge underneath it, this is normal, and is referred to as a healing ridge.  This will resolve over the next 4-6 weeks.  BATHING: You may shower if you have been informed of this by your surgeon. However, Please do not submerge in a tub, hot tub, or pool until incisions are completely sealed or have been told by your surgeon that you may do so.  DIET:  You may eat any foods that you can tolerate.  It is a good idea to eat a high fiber diet and take in plenty of fluids to prevent constipation.  If you do become constipated you may want to take a mild laxative or take ducolax tablets on a daily basis until your bowel habits are regular.  Constipation can be very uncomfortable, along with straining, after recent surgery.  ACTIVITY:  You are encouraged to cough and deep breath or use your incentive spirometer if you were given one, every 15-30 minutes when awake.  This will help prevent respiratory complications and low grade fevers post-operatively if you had a general anesthetic.  You may want to hug a pillow when coughing and sneezing to add additional support to the surgical area, if you had abdominal or chest surgery, which will decrease pain during these times.  You are encouraged to walk and engage in light activity for the next two weeks.  You  should not lift more than 20 pounds, until 09/17/2017 as it could put you at increased risk for complications.  Twenty pounds is roughly equivalent to a plastic bag of groceries. At that time- Listen to your body when lifting, if you have pain when lifting, stop and then try again in a few days. Soreness after doing exercises or activities of daily living is normal as you get back in to your normal routine.  MEDICATIONS:  Try to take narcotic medications and anti-inflammatory medications, such as tylenol, ibuprofen, naprosyn, etc., with food.  This will minimize stomach upset from the medication.  Should you develop nausea and vomiting from the pain medication, or develop a rash, please discontinue the medication and contact your physician.  You should not drive, make important decisions, or operate machinery when taking narcotic pain medication.  SUNBLOCK Use sun block to incision area over the next year if this area will be exposed to sun. This helps decrease scarring and will allow you avoid a permanent darkened area over your incision.  QUESTIONS:  Please feel free to call our office if you have any questions, and we will be glad to assist you. [phone]    

## 2017-08-17 NOTE — Progress Notes (Signed)
Outpatient Surgical Follow Up  08/17/2017  Eric Lawrence is an 51 y.o. male.   Chief Complaint  Patient presents with  . Routine Post Op    LAPAROSCOPIC CHOLECYSTECTOMY 08/06/17 dr. Excell Seltzer    HPI: 51 year old male returns to clinic for follow-up 10 days status post laparoscopic cholecystectomy. Patient reports he is having no pain. He is eating well and having normal bowel function. He denies any fevers, chills, chest pain, shortness breath, nausea, vomiting, diarrhea, constipation. He's been very happy with his surgical progress.  Past Medical History:  Diagnosis Date  . Acute cholecystitis 06/29/2017  . 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 Airway hyperreactivity 04/19/2015  . Anxiety   . Anxiety disorder 04/19/2015  . Arthritis 04/19/2015  . At risk for injury 04/19/2015  . BP (high blood pressure) 04/19/2015  . CAFL (chronic airflow limitation) (HCC) 04/19/2015  . Cholecystitis 06/29/2017  . Chronic gastritis 04/19/2015  . Compulsive tobacco user syndrome 04/19/2015  . COPD (chronic obstructive pulmonary disease) (HCC)   . Depression   . Encounter for immunization 04/19/2015  . GERD (gastroesophageal reflux disease)   . H/O elevated lipids 04/19/2015  . Hypertension   . Mechanical and motor problems with internal organs 04/19/2015  . Obesity 09/10/2015  . Pneumococcal vaccination given 04/19/2015  . Sleep apnea   . Trigger thumb of left hand 04/15/2017    Past Surgical History:  Procedure Laterality Date  . CHOLECYSTECTOMY N/A 08/06/2017   Procedure: LAPAROSCOPIC CHOLECYSTECTOMY;  Surgeon: Lattie Haw, MD;  Location: [facility] ORS;  Service: General;  Laterality: N/A;  . cyst removal from wrist      Family History  Problem Relation Age of Onset  . Stroke Mother 79  . Diabetes Mother   . Kidney disease Mother   . Hypertension Mother   . COPD Maternal Aunt   . Diabetes Maternal Grandmother     Social History:  reports that he has been smoking Cigarettes.  He has been  smoking about 0.25 packs per day. He has never used smokeless tobacco. He reports that he does not drink alcohol or use drugs.  Allergies: No Known Allergies  Medications reviewed.    ROS A multipoint review of systems was completed, all pertinent positives and negatives are documented in the history of present illness and remainder are negative   BP (!) 150/84   Pulse 87   Temp 97.9 F (36.6 C) (Oral)   Wt 110.7 kg (244 lb)   BMI 33.09 kg/m   Physical Exam Gen.: No acute distress Chest: Clear to auscultation Heart: Regular rate and rhythm Abdomen: Soft, nontender, nondistended. Well approximated lap scopic surgery sites with some hyperemia but no evidence of erythema or drainage.    No results found for this or any previous visit (from the past 48 hour(s)). No results found.  Assessment/Plan:  1. Aftercare following surgery 51 year old male status post laparoscopic cholecystectomy by Dr. Excell Seltzer. Doing very well. Pathology reviewed with the patient. Discussed anticipated return to normal activities as well as signs and symptoms of infection. He will follow-up in clinic on an as-needed basis.     Ricarda Frame, MD FACS General Surgeon  08/17/2017,10:31 AM

## 2017-08-18 DIAGNOSIS — I720 Aneurysm of carotid artery: Secondary | ICD-10-CM | POA: Diagnosis not present

## 2017-08-18 DIAGNOSIS — J449 Chronic obstructive pulmonary disease, unspecified: Secondary | ICD-10-CM | POA: Diagnosis not present

## 2017-09-08 ENCOUNTER — Encounter: Payer: Self-pay | Admitting: Family Medicine

## 2017-09-08 ENCOUNTER — Other Ambulatory Visit: Payer: Self-pay | Admitting: Family Medicine

## 2017-09-08 ENCOUNTER — Ambulatory Visit (INDEPENDENT_AMBULATORY_CARE_PROVIDER_SITE_OTHER): Payer: Medicare Other | Admitting: Family Medicine

## 2017-09-08 VITALS — BP 158/88 | HR 78 | Temp 98.2°F | Resp 16 | Ht 72.0 in | Wt 252.0 lb

## 2017-09-08 DIAGNOSIS — E782 Mixed hyperlipidemia: Secondary | ICD-10-CM

## 2017-09-08 DIAGNOSIS — Z114 Encounter for screening for human immunodeficiency virus [HIV]: Secondary | ICD-10-CM

## 2017-09-08 DIAGNOSIS — J410 Simple chronic bronchitis: Secondary | ICD-10-CM

## 2017-09-08 DIAGNOSIS — Z23 Encounter for immunization: Secondary | ICD-10-CM

## 2017-09-08 DIAGNOSIS — B070 Plantar wart: Secondary | ICD-10-CM

## 2017-09-08 DIAGNOSIS — Z1159 Encounter for screening for other viral diseases: Secondary | ICD-10-CM

## 2017-09-08 DIAGNOSIS — R7309 Other abnormal glucose: Secondary | ICD-10-CM

## 2017-09-08 DIAGNOSIS — I10 Essential (primary) hypertension: Secondary | ICD-10-CM

## 2017-09-08 DIAGNOSIS — Z125 Encounter for screening for malignant neoplasm of prostate: Secondary | ICD-10-CM

## 2017-09-08 DIAGNOSIS — Z0000 Encounter for general adult medical examination without abnormal findings: Secondary | ICD-10-CM

## 2017-09-08 MED ORDER — ALBUTEROL SULFATE HFA 108 (90 BASE) MCG/ACT IN AERS: 2.0000 | INHALATION_SPRAY | RESPIRATORY_TRACT | 3 refills | Status: DC | PRN

## 2017-09-08 NOTE — Patient Instructions (Addendum)
Thank you for coming to the clinic today.  1.   For your Foot, it looks like you have a "Plantar Wart" - These warts can be painful and irritating, worse when you walk and put pressure on them. Occasionally they can bleed. - Warts are caused by a virus out in the environment, and we are all commonly exposed to it, but some people develop warts more than others - Calluses on feet can be similar (except they are not caused by a virus)  Follow-up with Podiatrist as planned to treat this  Treatment:  - Start soaking entire foot in lukewarm water, and use a [employer] to file them down every night  - After filing, use the topical medicine Salicylic Acid (or may get over the counter) and apply it directly to the raw skin surface  - Cover with an adhesive or tape (may use black electrical tape) and then go to sleep, remove adhesive in the morning  - Repeat process nightly for weeks to months until removed  - If this is not successful after 3 months, I can refer you to the Podiatrist (Foot Doctor) or Dermatologist for other therapies such as cryotherapy (freezing), surgical removal, or injections  DUE for FASTING BLOOD WORK (no food or drink after midnight before the lab appointment, only water or coffee without cream/sugar on the morning of)  SCHEDULE "Lab Only" visit in the morning at the clinic for lab draw in 3 MONTHS   - Make sure Lab Only appointment is at about 1 week before your next appointment, so that results will be available  For Lab Results, once available within 2-3 days of blood draw, you can can log in to MyChart online to view your results and a brief explanation. Also, we can discuss results at next follow-up visit.   Please schedule a Follow-up Appointment to: Return in about 3 months (around 12/09/2017) for Annual Physical.  If you have any other questions or concerns, please feel free to call the clinic or send a message through MyChart. You may also schedule an earlier  appointment if necessary.  Additionally, you may be receiving a survey about your experience at our clinic within a few days to 1 week by e-mail or mail. We value your feedback.  Saralyn PilarAlexander Karamalegos, DO [facility]

## 2017-09-08 NOTE — Progress Notes (Signed)
Subjective:    Patient ID: Eric Lawrence, male    DOB: 11/22/1965, 51 y.o.   MRN: [id number]  Eric Lawrence is a 51 y.o. male presenting on 09/08/2017 for Foot Pain (left side heel pain onset 03/18 pt has appointment with podiatrist )   HPI   LEFT HEEL / FOOT PAIN, Plantar Wart - Reports he had problem since February 2018, states he stepped on something and had a "knot" on back of heel, has intermittent episodes of sharp pain on Left posterior heel, affects his ambulation at times. He noticed a small area of dry skin possible "wart" on bottom of foot, he used sand paper to file it down, but still has intermittent problems, some days worse than others. Not painful if resting. - Never dx with other foot problem before, never had plantar fasciitis, does not have stiffness or difficulty with pain first thing in morning - he has already scheduled to [facility] on 09/17/17, he does not need a referral - Not tried any other topical treatment OTC - Denies fever/chills, redness, swelling, drainage of pus, other lesions  Health Maintenance: - Due for Flu Shot, will receive today  Depression screen [facility] 2/9 04/16/2017 12/17/2015 09/10/2015  Decreased Interest 1 0 0  Down, Depressed, Hopeless 2 0 1  PHQ - 2 Score 3 0 1  Altered sleeping 2 - 0  Tired, decreased energy 3 - 2  Change in appetite 1 - 0  Feeling bad or failure about yourself  3 - 3  Trouble concentrating 2 - 2  Moving slowly or fidgety/restless 3 - 3  Suicidal thoughts 0 - 1  PHQ-9 Score 17 - 12  Difficult doing work/chores - - Somewhat difficult    Social History   Tobacco Use  . Smoking status: Current Every Day Smoker    Packs/day: 0.25    Types: Cigarettes  . Smokeless tobacco: Never Used  Substance Use Topics  . Alcohol use: No    Alcohol/week: 0.0 oz  . Drug use: No    Review of Systems Per HPI unless specifically indicated above     Objective:    BP (!) 158/88 (BP Location: Left Arm,  Patient Position: Sitting, Cuff Size: Normal)   Pulse 78   Temp 98.2 F (36.8 C) (Oral)   Resp 16   Ht 6' (1.829 m)   Wt 252 lb (114.3 kg)   BMI 34.18 kg/m   Wt Readings from Last 3 Encounters:  09/08/17 252 lb (114.3 kg)  08/17/17 244 lb (110.7 kg)  08/06/17 251 lb (113.9 kg)    Physical Exam  Constitutional: He is oriented to person, place, and time. He appears well-developed and well-nourished. No distress.  Well-appearing, comfortable, cooperative  HENT:  Head: Normocephalic and atraumatic.  Mouth/Throat: Oropharynx is clear and moist.  Eyes: Conjunctivae are normal. Right eye exhibits no discharge. Left eye exhibits no discharge.  Cardiovascular: Normal rate.  Pulmonary/Chest: Effort normal.  Musculoskeletal: He exhibits no edema.  Neurological: He is alert and oriented to person, place, and time.  Skin: Skin is warm and dry. No rash noted. He is not diaphoretic. No erythema.  Left foot Inspection: mostly normal appearance some dry skin and callus formation on plantar aspect. Localized tender spot with small plantar wart with localized firmness. Palpation: Mild tender on plantar wart. No medial or arch pain on palpation, plantar fascia non tender, MTPs non tender ROM: full active ROM Strength: distal intact Neurovascular: distal intact  Psychiatric:  He has a normal mood and affect. His behavior is normal.  Well groomed, good eye contact, normal speech and thoughts  Nursing note and vitals reviewed.  Results for orders placed or performed during the hospital encounter of 08/06/17  Surgical pathology  Result Value Ref Range   SURGICAL PATHOLOGY      Surgical Pathology CASE: [id number] PATIENT: Madelyn BrunnerODNEY Jaggers Surgical Pathology Report     SPECIMEN SUBMITTED: A. Gallbladder  CLINICAL HISTORY: None provided  PRE-OPERATIVE DIAGNOSIS: Biliary colic  POST-OPERATIVE DIAGNOSIS: Same as pre-op     DIAGNOSIS: A. GALLBLADDER; CHOLECYSTECTOMY: - CHRONIC  CHOLECYSTITIS AND CHOLELITHIASIS. - NEGATIVE FOR DYSPLASIA AND MALIGNANCY.   GROSS DESCRIPTION:  A. Labeled: gallbladder  Size of specimen: 10.3 x 3.0 x 2.1 cm  Previously opened: focally  External surface: smooth pink-tan  Wall thickness: 0.5cm  Mucosa: velvety red  Stones present: yes, one ovoid green to yellow, 3.4 x 2.5 x 2.5 cm  Other findings: duct margin inked blue  Block summary: 1 - representative section and en face cystic duct margin    Final Diagnosis performed by Glenice Bowana Baker, MD.  Electronically signed 08/07/2017 12:02:43PM    The electronic signature indicates that the named Attending Pathologist has evaluated the specimen   Technical component performed at [facility], [address], [place], [place] [address] Lab: [phone] Dir: Titus DubinWilliam F. Cato MulliganHancock, MD  Professional component performed at [facility], [facility], [address], [place], [place] [address] Lab: [phone] Dir: Georgiann Cockerara C. Rubinas, MD        Assessment & Plan:   Problem List Items Addressed This Visit    None    Visit Diagnoses    Plantar wart of left foot    -  Primary  Clinically most consistent with localized pain over L heel plantar wart. No secondary complication or infection. Does not have multiple lesions. - Exam and history not consistent with plantar fasciitis or other MSK foot defect/deformity  Plan: 1. Reassurance on diagnosis, could try topical OTC otherwise will be a slow process 2. Agree with referral to Podiatry - [facility], keep apt 3. Follow-up PRN    Needs flu shot       Relevant Orders   Flu Vaccine QUAD 36+ mos IM (Completed)      No orders of the defined types were placed in this encounter.    Follow up plan: Return in about 3 months (around 12/09/2017) for Annual Physical.  Future labs ordered for 12/2017.  Saralyn PilarAlexander Karamalegos, DO [facility] 09/08/2017,  12:56 PM

## 2017-09-17 DIAGNOSIS — M79672 Pain in left foot: Secondary | ICD-10-CM | POA: Diagnosis not present

## 2017-09-17 DIAGNOSIS — B070 Plantar wart: Secondary | ICD-10-CM | POA: Diagnosis not present

## 2017-09-18 DIAGNOSIS — J449 Chronic obstructive pulmonary disease, unspecified: Secondary | ICD-10-CM | POA: Diagnosis not present

## 2017-09-18 DIAGNOSIS — I720 Aneurysm of carotid artery: Secondary | ICD-10-CM | POA: Diagnosis not present

## 2017-10-08 DIAGNOSIS — B070 Plantar wart: Secondary | ICD-10-CM | POA: Diagnosis not present

## 2017-10-08 DIAGNOSIS — M79672 Pain in left foot: Secondary | ICD-10-CM | POA: Diagnosis not present

## 2017-10-18 DIAGNOSIS — I720 Aneurysm of carotid artery: Secondary | ICD-10-CM | POA: Diagnosis not present

## 2017-10-18 DIAGNOSIS — J449 Chronic obstructive pulmonary disease, unspecified: Secondary | ICD-10-CM | POA: Diagnosis not present

## 2017-11-18 DIAGNOSIS — I720 Aneurysm of carotid artery: Secondary | ICD-10-CM | POA: Diagnosis not present

## 2017-11-18 DIAGNOSIS — J449 Chronic obstructive pulmonary disease, unspecified: Secondary | ICD-10-CM | POA: Diagnosis not present

## 2017-12-07 ENCOUNTER — Other Ambulatory Visit: Payer: Medicaid Other

## 2017-12-07 DIAGNOSIS — E782 Mixed hyperlipidemia: Secondary | ICD-10-CM | POA: Diagnosis not present

## 2017-12-07 DIAGNOSIS — Z0000 Encounter for general adult medical examination without abnormal findings: Secondary | ICD-10-CM

## 2017-12-07 DIAGNOSIS — Z114 Encounter for screening for human immunodeficiency virus [HIV]: Secondary | ICD-10-CM

## 2017-12-07 DIAGNOSIS — R7309 Other abnormal glucose: Secondary | ICD-10-CM | POA: Diagnosis not present

## 2017-12-07 DIAGNOSIS — I10 Essential (primary) hypertension: Secondary | ICD-10-CM

## 2017-12-07 DIAGNOSIS — Z1159 Encounter for screening for other viral diseases: Secondary | ICD-10-CM | POA: Diagnosis not present

## 2017-12-07 DIAGNOSIS — Z125 Encounter for screening for malignant neoplasm of prostate: Secondary | ICD-10-CM | POA: Diagnosis not present

## 2017-12-08 LAB — CBC WITH DIFFERENTIAL/PLATELET
BASOS ABS: 28 {cells}/uL (ref 0–200)
Basophils Relative: 0.4 %
Eosinophils Absolute: 112 cells/uL (ref 15–500)
Eosinophils Relative: 1.6 %
HEMATOCRIT: 48.9 % (ref 38.5–50.0)
Hemoglobin: 16.9 g/dL (ref 13.2–17.1)
LYMPHS ABS: 2541 {cells}/uL (ref 850–3900)
MCH: 30.6 pg (ref 27.0–33.0)
MCHC: 34.6 g/dL (ref 32.0–36.0)
MCV: 88.4 fL (ref 80.0–100.0)
MPV: 10.2 fL (ref 7.5–12.5)
Monocytes Relative: 6.9 %
Neutro Abs: 3836 cells/uL (ref 1500–7800)
Neutrophils Relative %: 54.8 %
PLATELETS: 212 10*3/uL (ref 140–400)
RBC: 5.53 10*6/uL (ref 4.20–5.80)
RDW: 12.9 % (ref 11.0–15.0)
TOTAL LYMPHOCYTE: 36.3 %
WBC: 7 10*3/uL (ref 3.8–10.8)
WBCMIX: 483 {cells}/uL (ref 200–950)

## 2017-12-08 LAB — COMPLETE METABOLIC PANEL WITH GFR
AG Ratio: 1.9 (calc) (ref 1.0–2.5)
ALT: 25 U/L (ref 9–46)
AST: 20 U/L (ref 10–35)
Albumin: 4.6 g/dL (ref 3.6–5.1)
Alkaline phosphatase (APISO): 82 U/L (ref 40–115)
BUN: 18 mg/dL (ref 7–25)
CALCIUM: 10 mg/dL (ref 8.6–10.3)
CO2: 33 mmol/L — ABNORMAL HIGH (ref 20–32)
CREATININE: 1.06 mg/dL (ref 0.70–1.33)
Chloride: 100 mmol/L (ref 98–110)
GFR, EST NON AFRICAN AMERICAN: 80 mL/min/{1.73_m2} (ref >=60)
GFR, Est African American: 92 mL/min/{1.73_m2} (ref >=60)
GLOBULIN: 2.4 g/dL (ref 1.9–3.7)
Glucose, Bld: 77 mg/dL (ref 65–99)
Potassium: 3.7 mmol/L (ref 3.5–5.3)
Sodium: 141 mmol/L (ref 135–146)
Total Bilirubin: 0.6 mg/dL (ref 0.2–1.2)
Total Protein: 7 g/dL (ref 6.1–8.1)

## 2017-12-08 LAB — PSA, TOTAL WITH REFLEX TO PSA, FREE: PSA, TOTAL: 0.3 ng/mL (ref <=4.0)

## 2017-12-08 LAB — HEMOGLOBIN A1C
Hgb A1c MFr Bld: 5.4 % of total Hgb (ref <5.7)
Mean Plasma Glucose: 108 (calc)
eAG (mmol/L): 6 (calc)

## 2017-12-08 LAB — LIPID PANEL
Cholesterol: 161 mg/dL (ref <200)
HDL: 29 mg/dL — ABNORMAL LOW (ref >40)
LDL Cholesterol (Calc): 103 mg/dL (calc) — ABNORMAL HIGH
Non-HDL Cholesterol (Calc): 132 mg/dL (calc) — ABNORMAL HIGH (ref <130)
Total CHOL/HDL Ratio: 5.6 (calc) — ABNORMAL HIGH (ref <5.0)
Triglycerides: 176 mg/dL — ABNORMAL HIGH (ref <150)

## 2017-12-08 LAB — HEPATITIS C ANTIBODY
HEP C AB: NONREACTIVE
SIGNAL TO CUT-OFF: 0.18 (ref <1.00)

## 2017-12-08 LAB — HIV ANTIBODY (ROUTINE TESTING W REFLEX): HIV 1&2 Ab, 4th Generation: NONREACTIVE

## 2017-12-10 ENCOUNTER — Encounter: Payer: Self-pay | Admitting: Family Medicine

## 2017-12-10 ENCOUNTER — Ambulatory Visit (INDEPENDENT_AMBULATORY_CARE_PROVIDER_SITE_OTHER): Payer: Medicaid Other | Admitting: Family Medicine

## 2017-12-10 VITALS — BP 158/90 | HR 86 | Temp 98.1°F | Resp 16 | Ht 72.0 in | Wt 258.0 lb

## 2017-12-10 DIAGNOSIS — Z716 Tobacco abuse counseling: Secondary | ICD-10-CM | POA: Diagnosis not present

## 2017-12-10 DIAGNOSIS — F3340 Major depressive disorder, recurrent, in remission, unspecified: Secondary | ICD-10-CM

## 2017-12-10 DIAGNOSIS — E782 Mixed hyperlipidemia: Secondary | ICD-10-CM

## 2017-12-10 DIAGNOSIS — I10 Essential (primary) hypertension: Secondary | ICD-10-CM

## 2017-12-10 DIAGNOSIS — Z0000 Encounter for general adult medical examination without abnormal findings: Secondary | ICD-10-CM | POA: Diagnosis not present

## 2017-12-10 DIAGNOSIS — J410 Simple chronic bronchitis: Secondary | ICD-10-CM | POA: Diagnosis not present

## 2017-12-10 MED ORDER — ROSUVASTATIN CALCIUM 10 MG PO TABS: 10.0000 mg | ORAL_TABLET | Freq: Every day | ORAL | 5 refills | Status: DC

## 2017-12-10 NOTE — Progress Notes (Signed)
Subjective:    Patient ID: Bennie Hind, male    DOB: 1966/09/18, 52 y.o.   MRN: [id number]  ZAMARIAN SCARANO is a 52 y.o. male presenting on 12/10/2017 for Annual Exam   HPI   Here for Annual Physical and Lab Review  CHRONIC HTN: Reports he checks BP at home occasionally, readings elevated at times. Current Meds - Amlodipine 10mg , Lisinopril 40mg  daily, HCTZ 25mg  daily Reports good compliance, took meds today. Tolerating well, w/o complaints. Lifestyle: - Diet: Poor diet, eating fried fatty foods. - Exercise: Limited  Affective Disorder Followed by Dr Janeece Riggers (Psychiatrist, in [facility]) - was discontinued on Fluoxetine 20mg , then replaced with Skin Patch, to help with depression and help quit smoking, unsure which med he will let us know name of it. Currently doing well, see PHQ score below  Tobacco Abuse Goal to quit smoking now - He uses cigarettes as a reward and crutch for depression and mood, and ready to quit - Will start patch rx from Psychiatry to help quit - Admits some inc congestion from this  HYPERLIPIDEMIA: - Reports no concerns. Last lipid panel 12/2017, abnormal low HDL, mild elevated TG/LDL - Never on cholesterol medication in past  COPD Chronic smoker. Now ready to quit smoking. No recent flare-up.  Additional complaint  Left Back Pain / Scapular / Paresthesia L arm Reports persistent problem Left upper back muscle with a painful "knot" present for a while without improvement, limited trial on other meds was not sure what he could take  Health Maintenance: UTD Colonoscopy UTD Flu, Tdap, vaccines UTD routine HIV / Hep C screen  Depression screen [facility] 2/9 12/10/2017 04/16/2017 12/17/2015  Decreased Interest 2 1 0  Down, Depressed, Hopeless 2 2 0  PHQ - 2 Score 4 3 0  Altered sleeping 2 2 -  Tired, decreased energy 3 3 -  Change in appetite 0 1 -  Feeling bad or failure about yourself  3 3 -  Trouble concentrating 1 2 -  Moving slowly or  fidgety/restless 2 3 -  Suicidal thoughts 0 0 -  PHQ-9 Score 15 17 -  Difficult doing work/chores Not difficult at all - -    Past Medical History:  Diagnosis Date  . Acute cholecystitis 06/29/2017  . 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 Airway hyperreactivity 04/19/2015  . Anxiety   . Anxiety disorder 04/19/2015  . Arthritis 04/19/2015  . At risk for injury 04/19/2015  . CAFL (chronic airflow limitation) (HCC) 04/19/2015  . Cholecystitis 06/29/2017  . Chronic gastritis 04/19/2015  . Compulsive tobacco user syndrome 04/19/2015  . COPD (chronic obstructive pulmonary disease) (HCC)   . Depression   . Encounter for immunization 04/19/2015  . GERD (gastroesophageal reflux disease)   . H/O elevated lipids 04/19/2015  . Hypertension   . Mechanical and motor problems with internal organs 04/19/2015  . Obesity 09/10/2015  . Sleep apnea   . Trigger thumb of left hand 04/15/2017   Past Surgical History:  Procedure Laterality Date  . CHOLECYSTECTOMY N/A 08/06/2017   Procedure: LAPAROSCOPIC CHOLECYSTECTOMY;  Surgeon: Lattie Haw, MD;  Location: [facility] ORS;  Service: General;  Laterality: N/A;  . cyst removal from wrist     Social History   Socioeconomic History  . Marital status: Single    Spouse name: Not on file  . Number of children: Not on file  . Years of education: Not on file  . Highest education level: Not on file  Social Needs  .  Financial resource strain: Not on file  . Food insecurity - worry: Not on file  . Food insecurity - inability: Not on file  . Transportation needs - medical: Not on file  . Transportation needs - non-medical: Not on file  Occupational History  . Not on file  Tobacco Use  . Smoking status: Current Every Day Smoker    Packs/day: 0.25    Types: Cigarettes  . Smokeless tobacco: Never Used  Substance and Sexual Activity  . Alcohol use: No    Alcohol/week: 0.0 oz  . Drug use: No  . Sexual activity: Not on file  Other Topics Concern  . Not on file   Social History Narrative  . Not on file   Family History  Problem Relation Age of Onset  . Stroke Mother 65  . Diabetes Mother   . Kidney disease Mother   . Hypertension Mother   . COPD Maternal Aunt   . Diabetes Maternal Grandmother    Current Outpatient Medications on File Prior to Visit  Medication Sig  . albuterol (PROVENTIL HFA;VENTOLIN HFA) 108 (90 Base) MCG/ACT inhaler Inhale 2 puffs every 4 (four) hours as needed into the lungs for wheezing or shortness of breath.  Marland Kitchen amLODipine (NORVASC) 10 MG tablet Take 10 mg by mouth daily.  Marland Kitchen atomoxetine (STRATTERA) 25 MG capsule Take 25 mg by mouth every evening.  . hydrochlorothiazide (HYDRODIURIL) 25 MG tablet Take 25 mg by mouth daily.  Marland Kitchen lisinopril (PRINIVIL,ZESTRIL) 40 MG tablet Take 40 mg by mouth daily.  . OXYGEN Inhale 2 L into the lungs daily as needed (nightly).  Marland Kitchen buPROPion (WELLBUTRIN XL) 150 MG 24 hr tablet    No current facility-administered medications on file prior to visit.     Review of Systems  Constitutional: Negative for activity change, appetite change, chills, diaphoresis, fatigue and fever.  HENT: Negative for congestion and hearing loss.   Eyes: Negative for visual disturbance.  Respiratory: Negative for apnea, cough, choking, chest tightness, shortness of breath and wheezing.   Cardiovascular: Negative for chest pain, palpitations and leg swelling.  Gastrointestinal: Negative for abdominal pain, anal bleeding, blood in stool, constipation, diarrhea, nausea and vomiting.  Endocrine: Negative for cold intolerance and polyuria.  Genitourinary: Negative for dysuria, frequency and hematuria.  Musculoskeletal: Positive for back pain. Negative for arthralgias and neck pain.  Skin: Negative for rash.  Allergic/Immunologic: Negative for environmental allergies.  Neurological: Negative for dizziness, weakness, light-headedness, numbness and headaches.  Hematological: Negative for adenopathy.    Psychiatric/Behavioral: Negative for behavioral problems, dysphoric mood and sleep disturbance. The patient is not nervous/anxious.    Per HPI unless specifically indicated above     Objective:    BP (!) 158/90 (BP Location: Left Arm, Cuff Size: Normal)   Pulse 86   Temp 98.1 F (36.7 C) (Oral)   Resp 16   Ht 6' (1.829 m)   Wt 258 lb (117 kg)   BMI 34.99 kg/m   Wt Readings from Last 3 Encounters:  12/10/17 258 lb (117 kg)  09/08/17 252 lb (114.3 kg)  08/17/17 244 lb (110.7 kg)    Physical Exam  Constitutional: He is oriented to person, place, and time. He appears well-developed and well-nourished. No distress.  Well-appearing, comfortable, cooperative  HENT:  Head: Normocephalic and atraumatic.  Mouth/Throat: Oropharynx is clear and moist.  Poor dentition  Eyes: Conjunctivae and EOM are normal. Pupils are equal, round, and reactive to light. Right eye exhibits no discharge. Left eye exhibits no discharge.  Neck: Normal range of motion. Neck supple. No thyromegaly present.  Cardiovascular: Normal rate, regular rhythm, normal heart sounds and intact distal pulses.  No murmur heard. Pulmonary/Chest: Effort normal and breath sounds normal. No respiratory distress. He has no wheezes. He has no rales.  Abdominal: Soft. Bowel sounds are normal. He exhibits no distension and no mass. There is no tenderness.  Musculoskeletal: Normal range of motion. He exhibits no edema or tenderness.  Upper / Lower Extremities: - Normal muscle tone, strength bilateral upper extremities 5/5, lower extremities 5/5  Localized tender point with muscle knot left upper back scapular region, possible trigger point  Left Shoulder Inspection: Normal appearance bilateral symmetrical Palpation: Non-tender to palpation over anterior, lateral, or posterior shoulder  ROM: Full intact active ROM forward flexion, abduction, internal / external rotation, symmetrical Special Testing: Rotator cuff testing negative  for weakness with supraspinatus full can and empty can test, Hawkin's AC impingement negative for pain Strength: Normal strength 5/5 flex/ext, ext rot / int rot, grip, rotator cuff str testing. Neurovascular: Distally intact pulses, sensation to light touch  Lymphadenopathy:    He has no cervical adenopathy.  Neurological: He is alert and oriented to person, place, and time.  Distal sensation intact to light touch all extremities  Skin: Skin is warm and dry. No rash noted. He is not diaphoretic. No erythema.  Psychiatric: He has a normal mood and affect. His behavior is normal.  Well groomed, good eye contact, normal speech and thoughts  Nursing note and vitals reviewed.  Results for orders placed or performed in visit on 12/07/17  Hepatitis C antibody  Result Value Ref Range   Hepatitis C Ab NON-REACTIVE NON-REACTI   SIGNAL TO CUT-OFF 0.18 <1.00  PSA, Total with Reflex to PSA, Free  Result Value Ref Range   PSA, Total 0.3 < OR = 4.0 ng/mL  CBC with Differential/Platelet  Result Value Ref Range   WBC 7.0 3.8 - 10.8 Thousand/uL   RBC 5.53 4.20 - 5.80 Million/uL   Hemoglobin 16.9 13.2 - 17.1 g/dL   HCT 16.1 09.6 - 04.5 %   MCV 88.4 80.0 - 100.0 fL   MCH 30.6 27.0 - 33.0 pg   MCHC 34.6 32.0 - 36.0 g/dL   RDW 40.9 81.1 - 91.4 %   Platelets 212 140 - 400 Thousand/uL   MPV 10.2 7.5 - 12.5 fL   Neutro Abs 3,836 1,500 - 7,800 cells/uL   Lymphs Abs 2,541 850 - 3,900 cells/uL   WBC mixed population 483 200 - 950 cells/uL   Eosinophils Absolute 112 15 - 500 cells/uL   Basophils Absolute 28 0 - 200 cells/uL   Neutrophils Relative % 54.8 %   Total Lymphocyte 36.3 %   Monocytes Relative 6.9 %   Eosinophils Relative 1.6 %   Basophils Relative 0.4 %  Hemoglobin A1c  Result Value Ref Range   Hgb A1c MFr Bld 5.4 <5.7 % of total Hgb   Mean Plasma Glucose 108 (calc)   eAG (mmol/L) 6.0 (calc)  Lipid panel  Result Value Ref Range   Cholesterol 161 <200 mg/dL   HDL 29 (L) >78 mg/dL    Triglycerides 295 (H) <150 mg/dL   LDL Cholesterol (Calc) 103 (H) mg/dL (calc)   Total CHOL/HDL Ratio 5.6 (H) <5.0 (calc)   Non-HDL Cholesterol (Calc) 132 (H) <130 mg/dL (calc)  COMPLETE METABOLIC PANEL WITH GFR  Result Value Ref Range   Glucose, Bld 77 65 - 99 mg/dL   BUN 18 7 -  25 mg/dL   Creat 1.611.06 0.960.70 - 0.451.33 mg/dL   GFR, Est Non African American 80 > OR = 60 mL/min/1.5273m2   GFR, Est African American 92 > OR = 60 mL/min/1.6973m2   BUN/Creatinine Ratio NOT APPLICABLE 6 - 22 (calc)   Sodium 141 135 - 146 mmol/L   Potassium 3.7 3.5 - 5.3 mmol/L   Chloride 100 98 - 110 mmol/L   CO2 33 (H) 20 - 32 mmol/L   Calcium 10.0 8.6 - 10.3 mg/dL   Total Protein 7.0 6.1 - 8.1 g/dL   Albumin 4.6 3.6 - 5.1 g/dL   Globulin 2.4 1.9 - 3.7 g/dL (calc)   AG Ratio 1.9 1.0 - 2.5 (calc)   Total Bilirubin 0.6 0.2 - 1.2 mg/dL   Alkaline phosphatase (APISO) 82 40 - 115 U/L   AST 20 10 - 35 U/L   ALT 25 9 - 46 U/L  HIV antibody  Result Value Ref Range   HIV 1&2 Ab, 4th Generation NON-REACTIVE NON-REACTI      Assessment & Plan:   Problem List Items Addressed This Visit    Affective disorder, major    Stable currently Follow[facility], [facility] Requesting record      Relevant Medications   buPROPion (WELLBUTRIN XL) 150 MG 24 hr tablet   COPD (chronic obstructive pulmonary disease) (HCC)    Stable COPD w/o exacerbation Quit smoking      Essential hypertension    Still uncontrolled HTN despite on meds now, better adherence by report, some pain in back and smoking still - Home BP readings none for comparison available No known complications   Plan:  1. Discussed additional blood pressure meds and agree hold since quitting smoking now - Continue Amlodipine 10mg  daily, HCTZ 25mg  daily, Lisinopril 40mg  2. Encourage improved lifestyle - low sodium diet, regular exercise 3. Continue monitor BP outside office at pharmacy, bring readings to next visit, if persistently  >140/90 or new symptoms notify office sooner 4. Follow-up 3 months BP      Relevant Medications   rosuvastatin (CRESTOR) 10 MG tablet   Hyperlipidemia    Mildly low HDL, high LDL TG - poor lifestyle Last lipid panel 12/2017 Calculated ASCVD 10 yr risk score elevated 18.5% as smoker (down to 10% if quit smoking)  Plan: 1. Discussion on ASCVD risk - offered statin - agree trial on Rosuvastatin 10mg  nightly start 1x weekly titrate up as tolerated maybe QOD or daily if able 2. Consider future ASA 81mg  for primary ASCVD risk reduction 3. Encourage improved lifestyle - low carb/cholesterol, reduce portion size, start regular exercise 4. Follow-up lipids in approx 6-12 mo      Relevant Medications   rosuvastatin (CRESTOR) 10 MG tablet    Other Visit Diagnoses    Annual physical exam    -  Primary Reviewed HM updated Encourage improve lifestyle diet / exercise wt loss Quit smoking - handout given, continue medicine per Psych    Tobacco abuse counseling          #Left scapular muscle pain Possible trigger point Trial on NSAID OTC - then may return within few weeks for trigger point injection if not improved  Meds ordered this encounter  Medications  . rosuvastatin (CRESTOR) 10 MG tablet    Sig: Take 1 tablet (10 mg total) by mouth at bedtime. May start with lower dose 1x weekly and increase gradually    Dispense:  30 tablet    Refill:  5  Follow up plan: Return in about 3 months (around 03/09/2018) for HTN, Smoking, Cholesterol med.  Saralyn Pilar, DO [facility] 12/10/2017, 3:08 PM

## 2017-12-10 NOTE — Assessment & Plan Note (Signed)
Still uncontrolled HTN despite on meds now, better adherence by report, some pain in back and smoking still - Home BP readings none for comparison available No known complications   Plan:  1. Discussed additional blood pressure meds and agree hold since quitting smoking now - Continue Amlodipine 10mg  daily, HCTZ 25mg  daily, Lisinopril 40mg  2. Encourage improved lifestyle - low sodium diet, regular exercise 3. Continue monitor BP outside office at pharmacy, bring readings to next visit, if persistently >140/90 or new symptoms notify office sooner 4. Follow-up 3 months BP

## 2017-12-10 NOTE — Assessment & Plan Note (Signed)
Mildly low HDL, high LDL TG - poor lifestyle Last lipid panel 12/2017 Calculated ASCVD 10 yr risk score elevated 18.5% as smoker (down to 10% if quit smoking)  Plan: 1. Discussion on ASCVD risk - offered statin - agree trial on Rosuvastatin 10mg  nightly start 1x weekly titrate up as tolerated maybe QOD or daily if able 2. Consider future ASA 81mg  for primary ASCVD risk reduction 3. Encourage improved lifestyle - low carb/cholesterol, reduce portion size, start regular exercise 4. Follow-up lipids in approx 6-12 mo

## 2017-12-10 NOTE — Assessment & Plan Note (Signed)
Stable currently Followed by Dr Janeece RiggersSu [facility]) Requesting record

## 2017-12-10 NOTE — Assessment & Plan Note (Addendum)
Stable COPD w/o exacerbation Quit smoking

## 2017-12-10 NOTE — Patient Instructions (Addendum)
Thank you for coming to the office today.  1. Good luck with quitting smoking with patch - let me know if need other help  QUITLINE for smoking 1 [id number] NOW  A1c 5.4, no problem with blood sugar  Elevated cardiovascular risk score due to smoking, and cholesterol BP  Start Rosuvastatin 10mg  at bedtime for cholesterol and risk - start with once a week then gradually increase to 2-3 times or every other day, then if tolerating well take EVERY DAY  If joint aches and pains worse - then can stop or reduce dose  For L shoulder  Recommend trial of Anti-inflammatory with OTC Aleve Naproxen 250mg  tabs - take ONE OR TWO with food and plenty of water TWICE daily every day (breakfast and dinner), for next 1 to 2 weeks, then you may take only as needed - DO NOT TAKE any ibuprofen, aleve, motrin while you are taking this medicine - It is safe to take Tylenol Ext Str 500mg  tabs - take 1 to 2 (max dose 1000mg ) every 6 hours as needed for breakthrough pain, max 24 hour daily dose is 6 to 8 tablets or 4000mg   If not improving L shoulder we can follow-up and discuss cortisone injection or trigger point injection in Left shoulder  Can consider chiropractor as well  - call us with name of patch from Dr Janeece RiggersSu  Please schedule a Follow-up Appointment to: Return in about 3 months (around 03/09/2018) for HTN, Smoking, Cholesterol med.    If you have any other questions or concerns, please feel free to call the office or send a message through MyChart. You may also schedule an earlier appointment if necessary.  Additionally, you may be receiving a survey about your experience at our office within a few days to 1 week by e-mail or mail. We value your feedback.  Saralyn PilarAlexander Aniello Christopoulos, DO [facility], [place]

## 2017-12-19 DIAGNOSIS — J449 Chronic obstructive pulmonary disease, unspecified: Secondary | ICD-10-CM | POA: Diagnosis not present

## 2017-12-19 DIAGNOSIS — I720 Aneurysm of carotid artery: Secondary | ICD-10-CM | POA: Diagnosis not present

## 2018-01-16 DIAGNOSIS — I720 Aneurysm of carotid artery: Secondary | ICD-10-CM | POA: Diagnosis not present

## 2018-01-16 DIAGNOSIS — J449 Chronic obstructive pulmonary disease, unspecified: Secondary | ICD-10-CM | POA: Diagnosis not present

## 2018-01-22 ENCOUNTER — Encounter: Payer: Medicaid Other | Admitting: Family Medicine

## 2018-02-16 DIAGNOSIS — I720 Aneurysm of carotid artery: Secondary | ICD-10-CM | POA: Diagnosis not present

## 2018-02-16 DIAGNOSIS — J449 Chronic obstructive pulmonary disease, unspecified: Secondary | ICD-10-CM | POA: Diagnosis not present

## 2018-03-09 ENCOUNTER — Ambulatory Visit: Payer: Medicaid Other | Admitting: Family Medicine

## 2018-03-09 ENCOUNTER — Encounter: Payer: Self-pay | Admitting: Family Medicine

## 2018-03-09 ENCOUNTER — Ambulatory Visit (INDEPENDENT_AMBULATORY_CARE_PROVIDER_SITE_OTHER): Payer: Medicare Other | Admitting: Family Medicine

## 2018-03-09 VITALS — BP 170/98 | HR 80 | Temp 98.6°F | Resp 16 | Ht 72.0 in | Wt 268.0 lb

## 2018-03-09 DIAGNOSIS — I10 Essential (primary) hypertension: Secondary | ICD-10-CM

## 2018-03-09 DIAGNOSIS — E782 Mixed hyperlipidemia: Secondary | ICD-10-CM | POA: Diagnosis not present

## 2018-03-09 DIAGNOSIS — F3340 Major depressive disorder, recurrent, in remission, unspecified: Secondary | ICD-10-CM

## 2018-03-09 MED ORDER — LISINOPRIL-HYDROCHLOROTHIAZIDE 20-25 MG PO TABS: 1.0000 | ORAL_TABLET | Freq: Every day | ORAL | 5 refills | Status: DC

## 2018-03-09 NOTE — Assessment & Plan Note (Signed)
See last A&P Brief update, he has never started Statin (rosuvastatin) from 12/2017, decided not to take, non adherent to recommendation He will try to improve diet, lifestyle, wt loss instead Follow-up yearly lipid

## 2018-03-09 NOTE — Assessment & Plan Note (Signed)
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

## 2018-03-09 NOTE — Patient Instructions (Addendum)
Thank you for coming to the office today.  Medication changes  BP is significantly elevated today, off medications  STOP taking Lisinopril and HCTZ Hydrochlorothiazide - NOW these are combined into one pill  START taking combo-pill Lisinopril-HCTZ 20-25 - take one daily in morning  CONTINUE taking Amlodipine  daily  Keep working on improving plan to quit smoking  Keep checking BP and write down readings, notify office if BP readings >150/90 consistently can notify us and we make changes  Please schedule a Follow-up Appointment to: Return in about 3 months (around 06/09/2018) for HTN med adjust.  If you have any other questions or concerns, please feel free to call the office or send a message through MyChart. You may also schedule an earlier appointment if necessary.  Additionally, you may be receiving a survey about your experience at our office within a few days to 1 week by e-mail or mail. We value your feedback.  Saralyn Pilar, DO [facility], [place]

## 2018-03-09 NOTE — Assessment & Plan Note (Signed)
Uncontrolled HTN as now it seems patient remains non adherent to medications (prior history of same problem) Active smoker, stress among other factors, poor diet and lifestyle - Home BP readings none for comparison available - recently, but has cuff No known complications    Plan:  1. Discussed additional blood pressure meds and agree hold since quitting smoking now - and also since he is not taking current 3 medications - explained the potential harm of not telling us accurate medicines he is taking - appreciate his honesty today and advised need to resume medications to control BP, and avoid long-term inc ASCVD risk among other complications - Patient prefer fewest # of pills - checked [facility] $4 list and only available option is combo of 2 his pills, the triple combo pill does not seem covered and would be too costly - START new Lisinopril-HCTZ 20-25mg  one daily - STOP Lisinopril  and HCTZ  separate rx - CONTINUE Amlodipine  daily 2. Encourage improved lifestyle - low sodium diet, regular exercise 3. Continue monitor BP outside office at pharmacy, bring readings to next visit, if persistently >140/90 or new symptoms notify office sooner 4. Follow-up 3 months BP  Reviewed my concerns with patient about med non adherence - since it is not an issue of cost on these affordable meds, he needs to start taking meds, ultimately if he cannot adhere to treatment and remains uncontrolled BP if not ready to follow medical advice, then we may need to discharge him in future

## 2018-03-09 NOTE — Progress Notes (Signed)
Subjective:    Patient ID: Eric Lawrence, male    DOB: Nov 29, 1965, 52 y.o.   MRN: [id number]  Eric Lawrence is a 52 y.o. male presenting on 03/09/2018 for Hyperlipidemia   HPI   Specialists: Psychiatry - Dr Stevphen [facility], [facility])  CHRONIC HTN: Reports that he has stopped taking BP medications, difficulty in giving reason for stopping, primarily he does not like taking medicines, hard to keep track of 3 different pills. He has checked BP but infrequently now, not recently. - he states cost is an issue, [facility] list and has discount his rx are cheap and affordable but he is asking on a combination pill one pill for all 3 meds Current Meds -Not taking any of these 3 currently - Amlodipine , Lisinopril  daily, HCTZ  daily Reports poor compliance, did not take meds today. Previously tolerating well, w/o complaints. Lifestyle: - Diet: Limited change, still poor diet, eating fried fatty foods. - Exercise: Limited, some walking  Affective Disorder Followed by Psychiatry, Dr Janeece Riggers. Next apt in about 3 months. He has been taken off Strattera in past, and also Fluoxetine as of few months ago. Continues on Wellbutrin and Nicotine patches. He was advised to resume therapy / counseling again through [facility] - will go to Dr Fredric Mare previous therapist See PHQ, admits persistent thought of not wanting to be "around or alive" but he does not intend or want or have a plan to harm himself or others, see below, passive ideation, he discusses with therapist  Tobacco Abuse / COPD He continues to smoke, but has plan to keep tapering down on current regimen. He was able to quit for 5 days but then resume. He is trying to quit with his roommate, as well  HYPERLIPIDEMIA: - Reports no concerns. Last lipid panel 12/2017, abnormal low HDL, mild elevated TG/LDL - Never on cholesterol medication in past - he was rx Rosuvastatin at last visit 12/2017 he never picked up rx  in past 3 months, and is no longer interested in taking this med   Depression screen [facility] 2/9 03/09/2018 12/10/2017 04/16/2017  Decreased Interest Down, Depressed, Hopeless PHQ - 2 Score Altered sleeping Tired, decreased energy Change in appetite 2 0 1  Feeling bad or failure about yourself  Trouble concentrating Moving slowly or fidgety/restless Suicidal thoughts 3 0 0  PHQ-9 Score Difficult doing work/chores Somewhat difficult Not difficult at all -   [place]-Suicide Severity Rating Scale 1) Have you wished you were dead or wished you could go to sleep and not wake up? - Yes  2) Have you had any actual thoughts of killing yourself? - No  Skip questions 3,4, 5  6) Have you ever done anything, started to do anything, or prepared to do anything to end your life? - No   Social History   Tobacco Use  . Smoking status: Current Every Day Smoker    Packs/day: 0.25    Types: Cigarettes  . Smokeless tobacco: Never Used  Substance Use Topics  . Alcohol use: No    Alcohol/week: 0.0 oz  . Drug use: No    Review of Systems Per HPI unless specifically indicated above     Objective:    BP (!) 170/98 (BP Location: Left Arm,  Cuff Size: Normal)   Pulse 80   Temp 98.6 F (37 C) (Oral)   Resp 16   Ht 6' (1.829 m)   Wt 268 lb (121.6 kg)   BMI 36.35 kg/m   Wt Readings from Last 3 Encounters:  03/09/18 268 lb (121.6 kg)  12/10/17 258 lb (117 kg)  09/08/17 252 lb (114.3 kg)    Physical Exam  Constitutional: He is oriented to person, place, and time. He appears well-developed and well-nourished. No distress.  Well-appearing currently, comfortable, cooperative, obese  HENT:  Head: Normocephalic and atraumatic.  Mouth/Throat: Oropharynx is clear and moist.  Poor dentition  Eyes: Conjunctivae are normal. Right eye exhibits no discharge. Left eye exhibits no discharge.  Neck: Normal range of motion. Neck supple.    Cardiovascular: Normal rate, regular rhythm, normal heart sounds and intact distal pulses.  No murmur heard. Pulmonary/Chest: Breath sounds normal. No respiratory distress. He has no wheezes. He has no rales.  Mild reduced air movement occasional cough.  Musculoskeletal: Normal range of motion. He exhibits no edema.  Neurological: He is alert and oriented to person, place, and time.  Skin: Skin is warm and dry. No rash noted. He is not diaphoretic. No erythema.  Psychiatric: He has a normal mood and affect. His behavior is normal.  Well groomed, good eye contact, normal speech and thoughts  Nursing note and vitals reviewed.  Results for orders placed or performed in visit on 12/07/17  Hepatitis C antibody  Result Value Ref Range   Hepatitis C Ab NON-REACTIVE NON-REACTI   SIGNAL TO CUT-OFF 0.18 <1.00  PSA, Total with Reflex to PSA, Free  Result Value Ref Range   PSA, Total 0.3 < OR = 4.0 ng/mL  CBC with Differential/Platelet  Result Value Ref Range   WBC 7.0 3.8 - 10.8 Thousand/uL   RBC 5.53 4.20 - 5.80 Million/uL   Hemoglobin 16.9 13.2 - 17.1 g/dL   HCT 16.1 09.6 - 04.5 %   MCV 88.4 80.0 - 100.0 fL   MCH 30.6 27.0 - 33.0 pg   MCHC 34.6 32.0 - 36.0 g/dL   RDW 40.9 81.1 - 91.4 %   Platelets 212 140 - 400 Thousand/uL   MPV 10.2 7.5 - 12.5 fL   Neutro Abs 3,836 1,500 - 7,800 cells/uL   Lymphs Abs 2,541 850 - 3,900 cells/uL   WBC mixed population 483 200 - 950 cells/uL   Eosinophils Absolute 112 15 - 500 cells/uL   Basophils Absolute 28 0 - 200 cells/uL   Neutrophils Relative % 54.8 %   Total Lymphocyte 36.3 %   Monocytes Relative 6.9 %   Eosinophils Relative 1.6 %   Basophils Relative 0.4 %  Hemoglobin A1c  Result Value Ref Range   Hgb A1c MFr Bld 5.4 <5.7 % of total Hgb   Mean Plasma Glucose 108 (calc)   eAG (mmol/L) 6.0 (calc)  Lipid panel  Result Value Ref Range   Cholesterol 161 <200 mg/dL   HDL 29 (L) >78 mg/dL   Triglycerides 295 (H) <150 mg/dL   LDL Cholesterol  (Calc) 103 (H) mg/dL (calc)   Total CHOL/HDL Ratio 5.6 (H) <5.0 (calc)   Non-HDL Cholesterol (Calc) 132 (H) <130 mg/dL (calc)  COMPLETE METABOLIC PANEL WITH GFR  Result Value Ref Range   Glucose, Bld 77 65 - 99 mg/dL   BUN 18 7 - 25 mg/dL   Creat 6.21 3.08 - 6.57 mg/dL   GFR, Est Non African American 80 > OR = 60  mL/min/1.61m2   GFR, Est African American 92 > OR = 60 mL/min/1.53m2   BUN/Creatinine Ratio NOT APPLICABLE 6 - 22 (calc)   Sodium 141 135 - 146 mmol/L   Potassium 3.7 3.5 - 5.3 mmol/L   Chloride 100 98 - 110 mmol/L   CO2 33 (H) 20 - 32 mmol/L   Calcium 10.0 8.6 - 10.3 mg/dL   Total Protein 7.0 6.1 - 8.1 g/dL   Albumin 4.6 3.6 - 5.1 g/dL   Globulin 2.4 1.9 - 3.7 g/dL (calc)   AG Ratio 1.9 1.0 - 2.5 (calc)   Total Bilirubin 0.6 0.2 - 1.2 mg/dL   Alkaline phosphatase (APISO) 82 40 - 115 U/L   AST 20 10 - 35 U/L   ALT 25 9 - 46 U/L  HIV antibody  Result Value Ref Range   HIV 1&2 Ab, 4th Generation NON-REACTIVE NON-REACTI      Assessment & Plan:   Problem List Items Addressed This Visit    Affective disorder, major    Stable currently - but has chronic passive suicidal ideation without active thoughts or plan Followed by Dr Janeece Riggers [facility], [facility]) Now resume therapist/counselor locally Dr Fredric Mare Continue current meds and therapy per Psych      Essential hypertension - Primary    Uncontrolled HTN as now it seems patient remains non adherent to medications (prior history of same problem) Active smoker, stress among other factors, poor diet and lifestyle - Home BP readings none for comparison available - recently, but has cuff No known complications    Plan:  1. Discussed additional blood pressure meds and agree hold since quitting smoking now - and also since he is not taking current 3 medications - explained the potential harm of not telling us accurate medicines he is taking - appreciate his honesty today and advised need to resume medications to  control BP, and avoid long-term inc ASCVD risk among other complications - Patient prefer fewest # of pills - checked [facility] $4 list and only available option is combo of 2 his pills, the triple combo pill does not seem covered and would be too costly - START new Lisinopril-HCTZ 20-25mg  one daily - STOP Lisinopril  and HCTZ  separate rx - CONTINUE Amlodipine  daily 2. Encourage improved lifestyle - low sodium diet, regular exercise 3. Continue monitor BP outside office at pharmacy, bring readings to next visit, if persistently >140/90 or new symptoms notify office sooner 4. Follow-up 3 months BP  Reviewed my concerns with patient about med non adherence - since it is not an issue of cost on these affordable meds, he needs to start taking meds, ultimately if he cannot adhere to treatment and remains uncontrolled BP if not ready to follow medical advice, then we may need to discharge him in future      Relevant Medications   lisinopril-hydrochlorothiazide (PRINZIDE,ZESTORETIC) 20-25 MG tablet   Hyperlipidemia    See last A&P Brief update, he has never started Statin (rosuvastatin) from 12/2017, decided not to take, non adherent to recommendation He will try to improve diet, lifestyle, wt loss instead Follow-up yearly lipid      Relevant Medications   lisinopril-hydrochlorothiazide (PRINZIDE,ZESTORETIC) 20-25 MG tablet      Meds ordered this encounter  Medications  . lisinopril-hydrochlorothiazide (PRINZIDE,ZESTORETIC) 20-25 MG tablet    Sig: Take 1 tablet by mouth daily.    Dispense:  30 tablet    Refill:  5    Dose change, now combo pill  Follow up plan: Return in about 3 months (around 06/09/2018) for HTN med adjust.  Saralyn Pilar, DO [facility] 03/09/2018, 6:16 PM

## 2018-03-15 ENCOUNTER — Telehealth: Payer: Self-pay | Admitting: Family Medicine

## 2018-03-15 DIAGNOSIS — E782 Mixed hyperlipidemia: Secondary | ICD-10-CM

## 2018-03-15 NOTE — Telephone Encounter (Signed)
Pharmacist from [facility]) called our office to get appropriate med rec, all meds transferred from [facility] pharmacy. Patient now to go to [facility] pharmacy for all meds.  Confirmed he should be taking following meds rx from me: - Lisinopril-HCTZ 20-25mg  daily - Amlodipine  daily - Rosuvastatin  nightly  He is OFF Metoprolol  Saralyn Pilar, DO [facility] 03/15/2018, 1:10 PM

## 2018-03-18 DIAGNOSIS — I720 Aneurysm of carotid artery: Secondary | ICD-10-CM | POA: Diagnosis not present

## 2018-03-18 DIAGNOSIS — J449 Chronic obstructive pulmonary disease, unspecified: Secondary | ICD-10-CM | POA: Diagnosis not present

## 2018-04-18 DIAGNOSIS — J449 Chronic obstructive pulmonary disease, unspecified: Secondary | ICD-10-CM | POA: Diagnosis not present

## 2018-04-18 DIAGNOSIS — I720 Aneurysm of carotid artery: Secondary | ICD-10-CM | POA: Diagnosis not present

## 2018-05-18 DIAGNOSIS — I720 Aneurysm of carotid artery: Secondary | ICD-10-CM | POA: Diagnosis not present

## 2018-05-18 DIAGNOSIS — J449 Chronic obstructive pulmonary disease, unspecified: Secondary | ICD-10-CM | POA: Diagnosis not present

## 2018-06-11 ENCOUNTER — Other Ambulatory Visit: Payer: Self-pay | Admitting: Family Medicine

## 2018-06-11 NOTE — Telephone Encounter (Signed)
Patient need appointment before further refill.

## 2018-06-18 DIAGNOSIS — I720 Aneurysm of carotid artery: Secondary | ICD-10-CM | POA: Diagnosis not present

## 2018-06-18 DIAGNOSIS — J449 Chronic obstructive pulmonary disease, unspecified: Secondary | ICD-10-CM | POA: Diagnosis not present

## 2018-06-28 ENCOUNTER — Other Ambulatory Visit: Payer: Self-pay | Admitting: Family Medicine

## 2018-06-28 DIAGNOSIS — E782 Mixed hyperlipidemia: Secondary | ICD-10-CM

## 2018-07-01 ENCOUNTER — Other Ambulatory Visit: Payer: Self-pay

## 2018-07-01 DIAGNOSIS — E782 Mixed hyperlipidemia: Secondary | ICD-10-CM

## 2018-07-05 ENCOUNTER — Encounter: Payer: Self-pay | Admitting: Emergency Medicine

## 2018-07-05 ENCOUNTER — Other Ambulatory Visit: Payer: Self-pay

## 2018-07-05 ENCOUNTER — Ambulatory Visit
Admission: EM | Admit: 2018-07-05 | Discharge: 2018-07-05 | Disposition: A | Discharge status: Home / Self Care | Payer: Medicare Other | Attending: Family Medicine | Admitting: Family Medicine

## 2018-07-05 DIAGNOSIS — S60562A Insect bite (nonvenomous) of left hand, initial encounter: Secondary | ICD-10-CM

## 2018-07-05 DIAGNOSIS — W57XXXA Bitten or stung by nonvenomous insect and other nonvenomous arthropods, initial encounter: Secondary | ICD-10-CM

## 2018-07-05 MED ORDER — SULFAMETHOXAZOLE-TRIMETHOPRIM 800-160 MG PO TABS: 1.0000 | ORAL_TABLET | Freq: Two times a day (BID) | ORAL | 0 refills | Status: AC

## 2018-07-05 MED ORDER — SULFAMETHOXAZOLE-TRIMETHOPRIM 800-160 MG PO TABS: 1.0000 | ORAL_TABLET | Freq: Two times a day (BID) | ORAL | 0 refills | Status: DC

## 2018-07-05 MED ORDER — MUPIROCIN 2 % EX OINT: 1.0000 "application " | TOPICAL_OINTMENT | Freq: Two times a day (BID) | CUTANEOUS | 0 refills | Status: DC

## 2018-07-05 MED ORDER — MUPIROCIN 2 % EX OINT: 1.0000 "application " | TOPICAL_OINTMENT | Freq: Two times a day (BID) | CUTANEOUS | 0 refills | Status: AC

## 2018-07-05 NOTE — Discharge Instructions (Signed)
Medications as directed.  If you fail to improve or worsen, please let us know  Take care  Dr. Adriana Simas

## 2018-07-05 NOTE — ED Triage Notes (Signed)
Patient c/o itchy rash to right hand yesterday. He was working in the yard yesterday. He stated the areas have blistered and opened up.

## 2018-07-05 NOTE — ED Provider Notes (Signed)
[facility]    CSN: [id number] Arrival date & time: 07/05/18  1659  History   Chief Complaint Chief Complaint  Patient presents with  . Rash   HPI  52 year old male presents with the above complaint.  Patient states that he was working in the flower bed yesterday.  After he did so, he noticed 3 areas on his L hand/thumb that were raised, slightly itchy, and red.  He reports ongoing drainage currently.  He is applied some over-the-counter topicals without improvement.  No reports of contact with poison oak or poison ivy.  No fevers or chills.  No other associated symptoms.  No other complaints.  PMH, Surgical Hx, Family Hx, Social History reviewed and updated as below.  Past Medical History:  Diagnosis Date  . Acute cholecystitis 06/29/2017  . 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 Airway hyperreactivity 04/19/2015  . Anxiety   . Anxiety disorder 04/19/2015  . Arthritis 04/19/2015  . At risk for injury 04/19/2015  . CAFL (chronic airflow limitation) (HCC) 04/19/2015  . Cholecystitis 06/29/2017  . Chronic gastritis 04/19/2015  . Compulsive tobacco user syndrome 04/19/2015  . COPD (chronic obstructive pulmonary disease) (HCC)   . Depression   . Encounter for immunization 04/19/2015  . GERD (gastroesophageal reflux disease)   . H/O elevated lipids 04/19/2015  . Hypertension   . Mechanical and motor problems with internal organs 04/19/2015  . Obesity 09/10/2015  . Sleep apnea   . Trigger thumb of left hand 04/15/2017    Patient Active Problem List   Diagnosis Date Noted  . Abnormal glucose 09/08/2017  . Biliary colic   . S/P cholecystectomy 06/29/2017  . History of cholecystitis 06/29/2017  . Trigger thumb of left hand 04/15/2017  . Obesity 09/10/2015  . Anxiety disorder 04/19/2015  . Arthritis 04/19/2015  . Airway hyperreactivity 04/19/2015  . COPD (chronic obstructive pulmonary disease) (HCC) 04/19/2015  . 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 Hyperlipidemia  04/19/2015  . Essential hypertension 04/19/2015  . Compulsive tobacco user syndrome 04/19/2015  . Chronic gastritis 04/19/2015    Past Surgical History:  Procedure Laterality Date  . CHOLECYSTECTOMY N/A 08/06/2017   Procedure: LAPAROSCOPIC CHOLECYSTECTOMY;  Surgeon: Lattie Haw, MD;  Location: [facility] ORS;  Service: General;  Laterality: N/A;  . cyst removal from wrist         Home Medications    Prior to Admission medications   Medication Sig Start Date End Date Taking? Authorizing Provider  albuterol (PROVENTIL HFA;VENTOLIN HFA) 108 (90 Base) MCG/ACT inhaler Inhale 2 puffs every 4 (four) hours as needed into the lungs for wheezing or shortness of breath. 09/08/17  Yes Karamalegos, Netta Neat, DO  amLODipine (NORVASC) 10 MG tablet TAKE ONE TABLET BY MOUTH EVERY DAY 06/11/18  Yes Althea Charon, Netta Neat, DO  buPROPion (WELLBUTRIN XL) 150 MG 24 hr tablet  12/09/17  Yes [provider]  lisinopril-hydrochlorothiazide (PRINZIDE,ZESTORETIC) 20-25 MG tablet Take 1 tablet by mouth daily. 03/09/18  Yes Karamalegos, Netta Neat, DO  nicotine (NICODERM CQ - DOSED IN MG/24 HOURS) 21 mg/24hr patch APPLY TO SKIN ONCE A DAY, REMOVE BEFORE BEDTIME, USE ALTERNATE LOCATIONS 02/25/18  Yes [provider]  OXYGEN Inhale 2 L into the lungs daily as needed (nightly).   Yes [provider]  rosuvastatin (CRESTOR) 10 MG tablet Take 1 tablet (10 mg total) by mouth daily. 03/15/18  Yes Karamalegos, Netta Neat, DO  mupirocin ointment (BACTROBAN) 2 % Apply 1 application topically 2 (two) times daily for 7 days. 07/05/18  07/12/18  Tommie Sams, DO  sulfamethoxazole-trimethoprim (BACTRIM DS,SEPTRA DS) 800-160 MG tablet Take 1 tablet by mouth 2 (two) times daily for 7 days. 07/05/18 07/12/18  Tommie Sams, DO    Family History Family History  Problem Relation Age of Onset  . Stroke Mother 8  . Diabetes Mother   . Kidney disease Mother   . Hypertension Mother   . COPD Maternal Aunt   .  Diabetes Maternal Grandmother     Social History Social History   Tobacco Use  . Smoking status: Current Every Day Smoker    Packs/day: 0.25    Types: Cigarettes  . Smokeless tobacco: Never Used  Substance Use Topics  . Alcohol use: No    Alcohol/week: 0.0 standard drinks  . Drug use: No     Allergies   Patient has no known allergies.   Review of Systems Review of Systems  Constitutional: Negative.   Skin:       Patient has some open areas which are draining and are slightly red.   Physical Exam Triage Vital Signs ED Triage Vitals  Enc Vitals Group     BP 07/05/18 1718 (!) 182/103     Pulse Rate 07/05/18 1718 85     Resp 07/05/18 1718 18     Temp 07/05/18 1718 98.1 F (36.7 C)     Temp Source 07/05/18 1718 Oral     SpO2 07/05/18 1718 98 %     Weight 07/05/18 1715 258 lb (117 kg)     Height 07/05/18 1715 6\' 2"  (1.88 m)     Head Circumference --      Peak Flow --      Pain Score 07/05/18 1715 0     Pain Loc --      Pain Edu? --      Excl. in GC? --    Updated Vital Signs BP (!) 182/103 (BP Location: Right Arm)   Pulse 85   Temp 98.1 F (36.7 C) (Oral)   Resp 18   Ht 6\' 2"  (1.88 m)   Wt 117 kg   SpO2 98%   BMI 33.13 kg/m   Visual Acuity Right Eye Distance:   Left Eye Distance:   Bilateral Distance:    Right Eye Near:   Left Eye Near:    Bilateral Near:     Physical Exam  Constitutional: He is oriented to person, place, and time. He appears well-developed. No distress.  Cardiovascular: Normal rate and regular rhythm.  Pulmonary/Chest: Effort normal. No respiratory distress.  Neurological: He is alert and oriented to person, place, and time.  Skin:  Left thumb with 2 open areas with mild spreading erythema.  Mild serous drainage.  Patient also has a similar lesion on the dorsum of the wrist.  Psychiatric: He has a normal mood and affect. His behavior is normal.  Nursing note and vitals reviewed.  UC Treatments / Results  Labs (all labs  ordered are listed, but only abnormal results are displayed) Labs Reviewed - No data to display  EKG None  Radiology No results found.  Procedures Procedures (including critical care time)  Medications Ordered in UC Medications - No data to display  Initial Impression / Assessment and Plan / UC Course  I have reviewed the triage vital signs and the nursing notes.  Pertinent labs & imaging results that were available during my care of the patient were reviewed by me and considered in my medical decision making (see chart  for details).    52 year old male presents with suspected bug bites.  Concern for developing underlying infection.  Does not appear to be secondary to contact dermatitis or poison oak/poison ivy.  Placing on Bactrim and mupirocin.  Final Clinical Impressions(s) / UC Diagnoses   Final diagnoses:  Insect bite of left hand, initial encounter     Discharge Instructions     Medications as directed.  If you fail to improve or worsen, please let us know  Take care  Dr. Adriana Simas    ED Prescriptions    Medication Sig Dispense Auth. Provider   mupirocin ointment (BACTROBAN) 2 %  (Status: Discontinued) Apply 1 application topically 2 (two) times daily for 7 days. 22 g Aleisha Paone G, DO   sulfamethoxazole-trimethoprim (BACTRIM DS,SEPTRA DS) 800-160 MG tablet  (Status: Discontinued) Take 1 tablet by mouth 2 (two) times daily for 7 days. 14 tablet Yudith Norlander G, DO   mupirocin ointment (BACTROBAN) 2 % Apply 1 application topically 2 (two) times daily for 7 days. 22 g Trenton Passow G, DO   sulfamethoxazole-trimethoprim (BACTRIM DS,SEPTRA DS) 800-160 MG tablet Take 1 tablet by mouth 2 (two) times daily for 7 days. 14 tablet Tommie Sams, DO     Controlled Substance Prescriptions [place] Controlled Substance Registry consulted? Not Applicable   Tommie Sams, DO 07/05/18 1821

## 2018-07-08 ENCOUNTER — Other Ambulatory Visit: Payer: Self-pay | Admitting: Family Medicine

## 2018-07-14 DIAGNOSIS — Z720 Tobacco use: Secondary | ICD-10-CM | POA: Diagnosis not present

## 2018-07-14 DIAGNOSIS — Z79899 Other long term (current) drug therapy: Secondary | ICD-10-CM | POA: Diagnosis not present

## 2018-07-19 DIAGNOSIS — J449 Chronic obstructive pulmonary disease, unspecified: Secondary | ICD-10-CM | POA: Diagnosis not present

## 2018-07-19 DIAGNOSIS — I720 Aneurysm of carotid artery: Secondary | ICD-10-CM | POA: Diagnosis not present

## 2018-08-04 ENCOUNTER — Other Ambulatory Visit: Payer: Self-pay | Admitting: Family Medicine

## 2018-08-04 DIAGNOSIS — E782 Mixed hyperlipidemia: Secondary | ICD-10-CM

## 2018-08-05 ENCOUNTER — Ambulatory Visit (INDEPENDENT_AMBULATORY_CARE_PROVIDER_SITE_OTHER): Payer: Medicare Other | Admitting: Physician Assistant

## 2018-08-05 ENCOUNTER — Encounter: Payer: Self-pay | Admitting: Physician Assistant

## 2018-08-05 VITALS — BP 174/112 | HR 78 | Temp 97.8°F | Resp 16 | Ht 72.0 in | Wt 267.4 lb

## 2018-08-05 DIAGNOSIS — Z23 Encounter for immunization: Secondary | ICD-10-CM | POA: Diagnosis not present

## 2018-08-05 DIAGNOSIS — G4730 Sleep apnea, unspecified: Secondary | ICD-10-CM | POA: Diagnosis not present

## 2018-08-05 DIAGNOSIS — E782 Mixed hyperlipidemia: Secondary | ICD-10-CM | POA: Diagnosis not present

## 2018-08-05 DIAGNOSIS — K432 Incisional hernia without obstruction or gangrene: Secondary | ICD-10-CM

## 2018-08-05 DIAGNOSIS — I10 Essential (primary) hypertension: Secondary | ICD-10-CM

## 2018-08-05 DIAGNOSIS — R0902 Hypoxemia: Secondary | ICD-10-CM | POA: Insufficient documentation

## 2018-08-05 MED ORDER — LISINOPRIL-HYDROCHLOROTHIAZIDE 20-12.5 MG PO TABS: 1.0000 | ORAL_TABLET | Freq: Every day | ORAL | 0 refills | Status: DC
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
## 2018-08-06 ENCOUNTER — Telehealth: Payer: Self-pay

## 2018-08-06 LAB — COMPREHENSIVE METABOLIC PANEL
ALT: 28 IU/L (ref 0–44)
AST: 22 IU/L (ref 0–40)
Albumin/Globulin Ratio: 1.9 (ref 1.2–2.2)
Albumin: 4.5 g/dL (ref 3.5–5.5)
Alkaline Phosphatase: 88 IU/L (ref 39–117)
BUN/Creatinine Ratio: 11 (ref 9–20)
BUN: 12 mg/dL (ref 6–24)
Bilirubin Total: 0.3 mg/dL (ref 0.0–1.2)
CO2: 23 mmol/L (ref 20–29)
Calcium: 9.5 mg/dL (ref 8.7–10.2)
Chloride: 101 mmol/L (ref 96–106)
Creatinine, Ser: 1.09 mg/dL (ref 0.76–1.27)
GFR calc Af Amer: 88 mL/min/{1.73_m2} (ref >59)
GFR calc non Af Amer: 77 mL/min/{1.73_m2} (ref >59)
Globulin, Total: 2.4 g/dL (ref 1.5–4.5)
Glucose: 115 mg/dL — ABNORMAL HIGH (ref 65–99)
Potassium: 3.8 mmol/L (ref 3.5–5.2)
Sodium: 142 mmol/L (ref 134–144)
Total Protein: 6.9 g/dL (ref 6.0–8.5)

## 2018-08-06 LAB — LIPID PANEL
Chol/HDL Ratio: 4.3 ratio (ref 0.0–5.0)
Cholesterol, Total: 129 mg/dL (ref 100–199)
HDL: 30 mg/dL — ABNORMAL LOW (ref >39)
LDL Calculated: 64 mg/dL (ref 0–99)
Triglycerides: 176 mg/dL — ABNORMAL HIGH (ref 0–149)
VLDL Cholesterol Cal: 35 mg/dL (ref 5–40)

## 2018-08-06 NOTE — Telephone Encounter (Addendum)
Patient has been advised of results.    ----- Message from Trey Sailors, PA-C sent at 08/06/2018  8:38 AM EDT ----- Labwork stable, will see him back for BP follow up.

## 2018-08-06 NOTE — Patient Instructions (Signed)

## 2018-08-06 NOTE — Telephone Encounter (Deleted)
-----   Message from Trey Sailors, [place] sent at 08/06/2018  8:38 AM EDT ----- Loney Laurence stable, will see him back for BP follow up.

## 2018-08-06 NOTE — Telephone Encounter (Signed)
-----   Message from Adriana M Pollak, PA-C sent at 08/06/2018  8:38 AM EDT ----- Labwork stable, will see him back for BP follow up. 

## 2018-08-16 ENCOUNTER — Emergency Department
Admission: EM | Admit: 2018-08-16 | Discharge: 2018-08-16 | Disposition: A | Discharge status: Home / Self Care | Payer: Medicare Other | Attending: Emergency Medicine | Admitting: Emergency Medicine

## 2018-08-16 ENCOUNTER — Other Ambulatory Visit: Payer: Self-pay

## 2018-08-16 DIAGNOSIS — Z79899 Other long term (current) drug therapy: Secondary | ICD-10-CM | POA: Insufficient documentation

## 2018-08-16 DIAGNOSIS — F17210 Nicotine dependence, cigarettes, uncomplicated: Secondary | ICD-10-CM | POA: Diagnosis not present

## 2018-08-16 DIAGNOSIS — Y999 Unspecified external cause status: Secondary | ICD-10-CM | POA: Diagnosis not present

## 2018-08-16 DIAGNOSIS — S80861A Insect bite (nonvenomous), right lower leg, initial encounter: Secondary | ICD-10-CM | POA: Insufficient documentation

## 2018-08-16 DIAGNOSIS — W57XXXA Bitten or stung by nonvenomous insect and other nonvenomous arthropods, initial encounter: Secondary | ICD-10-CM | POA: Insufficient documentation

## 2018-08-16 DIAGNOSIS — J449 Chronic obstructive pulmonary disease, unspecified: Secondary | ICD-10-CM | POA: Insufficient documentation

## 2018-08-16 DIAGNOSIS — L03114 Cellulitis of left upper limb: Secondary | ICD-10-CM

## 2018-08-16 DIAGNOSIS — Y92009 Unspecified place in unspecified non-institutional (private) residence as the place of occurrence of the external cause: Secondary | ICD-10-CM | POA: Insufficient documentation

## 2018-08-16 DIAGNOSIS — S60862A Insect bite (nonvenomous) of left wrist, initial encounter: Secondary | ICD-10-CM

## 2018-08-16 DIAGNOSIS — I10 Essential (primary) hypertension: Secondary | ICD-10-CM | POA: Insufficient documentation

## 2018-08-16 DIAGNOSIS — Y9389 Activity, other specified: Secondary | ICD-10-CM | POA: Diagnosis not present

## 2018-08-16 DIAGNOSIS — R2232 Localized swelling, mass and lump, left upper limb: Secondary | ICD-10-CM | POA: Diagnosis present

## 2018-08-16 MED ORDER — SULFAMETHOXAZOLE-TRIMETHOPRIM 800-160 MG PO TABS
2.0000 | ORAL_TABLET | Freq: Once | ORAL | Status: AC
  Administered 2018-08-16: 2 via ORAL
  Filled 2018-08-16: qty 2

## 2018-08-16 MED ORDER — SULFAMETHOXAZOLE-TRIMETHOPRIM 800-160 MG PO TABS: 2.0000 | ORAL_TABLET | Freq: Two times a day (BID) | ORAL | 0 refills | Status: DC

## 2018-08-16 MED ORDER — CEPHALEXIN 500 MG PO CAPS
500.0000 mg | ORAL_CAPSULE | Freq: Once | ORAL | Status: AC
  Administered 2018-08-16: 500 mg via ORAL
  Filled 2018-08-16: qty 1

## 2018-08-16 MED ORDER — CEPHALEXIN 500 MG PO CAPS: 500.0000 mg | ORAL_CAPSULE | Freq: Four times a day (QID) | ORAL | 0 refills | Status: DC

## 2018-08-16 NOTE — ED Provider Notes (Signed)
[facility] Emergency Department Provider Note  ____________________________________________   First MD Initiated Contact with Patient 08/16/18 0408     (approximate)  I have reviewed the triage vital signs and the nursing notes.   HISTORY  Chief Complaint Insect Bite    HPI Eric Lawrence is a 52 y.o. male who presents for evaluation of some spreading redness of his left wrist at the site of what he believes to be an insect or spider bite.  He was seen by his primary care provider about a month ago for a similar issue.  He states he does a lot of work out in the garden flower bed and gets bitten frequently.  He sustained an insect bite of some sort to his left wrist near the thumb.  This occurred a couple of days ago and it is been itchy and somewhat painful but he got up in the middle the night to let his dogs out and he discovered that he has an area of redness and swelling surrounding the wound.  He felt he should come to the emergency department right away for further evaluation.  Also while he was sitting outside he felt something crawling on his right leg and felt multiple stinging bites and believes he was stung by ants.  He has multiple small red bite marks on his right lower leg and foot.  He denies fever/chills, chest pain or shortness of breath, nausea, vomiting, and abdominal pain.  He says that his symptoms improved after the antibiotics last month and he still has a large tube of mupirocin ointment.  Past Medical History:  Diagnosis Date  . Acute cholecystitis 06/29/2017  . 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 Airway hyperreactivity 04/19/2015  . Anxiety   . Anxiety disorder 04/19/2015  . Arthritis 04/19/2015  . At risk for injury 04/19/2015  . CAFL (chronic airflow limitation) (HCC) 04/19/2015  . Cholecystitis 06/29/2017  . Chronic gastritis 04/19/2015  . Compulsive tobacco user syndrome 04/19/2015  . COPD (chronic obstructive pulmonary disease)  (HCC)   . Depression   . Encounter for immunization 04/19/2015  . GERD (gastroesophageal reflux disease)   . H/O elevated lipids 04/19/2015  . Hypertension   . Mechanical and motor problems with internal organs 04/19/2015  . Obesity 09/10/2015  . Oxygen deficiency   . Sleep apnea   . Trigger thumb of left hand 04/15/2017    Patient Active Problem List   Diagnosis Date Noted  . Sleep apnea 08/05/2018  . Abnormal glucose 09/08/2017  . Biliary colic   . S/P cholecystectomy 06/29/2017  . History of cholecystitis 06/29/2017  . Trigger thumb of left hand 04/15/2017  . Obesity 09/10/2015  . Anxiety disorder 04/19/2015  . Arthritis 04/19/2015  . Airway hyperreactivity 04/19/2015  . COPD (chronic obstructive pulmonary disease) (HCC) 04/19/2015  . 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 Hyperlipidemia 04/19/2015  . Essential hypertension 04/19/2015  . Compulsive tobacco user syndrome 04/19/2015  . Chronic gastritis 04/19/2015    Past Surgical History:  Procedure Laterality Date  . CHOLECYSTECTOMY N/A 08/06/2017   Procedure: LAPAROSCOPIC CHOLECYSTECTOMY;  Surgeon: Lattie Haw, MD;  Location: [facility] ORS;  Service: General;  Laterality: N/A;  . cyst removal from wrist      Prior to Admission medications   Medication Sig Start Date End Date Taking? Authorizing Provider  albuterol (PROVENTIL HFA;VENTOLIN HFA) 108 (90 Base) MCG/ACT inhaler Inhale 2 puffs every 4 (four) hours as needed into the lungs for wheezing or shortness of  breath. 09/08/17   Karamalegos, Netta Neat, DO  amLODipine (NORVASC) 10 MG tablet TAKE ONE TABLET BY MOUTH EVERY DAY 06/11/18   Althea Charon, Netta Neat, DO  buPROPion (WELLBUTRIN XL) 150 MG 24 hr tablet 300 mg daily.  12/09/17   [provider]  cephALEXin (KEFLEX) 500 MG capsule Take 1 capsule (500 mg total) by mouth 4 (four) times daily. 08/16/18   Loleta Rose, MD  lisinopril-hydrochlorothiazide (ZESTORETIC) 20-12.5 MG tablet Take 1 tablet by mouth daily.  08/05/18   Trey Sailors, PA-C  nicotine (NICODERM CQ - DOSED IN MG/24 HOURS) 21 mg/24hr patch APPLY TO SKIN ONCE A DAY, REMOVE BEFORE BEDTIME, USE ALTERNATE LOCATIONS 02/25/18   [provider]  OXYGEN Inhale 2 L into the lungs daily as needed (nightly).    [provider]  rosuvastatin (CRESTOR) 10 MG tablet Take 1 tablet (10 mg total) by mouth daily. 03/15/18   Karamalegos, Netta Neat, DO  sulfamethoxazole-trimethoprim (BACTRIM DS,SEPTRA DS) 800-160 MG tablet Take 2 tablets by mouth 2 (two) times daily. 08/16/18   Loleta Rose, MD    Allergies Patient has no known allergies.  Family History  Problem Relation Age of Onset  . Stroke Mother 18  . Diabetes Mother   . Kidney disease Mother   . Hypertension Mother   . COPD Maternal Aunt   . Diabetes Maternal Grandmother   . Heart disease Sister     Social History Social History   Tobacco Use  . Smoking status: Current Every Day Smoker    Packs/day: 0.25    Types: Cigarettes  . Smokeless tobacco: Never Used  Substance Use Topics  . Alcohol use: No    Alcohol/week: 0.0 standard drinks  . Drug use: No    Review of Systems Constitutional: No fever/chills ENT: No sore throat. Cardiovascular: Denies chest pain. Respiratory: Denies shortness of breath. Gastrointestinal: No abdominal pain.  No nausea, no vomiting.  No diarrhea.  No constipation. Genitourinary: Negative for dysuria. Musculoskeletal: Negative for neck pain.  Negative for back pain. Integumentary: Itchy and painful rash on his right lower leg as well as a couple of lesions on his hands and wrist including the left wrist lesion described above. Neurological: Negative for headaches, focal weakness or numbness.   ____________________________________________   PHYSICAL EXAM:  VITAL SIGNS: ED Triage Vitals  Enc Vitals Group     BP 08/16/18 0242 (!) 172/102     Pulse Rate 08/16/18 0242 91     Resp 08/16/18 0242 18     Temp 08/16/18 0242 97.9  F (36.6 C)     Temp Source 08/16/18 0242 Oral     SpO2 08/16/18 0242 97 %     Weight --      Height --      Head Circumference --      Peak Flow --      Pain Score 08/16/18 0241 0     Pain Loc --      Pain Edu? --      Excl. in GC? --     Constitutional: Alert and oriented.  Somewhat disheveled but generally well-appearing and in no acute distress. Eyes: Conjunctivae are normal.  Head: Atraumatic. Nose: No congestion/rhinnorhea. Mouth/Throat: Mucous membranes are moist. Neck: No stridor.  No meningeal signs.   Cardiovascular: Normal rate, regular rhythm.  Respiratory: Normal respiratory effort.  No retractions.  Neurologic:  Normal speech and language. No gross focal neurologic deficits are appreciated.  Skin: The patient has a small lesion that  appears somewhat excoriated near his left thumb on his wrist with about 5 cm in diameter of erythema consistent with cellulitis.  There is no purulence or fluctuance nor induration.  He has multiple milder spots on his hands and arms as well as what appear to be several acute red maculopapular spots on his right lower leg that are most likely consistent with insect bites as well.  There is no sign of cellulitis other than the left wrist as documented above. Psychiatric: Mood and affect are normal. Speech and behavior are normal.  ____________________________________________   LABS (all labs ordered are listed, but only abnormal results are displayed)  Labs Reviewed - No data to display ____________________________________________  EKG  None - EKG not ordered by ED physician ____________________________________________  RADIOLOGY   ED MD interpretation: No indication for imaging  Official radiology report(s): No results found.  ____________________________________________   PROCEDURES  Critical Care performed: No   Procedure(s) performed:   Procedures   ____________________________________________   INITIAL  IMPRESSION / ASSESSMENT AND PLAN / ED COURSE  As part of my medical decision making, I reviewed the following data within the electronic MEDICAL RECORD NUMBER Nursing notes reviewed and incorporated, Old chart reviewed and Notes from prior ED visits    The rash does not appear consistent with Sporotrichosis, purulent cellulitis, dermatitis, poison ivy/oak, etc.  It appears that he did have an insect bite of some sort that he is excoriated and now has a superficial cellulitis.  Given the possibilities of staph including MRSA and strep infection, I am treating him broadly with Bactrim DS 2 tablets by mouth twice daily for 10 days and Keppra 500 mg by mouth 4 times a day for 10 days.  Encouraged him to finish the full course of both treatments and also to use the mupirocin ointment on any open wounds that he has.  There is no indication for further lab work at this time.  I gave my usual and customary return precautions.     ____________________________________________  FINAL CLINICAL IMPRESSION(S) / ED DIAGNOSES  Final diagnoses:  Insect bite of left wrist, initial encounter  Insect bite of right lower leg, initial encounter  Cellulitis of left wrist     MEDICATIONS GIVEN DURING THIS VISIT:  Medications  sulfamethoxazole-trimethoprim (BACTRIM DS,SEPTRA DS) 800-160 MG per tablet 2 tablet (2 tablets Oral Given 08/16/18 0429)  cephALEXin (KEFLEX) capsule 500 mg (500 mg Oral Given 08/16/18 0429)     ED Discharge Orders         Ordered    cephALEXin (KEFLEX) 500 MG capsule  4 times daily     08/16/18 0423    sulfamethoxazole-trimethoprim (BACTRIM DS,SEPTRA DS) 800-160 MG tablet  2 times daily     08/16/18 0423           Note:  This document was prepared using Dragon voice recognition software and may include unintentional dictation errors.    Loleta Rose, MD 08/16/18 [phone]

## 2018-08-16 NOTE — ED Triage Notes (Signed)
Patient reports bitten by something while out in his garden 2 days ago.

## 2018-08-18 ENCOUNTER — Other Ambulatory Visit: Payer: Self-pay

## 2018-08-18 ENCOUNTER — Ambulatory Visit (INDEPENDENT_AMBULATORY_CARE_PROVIDER_SITE_OTHER): Payer: Medicare Other | Admitting: Surgery

## 2018-08-18 ENCOUNTER — Telehealth: Payer: Self-pay

## 2018-08-18 ENCOUNTER — Encounter: Payer: Self-pay | Admitting: Surgery

## 2018-08-18 VITALS — BP 146/87 | HR 82 | Temp 97.5°F | Resp 13 | Ht 74.0 in | Wt 264.0 lb

## 2018-08-18 DIAGNOSIS — K432 Incisional hernia without obstruction or gangrene: Secondary | ICD-10-CM | POA: Diagnosis not present

## 2018-08-18 DIAGNOSIS — I720 Aneurysm of carotid artery: Secondary | ICD-10-CM | POA: Diagnosis not present

## 2018-08-18 DIAGNOSIS — J449 Chronic obstructive pulmonary disease, unspecified: Secondary | ICD-10-CM | POA: Diagnosis not present

## 2018-08-18 NOTE — Telephone Encounter (Signed)
The patient is scheduled for pre admit testing at the hospital on 08/24/18 at 11:15 am. I was unable to leave a message for the patient due to the mailbox is full.

## 2018-08-18 NOTE — H&P (View-Only) (Signed)
08/18/2018  History of Present Illness: Eric Lawrence AGE is a 52 y.o. male s/p laparoscopic cholecystectomy with Dr. Excell Seltzer about a year ago.  He presents today because of concerns for an area of bulging in the epigastric region.  He reports that he will still get occasional bloating and constipation and that he also gets discomfort at the epigastric region when the bulge protrudes.  Denies any nausea or vomiting, fevers or chills.  Of note, he is currently on antibiotics for significant ant bites on his legs and hands.  Past Medical History: Past Medical History:  Diagnosis Date  . Acute cholecystitis 06/29/2017  . 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 Airway hyperreactivity 04/19/2015  . Anxiety   . Anxiety disorder 04/19/2015  . Arthritis 04/19/2015  . At risk for injury 04/19/2015  . CAFL (chronic airflow limitation) (HCC) 04/19/2015  . Cholecystitis 06/29/2017  . Chronic gastritis 04/19/2015  . Compulsive tobacco user syndrome 04/19/2015  . COPD (chronic obstructive pulmonary disease) (HCC)   . Depression   . Encounter for immunization 04/19/2015  . GERD (gastroesophageal reflux disease)   . H/O elevated lipids 04/19/2015  . Hypertension   . Mechanical and motor problems with internal organs 04/19/2015  . Obesity 09/10/2015  . Oxygen deficiency   . Sleep apnea   . Trigger thumb of left hand 04/15/2017     Past Surgical History: Past Surgical History:  Procedure Laterality Date  . CHOLECYSTECTOMY N/A 08/06/2017   Procedure: LAPAROSCOPIC CHOLECYSTECTOMY;  Surgeon: Lattie Haw, MD;  Location: [facility] ORS;  Service: General;  Laterality: N/A;  . cyst removal from wrist      Home Medications: Prior to Admission medications   Medication Sig Start Date End Date Taking? Authorizing Provider  Acetaminophen (TYLENOL) 325 MG CAPS Take by mouth.   Yes [provider]  albuterol (PROVENTIL HFA;VENTOLIN HFA) 108 (90 Base) MCG/ACT inhaler Inhale 2 puffs every 4 (four) hours as  needed into the lungs for wheezing or shortness of breath. 09/08/17  Yes Karamalegos, Netta Neat, DO  amLODipine (NORVASC) 10 MG tablet TAKE ONE TABLET BY MOUTH EVERY DAY 06/11/18  Yes Karamalegos, Netta Neat, DO  buPROPion (WELLBUTRIN XL) 150 MG 24 hr tablet 300 mg daily.  12/09/17  Yes [provider]  cephALEXin (KEFLEX) 500 MG capsule Take 1 capsule (500 mg total) by mouth 4 (four) times daily. 08/16/18  Yes Loleta Rose, MD  lisinopril-hydrochlorothiazide (ZESTORETIC) 20-12.5 MG tablet Take 1 tablet by mouth daily. 08/05/18  Yes Pollak, Adriana M, PA-C  nicotine (NICODERM CQ - DOSED IN MG/24 HOURS) 21 mg/24hr patch APPLY TO SKIN ONCE A DAY, REMOVE BEFORE BEDTIME, USE ALTERNATE LOCATIONS 02/25/18  Yes [provider]  OXYGEN Inhale 2 L into the lungs daily as needed (nightly).   Yes [provider]  rosuvastatin (CRESTOR) 10 MG tablet Take 1 tablet (10 mg total) by mouth daily. 03/15/18  Yes Karamalegos, Netta Neat, DO  sulfamethoxazole-trimethoprim (BACTRIM DS,SEPTRA DS) 800-160 MG tablet Take 2 tablets by mouth 2 (two) times daily. 08/16/18  Yes Loleta Rose, MD    Allergies: No Known Allergies  Review of Systems: Review of Systems  Constitutional: Negative for chills and fever.  Respiratory: Negative for shortness of breath.   Cardiovascular: Negative for chest pain.  Gastrointestinal: Positive for abdominal pain and constipation. Negative for nausea and vomiting.    Physical Exam BP (!) 146/87   Pulse 82   Temp (!) 97.5 F (36.4 C) (Temporal)   Resp 13   Ht  6\' 2"  (1.88 m)   Wt 264 lb (119.7 kg)   SpO2 92%   BMI 33.90 kg/m  CONSTITUTIONAL: No acute distress HEENT:  Normocephalic, atraumatic, extraocular motion intact. RESPIRATORY:  Lungs are clear, and breath sounds are equal bilaterally. Normal respiratory effort without pathologic use of accessory muscles. CARDIOVASCULAR: Heart is regular without murmurs, gallops, or rubs. GI: The abdomen is soft,  nondistended, currently nontender to palpation.  He does have a small epigastric incisional hernia, at the subxiphoid port location.  He also has what appears to be diastasis recti of the upper abdomen, and the hernia is included in this diastasis. There were no palpable masses. There was no hepatosplenomegaly. NEUROLOGIC:  Motor and sensation is grossly normal.  Cranial nerves are grossly intact. PSYCH:  Alert and oriented to person, place and time. Affect is normal.   Assessment and Plan: This is a 52 y.o. male with an incisional hernia at the subxiphoid port location, s/p lap chole about a year ago.  He also has diastasis recti.  Discussed with the patient that the larger bulging that he sees is not really a hernia defect but diastasis recti.  In this case, there is no surgery indicated for this.  However, he does also have an epigastric incisional hernia within the diastasis, and this does warrant repair.  He is concerned that he has a larger hernia and to confirm, we will order a CT scan of abdomen with contrast to evaluate for a hernia and size/location.    We will schedule the patient for an incisional hernia repair.  If the CT shows that it's not diastasis but a larger hernia defect, then we would plan for a larger hernia repair, but otherwise, it would only be the incisional hernia.  Discussed with patient the risks of bleeding, infection, injury to surrounding structures, possible use of mesh, and he's willing to proceed.  He will be scheduled for 10/28.  Face-to-face time spent with the patient and care providers was 25 minutes, with more than 50% of the time spent counseling, educating, and coordinating care of the patient.     Howie Ill, MD [facility]

## 2018-08-18 NOTE — Patient Instructions (Addendum)
The patient is scheduled for a Ct Abdomen and Pelvis with contrast at [facility] on 08/25/18 at 8:00 am. He will arrive by 7:45 am and have noting to eat or drink for 4 hours prior. He will pick up a prep kit today.  The patient is scheduled for surgery at [facility] with Dr Hampton Abbot on 08/30/18. We will call him about his pre admit testing appointment. The patient is aware of date and instructions.     Patient will need an Abdominal/Pelvis Ct Scan with contrast before surgery. Please schedule patient for incisional hernia repair.   Diastasis Recti Diastasis recti is when the muscles of the abdomen (rectus abdominis muscles) become thin and separate. The result is a wider space between the right and left abdomen (abdominal) muscles. This wider space between the muscles may cause a bulge in the middle of your abdomen. You may notice this bulge when you are straining or when you sit up from a lying down position. Diastasis recti can affect men and women. It is most common among pregnant women, infants, people who are obese, and people who have had abdominal surgery. Exercise or surgical treatment may help correct it. What are the causes? Common causes of this condition include:  Pregnancy. The growing uterus puts pressure on the abdominal muscles, which causes the muscles to separate.  Obesity. Excess fat puts pressure on abdominal muscles.  Weightlifting.  Some abdomen exercises.  Advanced age.  Genetics.  Prior abdominal surgery.  What increases the risk? This condition is more likely to develop in:  Women.  Newborns, especially newborns who are born early (prematurely).  What are the signs or symptoms? Common symptoms of this condition include:  A bulge in the middle of the abdomen. You will notice it most when you sit up or strain.  Pain in the low back, pelvis, or hips.  Constipation.  Inability to control when you urinate (urinary incontinence).  Bloating.  Poor  posture.  How is this diagnosed? This condition is diagnosed with a physical exam. Your health care provider will ask you to lie flat on your back and do a crunch or half sit-up. If you have diastasis recti, a vertical bulge will appear between your abdominal muscles in the center of your abdomen. Your health care provider will measure the gap between your muscles with one of the following:  A medical device used to measure the space between two objects (caliper).  A tape measure.  CT scan.  Ultrasound.  Finger spaces. Your health care provider will measure the space using their fingers.  How is this treated? If your muscle separation is not too large, you may not need treatment. However, if you are a woman who plans to become pregnant again, you should treat this condition before your next pregnancy. Treatment may include:  Physical therapy to strengthen and tighten your abdominal muscles.  Lifestyle changes such as weight loss and exercise.  Over-the-counter pain medicines as needed.  Surgery to correct the separation.  Follow these instructions at home: Activity  Return to your normal activities as told by your health care provider. Ask your health care provider what activities are safe for you.  When lifting weights or doing exercises using your abdominal muscles or the muscles in the center of your body that give stability (core muscles), make sure you are doing your exercises and movements correctly. Proper form can help to prevent the condition from happening again. General instructions  If you are overweight, ask your  health care provider for help with weight loss. Losing even a small amount of weight can help to improve your diastasis recti.  Take over-the-counter or prescription medicines only as told by your health care provider.  Do not strain. Straining can make the separation worse. Examples of straining include: ? Pushing hard to have a bowel movement, such as  due to constipation. ? Lifting heavy objects, including children. ? Standing up and sitting down.  Take steps to prevent constipation: ? Drink enough fluid to keep your urine clear or pale yellow. ? Take over-the-counter or prescription medicines only as directed. ? Eat foods that are high in fiber, such as fresh fruits and vegetables, whole grains, and beans. ? Limit foods that are high in fat and processed sugars, such as fried and sweet foods. Contact a health care provider if:  You notice a new bulge in your abdomen. Get help right away if:  You experience severe discomfort in your abdomen.  You develop severe abdominal pain along with nausea, vomiting, or fever. Summary  Diastasis recti is when the abdomen (abdominal) muscles become thin and separate. Your abdomen will stick out because the space between your right and left abdomen muscles has widened.  The most common symptom is a bulge in your abdomen. You will notice it most when you sit up or are straining.  This condition is diagnosed during a physical exam.  If the abdomen separation is not too big, you may choose not to have treatment. Otherwise, you may need to undergo physical therapy or surgery. This information is not intended to replace advice given to you by your health care provider. Make sure you discuss any questions you have with your health care provider. Document Released: 12/15/2016 Document Revised: 12/15/2016 Document Reviewed: 12/15/2016 Elsevier Interactive Patient Education  [employer].

## 2018-08-18 NOTE — Progress Notes (Signed)
08/18/2018  History of Present Illness: Eric Lawrence is a 52 y.o. male s/p laparoscopic cholecystectomy with Dr. Cooper about a year ago.  He presents today because of concerns for an area of bulging in the epigastric region.  He reports that he will still get occasional bloating and constipation and that he also gets discomfort at the epigastric region when the bulge protrudes.  Denies any nausea or vomiting, fevers or chills.  Of note, he is currently on antibiotics for significant ant bites on his legs and hands.  Past Medical History: Past Medical History:  Diagnosis Date  . Acute cholecystitis 06/29/2017  . 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 Airway hyperreactivity 04/19/2015  . Anxiety   . Anxiety disorder 04/19/2015  . Arthritis 04/19/2015  . At risk for injury 04/19/2015  . CAFL (chronic airflow limitation) (HCC) 04/19/2015  . Cholecystitis 06/29/2017  . Chronic gastritis 04/19/2015  . Compulsive tobacco user syndrome 04/19/2015  . COPD (chronic obstructive pulmonary disease) (HCC)   . Depression   . Encounter for immunization 04/19/2015  . GERD (gastroesophageal reflux disease)   . H/O elevated lipids 04/19/2015  . Hypertension   . Mechanical and motor problems with internal organs 04/19/2015  . Obesity 09/10/2015  . Oxygen deficiency   . Sleep apnea   . Trigger thumb of left hand 04/15/2017     Past Surgical History: Past Surgical History:  Procedure Laterality Date  . CHOLECYSTECTOMY N/A 08/06/2017   Procedure: LAPAROSCOPIC CHOLECYSTECTOMY;  Surgeon: Cooper, Richard E, MD;  Location: [facility] ORS;  Service: General;  Laterality: N/A;  . cyst removal from wrist      Home Medications: Prior to Admission medications   Medication Sig Start Date End Date Taking? Authorizing Provider  Acetaminophen (TYLENOL) 325 MG CAPS Take by mouth.   Yes [provider]  albuterol (PROVENTIL HFA;VENTOLIN HFA) 108 (90 Base) MCG/ACT inhaler Inhale 2 puffs every 4 (four) hours as  needed into the lungs for wheezing or shortness of breath. 09/08/17  Yes Karamalegos, Alexander J, DO  amLODipine (NORVASC) 10 MG tablet TAKE ONE TABLET BY MOUTH EVERY DAY 06/11/18  Yes Karamalegos, Alexander J, DO  buPROPion (WELLBUTRIN XL) 150 MG 24 hr tablet 300 mg daily.  12/09/17  Yes [provider]  cephALEXin (KEFLEX) 500 MG capsule Take 1 capsule (500 mg total) by mouth 4 (four) times daily. 08/16/18  Yes Forbach, Cory, MD  lisinopril-hydrochlorothiazide (ZESTORETIC) 20-12.5 MG tablet Take 1 tablet by mouth daily. 08/05/18  Yes Pollak, Adriana M, PA-C  nicotine (NICODERM CQ - DOSED IN MG/24 HOURS) 21 mg/24hr patch APPLY TO SKIN ONCE A DAY, REMOVE BEFORE BEDTIME, USE ALTERNATE LOCATIONS 02/25/18  Yes [provider]  OXYGEN Inhale 2 L into the lungs daily as needed (nightly).   Yes [provider]  rosuvastatin (CRESTOR) 10 MG tablet Take 1 tablet (10 mg total) by mouth daily. 03/15/18  Yes Karamalegos, Alexander J, DO  sulfamethoxazole-trimethoprim (BACTRIM DS,SEPTRA DS) 800-160 MG tablet Take 2 tablets by mouth 2 (two) times daily. 08/16/18  Yes Forbach, Cory, MD    Allergies: No Known Allergies  Review of Systems: Review of Systems  Constitutional: Negative for chills and fever.  Respiratory: Negative for shortness of breath.   Cardiovascular: Negative for chest pain.  Gastrointestinal: Positive for abdominal pain and constipation. Negative for nausea and vomiting.    Physical Exam BP (!) 146/87   Pulse 82   Temp (!) 97.5 F (36.4 C) (Temporal)   Resp 13   Ht   6' 2" (1.88 m)   Wt 264 lb (119.7 kg)   SpO2 92%   BMI 33.90 kg/m  CONSTITUTIONAL: No acute distress HEENT:  Normocephalic, atraumatic, extraocular motion intact. RESPIRATORY:  Lungs are clear, and breath sounds are equal bilaterally. Normal respiratory effort without pathologic use of accessory muscles. CARDIOVASCULAR: Heart is regular without murmurs, gallops, or rubs. GI: The abdomen is soft,  nondistended, currently nontender to palpation.  He does have a small epigastric incisional hernia, at the subxiphoid port location.  He also has what appears to be diastasis recti of the upper abdomen, and the hernia is included in this diastasis. There were no palpable masses. There was no hepatosplenomegaly. NEUROLOGIC:  Motor and sensation is grossly normal.  Cranial nerves are grossly intact. PSYCH:  Alert and oriented to person, place and time. Affect is normal.   Assessment and Plan: This is a 52 y.o. male with an incisional hernia at the subxiphoid port location, s/p lap chole about a year ago.  He also has diastasis recti.  Discussed with the patient that the larger bulging that he sees is not really a hernia defect but diastasis recti.  In this case, there is no surgery indicated for this.  However, he does also have an epigastric incisional hernia within the diastasis, and this does warrant repair.  He is concerned that he has a larger hernia and to confirm, we will order a CT scan of abdomen with contrast to evaluate for a hernia and size/location.    We will schedule the patient for an incisional hernia repair.  If the CT shows that it's not diastasis but a larger hernia defect, then we would plan for a larger hernia repair, but otherwise, it would only be the incisional hernia.  Discussed with patient the risks of bleeding, infection, injury to surrounding structures, possible use of mesh, and he's willing to proceed.  He will be scheduled for 10/28.  Face-to-face time spent with the patient and care providers was 25 minutes, with more than 50% of the time spent counseling, educating, and coordinating care of the patient.     Yessika Otte Luis Laraine Samet, MD [facility]    

## 2018-08-19 NOTE — Telephone Encounter (Signed)
Patient notified of pre admit date and time.

## 2018-08-24 ENCOUNTER — Other Ambulatory Visit: Payer: Self-pay

## 2018-08-24 ENCOUNTER — Encounter
Admission: RE | Admit: 2018-08-24 | Discharge: 2018-08-24 | Disposition: A | Discharge status: Home / Self Care | Payer: Medicare Other | Source: Ambulatory Visit | Attending: Surgery | Admitting: Surgery

## 2018-08-24 DIAGNOSIS — Z01818 Encounter for other preprocedural examination: Secondary | ICD-10-CM

## 2018-08-24 DIAGNOSIS — N281 Cyst of kidney, acquired: Secondary | ICD-10-CM | POA: Diagnosis not present

## 2018-08-24 DIAGNOSIS — I10 Essential (primary) hypertension: Secondary | ICD-10-CM | POA: Diagnosis not present

## 2018-08-24 DIAGNOSIS — K432 Incisional hernia without obstruction or gangrene: Secondary | ICD-10-CM | POA: Diagnosis not present

## 2018-08-24 LAB — CBC
HEMATOCRIT: 45 % (ref 39.0–52.0)
Hemoglobin: 15.4 g/dL (ref 13.0–17.0)
MCH: 31.2 pg (ref 26.0–34.0)
MCHC: 34.2 g/dL (ref 30.0–36.0)
MCV: 91.3 fL (ref 80.0–100.0)
PLATELETS: 184 10*3/uL (ref 150–400)
RBC: 4.93 MIL/uL (ref 4.22–5.81)
RDW: 13.6 % (ref 11.5–15.5)
WBC: 6.8 10*3/uL (ref 4.0–10.5)
nRBC: 0 % (ref 0.0–0.2)

## 2018-08-24 NOTE — Patient Instructions (Signed)
Your procedure is scheduled on: 08/30/18 Report to Day Surgery. [facility]OND FLOOR To find out your arrival time please call [phone] between 1PM - 3PM on 08/27/18  Remember: Instructions that are not followed completely may result in serious medical risk,  up to and including death, or upon the discretion of your surgeon and anesthesiologist your  surgery may need to be rescheduled.     _X__ 1. Do not eat food after midnight the night before your procedure.                 No gum chewing or hard candies. You may drink clear liquids up to 2 hours                 before you are scheduled to arrive for your surgery- DO not drink clear                 liquids within 2 hours of the start of your surgery.                 Clear Liquids include:  water, apple juice without pulp, clear carbohydrate                 drink such as Clearfast of Gatorade, Black Coffee or Tea (Do not add                 anything to coffee or tea).  __X__2.  On the morning of surgery brush your teeth with toothpaste and water, you                may rinse your mouth with mouthwash if you wish.  Do not swallow any toothpaste of mouthwash.     _X__ 3.  No Alcohol for 24 hours before or after surgery.   _X__ 4.  Do Not Smoke or use e-cigarettes For 24 Hours Prior to Your Surgery.                 Do not use any chewable tobacco products for at least 6 hours prior to                 surgery.  ____  5.  Bring all medications with you on the day of surgery if instructed.   __X__  6.  Notify your doctor if there is any change in your medical condition      (cold, fever, infections).     Do not wear jewelry, make-up, hairpins, clips or nail polish. Do not wear lotions, powders, or perfumes. You may wear deodorant. Do not shave 48 hours prior to surgery. Men may shave face and neck. Do not bring valuables to the hospital.    [facility] is not responsible for any belongings or  valuables.  Contacts, dentures or bridgework may not be worn into surgery. Leave your suitcase in the car. After surgery it may be brought to your room. For patients admitted to the hospital, discharge time is determined by your treatment team.   Patients discharged the day of surgery will not be allowed to drive home.   Please read over the following fact sheets that you were given:   Surgical Site Infection Prevention    __X__ Take these medicines the morning of surgery with A SIP OF WATER:    1. AMLODIPINE  2. BUPROPION  3. ROSUVASTIN  4.  5.  6.  ____ Fleet Enema (as directed)   _X___ Use CHG Soap as directed  __X__ Use inhalers on the day of surgery   AND BRING TO HOSPITAL DAY OF SURGERY  ____ Stop metformin 2 days prior to surgery    ____ Take 1/2 of usual insulin dose the night before surgery. No insulin the morning          of surgery.   ____ Stop Coumadin/Plavix/aspirin on ____ Stop Anti-inflammatories on    ____ Stop supplements until after surgery.    _X___ Bring C-Pap to the hospital.

## 2018-08-25 ENCOUNTER — Ambulatory Visit
Admission: RE | Admit: 2018-08-25 | Discharge: 2018-08-25 | Disposition: A | Discharge status: Home / Self Care | Payer: Medicare Other | Source: Ambulatory Visit | Attending: Surgery | Admitting: Surgery

## 2018-08-25 DIAGNOSIS — R1011 Right upper quadrant pain: Secondary | ICD-10-CM | POA: Diagnosis not present

## 2018-08-25 DIAGNOSIS — N281 Cyst of kidney, acquired: Secondary | ICD-10-CM | POA: Diagnosis not present

## 2018-08-25 DIAGNOSIS — K432 Incisional hernia without obstruction or gangrene: Secondary | ICD-10-CM

## 2018-08-25 MED ORDER — IOPAMIDOL (ISOVUE-300) INJECTION 61%
100.0000 mL | Freq: Once | INTRAVENOUS | Status: AC | PRN
  Administered 2018-08-25: 100 mL via INTRAVENOUS

## 2018-08-26 ENCOUNTER — Telehealth: Payer: Self-pay | Admitting: *Deleted

## 2018-08-26 ENCOUNTER — Telehealth: Payer: Self-pay | Admitting: Physician Assistant

## 2018-08-26 NOTE — Telephone Encounter (Signed)
Will call in morning after doctor gets in.

## 2018-08-26 NOTE — Telephone Encounter (Signed)
This was ordered by surgery, he should call their clinic.

## 2018-08-26 NOTE — Telephone Encounter (Signed)
Patient wants to know results of CT scan that he had done yesterday

## 2018-08-26 NOTE — Telephone Encounter (Signed)
Pt states he had a CT Scan yesterday am and wants to know if we have received the results back yet.  CB#   [phone]  Thanks  Barth Kirks

## 2018-08-26 NOTE — Telephone Encounter (Signed)
Patient advised as below.  

## 2018-08-27 NOTE — Telephone Encounter (Signed)
Small ventral hernias . Patient having surgery on 08/30/2018.

## 2018-08-30 ENCOUNTER — Ambulatory Visit: Payer: Medicare Other | Admitting: Certified Registered Nurse Anesthetist

## 2018-08-30 ENCOUNTER — Inpatient Hospital Stay: Payer: Medicare Other

## 2018-08-30 ENCOUNTER — Other Ambulatory Visit: Payer: Self-pay

## 2018-08-30 ENCOUNTER — Observation Stay
Admission: RE | Admit: 2018-08-30 | Discharge: 2018-08-31 | Disposition: A | Discharge status: Home / Self Care | Payer: Medicare Other | Source: Ambulatory Visit | Attending: Internal Medicine | Admitting: Internal Medicine

## 2018-08-30 ENCOUNTER — Encounter
Admission: RE | Disposition: A | Discharge status: Home / Self Care | Payer: Self-pay | Source: Ambulatory Visit | Attending: Internal Medicine

## 2018-08-30 ENCOUNTER — Encounter: Payer: Self-pay | Admitting: Certified Registered Nurse Anesthetist

## 2018-08-30 DIAGNOSIS — R079 Chest pain, unspecified: Secondary | ICD-10-CM | POA: Diagnosis not present

## 2018-08-30 DIAGNOSIS — K432 Incisional hernia without obstruction or gangrene: Principal | ICD-10-CM

## 2018-08-30 DIAGNOSIS — F17210 Nicotine dependence, cigarettes, uncomplicated: Secondary | ICD-10-CM | POA: Insufficient documentation

## 2018-08-30 DIAGNOSIS — K219 Gastro-esophageal reflux disease without esophagitis: Secondary | ICD-10-CM | POA: Insufficient documentation

## 2018-08-30 DIAGNOSIS — I10 Essential (primary) hypertension: Secondary | ICD-10-CM | POA: Insufficient documentation

## 2018-08-30 DIAGNOSIS — Z79899 Other long term (current) drug therapy: Secondary | ICD-10-CM | POA: Diagnosis not present

## 2018-08-30 DIAGNOSIS — J449 Chronic obstructive pulmonary disease, unspecified: Secondary | ICD-10-CM | POA: Insufficient documentation

## 2018-08-30 DIAGNOSIS — F419 Anxiety disorder, unspecified: Secondary | ICD-10-CM | POA: Diagnosis not present

## 2018-08-30 DIAGNOSIS — F329 Major depressive disorder, single episode, unspecified: Secondary | ICD-10-CM | POA: Diagnosis not present

## 2018-08-30 DIAGNOSIS — I442 Atrioventricular block, complete: Secondary | ICD-10-CM | POA: Diagnosis not present

## 2018-08-30 DIAGNOSIS — G4730 Sleep apnea, unspecified: Secondary | ICD-10-CM | POA: Insufficient documentation

## 2018-08-30 DIAGNOSIS — I4589 Other specified conduction disorders: Secondary | ICD-10-CM | POA: Diagnosis not present

## 2018-08-30 DIAGNOSIS — N281 Cyst of kidney, acquired: Secondary | ICD-10-CM | POA: Diagnosis not present

## 2018-08-30 DIAGNOSIS — M48061 Spinal stenosis, lumbar region without neurogenic claudication: Secondary | ICD-10-CM | POA: Insufficient documentation

## 2018-08-30 DIAGNOSIS — I639 Cerebral infarction, unspecified: Secondary | ICD-10-CM

## 2018-08-30 DIAGNOSIS — R29818 Other symptoms and signs involving the nervous system: Secondary | ICD-10-CM | POA: Diagnosis not present

## 2018-08-30 HISTORY — PX: INCISIONAL HERNIA REPAIR: SHX193

## 2018-08-30 LAB — CBC
HCT: 43.2 % (ref 39.0–52.0)
Hemoglobin: 14.8 g/dL (ref 13.0–17.0)
MCH: 31.5 pg (ref 26.0–34.0)
MCHC: 34.3 g/dL (ref 30.0–36.0)
MCV: 91.9 fL (ref 80.0–100.0)
PLATELETS: 152 10*3/uL (ref 150–400)
RBC: 4.7 MIL/uL (ref 4.22–5.81)
RDW: 13.3 % (ref 11.5–15.5)
WBC: 7.3 10*3/uL (ref 4.0–10.5)
nRBC: 0 % (ref 0.0–0.2)

## 2018-08-30 LAB — CREATININE, SERUM
Creatinine, Ser: 1.41 mg/dL — ABNORMAL HIGH (ref 0.61–1.24)
GFR calc Af Amer: >60 mL/min (ref >60)
GFR calc non Af Amer: 55 mL/min — ABNORMAL LOW (ref >60)

## 2018-08-30 LAB — TSH: TSH: 2.33 u[IU]/mL (ref 0.350–4.500)

## 2018-08-30 LAB — TROPONIN I
Troponin I: <0.03 ng/mL (ref <0.03)
Troponin I: <0.03 ng/mL (ref <0.03)

## 2018-08-30 SURGERY — REPAIR, HERNIA, INCISIONAL
Anesthesia: General

## 2018-08-30 MED ORDER — LIDOCAINE HCL (PF) 2 % IJ SOLN
INTRAMUSCULAR | Status: AC
  Filled 2018-08-30: qty 10

## 2018-08-30 MED ORDER — PHENYLEPHRINE HCL 10 MG/ML IJ SOLN
INTRAMUSCULAR | Status: DC | PRN
  Administered 2018-08-30: 200 ug via INTRAVENOUS
  Administered 2018-08-30: 100 ug via INTRAVENOUS
  Administered 2018-08-30: 200 ug via INTRAVENOUS
  Administered 2018-08-30: 100 ug via INTRAVENOUS

## 2018-08-30 MED ORDER — DEXAMETHASONE SODIUM PHOSPHATE 10 MG/ML IJ SOLN
INTRAMUSCULAR | Status: AC
  Filled 2018-08-30: qty 1

## 2018-08-30 MED ORDER — CHLORHEXIDINE GLUCONATE CLOTH 2 % EX PADS
6.0000 | MEDICATED_PAD | Freq: Once | CUTANEOUS | Status: DC
  Administered 2018-08-30: 6 via TOPICAL

## 2018-08-30 MED ORDER — FAMOTIDINE 20 MG PO TABS
ORAL_TABLET | ORAL | Status: AC
  Administered 2018-08-30: 20 mg via ORAL
  Filled 2018-08-30: qty 1

## 2018-08-30 MED ORDER — ACETAMINOPHEN 500 MG PO TABS
1000.0000 mg | ORAL_TABLET | ORAL | Status: AC
  Administered 2018-08-30: 1000 mg via ORAL

## 2018-08-30 MED ORDER — HYDROCHLOROTHIAZIDE 25 MG PO TABS
25.0000 mg | ORAL_TABLET | Freq: Every day | ORAL | Status: DC
  Administered 2018-08-30 – 2018-08-31 (×2): 25 mg via ORAL
  Filled 2018-08-30 (×2): qty 1

## 2018-08-30 MED ORDER — MEPERIDINE HCL 50 MG/ML IJ SOLN: 6.2500 mg | INTRAMUSCULAR | Status: DC | PRN

## 2018-08-30 MED ORDER — KETOROLAC TROMETHAMINE 30 MG/ML IJ SOLN
INTRAMUSCULAR | Status: DC | PRN
  Administered 2018-08-30: 30 mg via INTRAVENOUS

## 2018-08-30 MED ORDER — LISINOPRIL-HYDROCHLOROTHIAZIDE 20-25 MG PO TABS: 1.0000 | ORAL_TABLET | Freq: Every day | ORAL | Status: DC

## 2018-08-30 MED ORDER — IBUPROFEN 600 MG PO TABS: 600.0000 mg | ORAL_TABLET | Freq: Three times a day (TID) | ORAL | 0 refills | Status: DC | PRN

## 2018-08-30 MED ORDER — ROCURONIUM BROMIDE 50 MG/5ML IV SOLN
INTRAVENOUS | Status: AC
  Filled 2018-08-30: qty 1

## 2018-08-30 MED ORDER — DEXAMETHASONE SODIUM PHOSPHATE 10 MG/ML IJ SOLN
INTRAMUSCULAR | Status: DC | PRN
  Administered 2018-08-30: 10 mg via INTRAVENOUS

## 2018-08-30 MED ORDER — OXYCODONE HCL 5 MG PO TABS: 5.0000 mg | ORAL_TABLET | Freq: Once | ORAL | Status: DC | PRN

## 2018-08-30 MED ORDER — BUPIVACAINE-EPINEPHRINE (PF) 0.25% -1:200000 IJ SOLN
INTRAMUSCULAR | Status: DC | PRN
  Administered 2018-08-30: 30 mL

## 2018-08-30 MED ORDER — GABAPENTIN 300 MG PO CAPS
ORAL_CAPSULE | ORAL | Status: AC
  Administered 2018-08-30: 300 mg via ORAL
  Filled 2018-08-30: qty 1

## 2018-08-30 MED ORDER — CEFAZOLIN SODIUM-DEXTROSE 2-4 GM/100ML-% IV SOLN
2.0000 g | INTRAVENOUS | Status: AC
  Administered 2018-08-30: 2 g via INTRAVENOUS

## 2018-08-30 MED ORDER — MIDAZOLAM HCL 2 MG/2ML IJ SOLN
INTRAMUSCULAR | Status: DC | PRN
  Administered 2018-08-30: 2 mg via INTRAVENOUS

## 2018-08-30 MED ORDER — OXYCODONE HCL 5 MG/5ML PO SOLN: 5.0000 mg | Freq: Once | ORAL | Status: DC | PRN

## 2018-08-30 MED ORDER — PHENYLEPHRINE HCL 10 MG/ML IJ SOLN
INTRAMUSCULAR | Status: AC
  Filled 2018-08-30: qty 1

## 2018-08-30 MED ORDER — HYDROCODONE-ACETAMINOPHEN 5-325 MG PO TABS
1.0000 | ORAL_TABLET | ORAL | Status: DC | PRN
  Administered 2018-08-30 – 2018-08-31 (×2): 1 via ORAL
  Filled 2018-08-30 (×2): qty 1

## 2018-08-30 MED ORDER — ONDANSETRON HCL 4 MG/2ML IJ SOLN
INTRAMUSCULAR | Status: AC
  Filled 2018-08-30: qty 2

## 2018-08-30 MED ORDER — ONDANSETRON HCL 4 MG/2ML IJ SOLN: 4.0000 mg | Freq: Four times a day (QID) | INTRAMUSCULAR | Status: DC | PRN

## 2018-08-30 MED ORDER — ACETAMINOPHEN 325 MG PO TABS
650.0000 mg | ORAL_TABLET | Freq: Four times a day (QID) | ORAL | Status: DC | PRN
  Administered 2018-08-30: 650 mg via ORAL
  Filled 2018-08-30: qty 2

## 2018-08-30 MED ORDER — NICOTINE 21 MG/24HR TD PT24: 21.0000 mg | MEDICATED_PATCH | Freq: Every day | TRANSDERMAL | Status: DC

## 2018-08-30 MED ORDER — FENTANYL CITRATE (PF) 100 MCG/2ML IJ SOLN
INTRAMUSCULAR | Status: DC | PRN
  Administered 2018-08-30: 50 ug via INTRAVENOUS
  Administered 2018-08-30: 100 ug via INTRAVENOUS

## 2018-08-30 MED ORDER — BUPROPION HCL ER (XL) 300 MG PO TB24
300.0000 mg | ORAL_TABLET | Freq: Every day | ORAL | Status: DC
  Administered 2018-08-31: 300 mg via ORAL
  Filled 2018-08-30: qty 1

## 2018-08-30 MED ORDER — LISINOPRIL 20 MG PO TABS
20.0000 mg | ORAL_TABLET | Freq: Every day | ORAL | Status: DC
  Administered 2018-08-30 – 2018-08-31 (×2): 20 mg via ORAL
  Filled 2018-08-30 (×2): qty 1

## 2018-08-30 MED ORDER — GABAPENTIN 300 MG PO CAPS
300.0000 mg | ORAL_CAPSULE | ORAL | Status: AC
  Administered 2018-08-30: 300 mg via ORAL

## 2018-08-30 MED ORDER — CHLORHEXIDINE GLUCONATE CLOTH 2 % EX PADS: 6.0000 | MEDICATED_PAD | Freq: Once | CUTANEOUS | Status: DC

## 2018-08-30 MED ORDER — PROPOFOL 10 MG/ML IV BOLUS
INTRAVENOUS | Status: DC | PRN
  Administered 2018-08-30: 200 mg via INTRAVENOUS

## 2018-08-30 MED ORDER — LIDOCAINE HCL (CARDIAC) PF 100 MG/5ML IV SOSY
PREFILLED_SYRINGE | INTRAVENOUS | Status: DC | PRN
  Administered 2018-08-30: 100 mg via INTRAVENOUS

## 2018-08-30 MED ORDER — ONDANSETRON HCL 4 MG PO TABS: 4.0000 mg | ORAL_TABLET | Freq: Four times a day (QID) | ORAL | Status: DC | PRN

## 2018-08-30 MED ORDER — LACTATED RINGERS IV SOLN
INTRAVENOUS | Status: DC
  Administered 2018-08-30 (×2): via INTRAVENOUS

## 2018-08-30 MED ORDER — ROCURONIUM BROMIDE 100 MG/10ML IV SOLN
INTRAVENOUS | Status: DC | PRN
  Administered 2018-08-30: 50 mg via INTRAVENOUS

## 2018-08-30 MED ORDER — AMLODIPINE BESYLATE 10 MG PO TABS
10.0000 mg | ORAL_TABLET | Freq: Every day | ORAL | Status: DC
  Administered 2018-08-31: 10 mg via ORAL
  Filled 2018-08-30 (×2): qty 1

## 2018-08-30 MED ORDER — FENTANYL CITRATE (PF) 250 MCG/5ML IJ SOLN
INTRAMUSCULAR | Status: AC
  Filled 2018-08-30: qty 5

## 2018-08-30 MED ORDER — PROPOFOL 10 MG/ML IV BOLUS
INTRAVENOUS | Status: AC
  Filled 2018-08-30: qty 20

## 2018-08-30 MED ORDER — FAMOTIDINE 20 MG PO TABS
20.0000 mg | ORAL_TABLET | Freq: Once | ORAL | Status: AC
  Administered 2018-08-30: 20 mg via ORAL

## 2018-08-30 MED ORDER — SENNOSIDES-DOCUSATE SODIUM 8.6-50 MG PO TABS
1.0000 | ORAL_TABLET | Freq: Every evening | ORAL | Status: DC | PRN
  Administered 2018-08-31: 1 via ORAL
  Filled 2018-08-30: qty 1

## 2018-08-30 MED ORDER — PROMETHAZINE HCL 25 MG/ML IJ SOLN: 6.2500 mg | INTRAMUSCULAR | Status: DC | PRN

## 2018-08-30 MED ORDER — SODIUM CHLORIDE 0.9 % IV SOLN
INTRAVENOUS | Status: DC
  Administered 2018-08-30 – 2018-08-31 (×2): via INTRAVENOUS

## 2018-08-30 MED ORDER — BUPIVACAINE-EPINEPHRINE (PF) 0.25% -1:200000 IJ SOLN
INTRAMUSCULAR | Status: AC
  Filled 2018-08-30: qty 30

## 2018-08-30 MED ORDER — EPHEDRINE SULFATE 50 MG/ML IJ SOLN
INTRAMUSCULAR | Status: DC | PRN
  Administered 2018-08-30: 15 mg via INTRAVENOUS

## 2018-08-30 MED ORDER — CEFAZOLIN SODIUM-DEXTROSE 2-4 GM/100ML-% IV SOLN
INTRAVENOUS | Status: AC
  Filled 2018-08-30: qty 100

## 2018-08-30 MED ORDER — NICOTINE 21 MG/24HR TD PT24
21.0000 mg | MEDICATED_PATCH | Freq: Every day | TRANSDERMAL | Status: DC
  Administered 2018-08-30 – 2018-08-31 (×2): 21 mg via TRANSDERMAL
  Filled 2018-08-30 (×2): qty 1

## 2018-08-30 MED ORDER — ROSUVASTATIN CALCIUM 10 MG PO TABS
10.0000 mg | ORAL_TABLET | Freq: Every day | ORAL | Status: DC
  Administered 2018-08-31: 10 mg via ORAL
  Filled 2018-08-30: qty 1

## 2018-08-30 MED ORDER — SUGAMMADEX SODIUM 200 MG/2ML IV SOLN
INTRAVENOUS | Status: DC | PRN
  Administered 2018-08-30: 200 mg via INTRAVENOUS

## 2018-08-30 MED ORDER — MIDAZOLAM HCL 2 MG/2ML IJ SOLN
INTRAMUSCULAR | Status: AC
  Filled 2018-08-30: qty 2

## 2018-08-30 MED ORDER — ONDANSETRON HCL 4 MG/2ML IJ SOLN
INTRAMUSCULAR | Status: DC | PRN
  Administered 2018-08-30: 4 mg via INTRAVENOUS

## 2018-08-30 MED ORDER — ENOXAPARIN SODIUM 40 MG/0.4ML ~~LOC~~ SOLN
40.0000 mg | SUBCUTANEOUS | Status: DC
  Administered 2018-08-31: 40 mg via SUBCUTANEOUS
  Filled 2018-08-30: qty 0.4

## 2018-08-30 MED ORDER — SUGAMMADEX SODIUM 200 MG/2ML IV SOLN
INTRAVENOUS | Status: AC
  Filled 2018-08-30: qty 2

## 2018-08-30 MED ORDER — ACETAMINOPHEN 650 MG RE SUPP: 650.0000 mg | Freq: Four times a day (QID) | RECTAL | Status: DC | PRN

## 2018-08-30 MED ORDER — OXYCODONE HCL 5 MG PO TABS: 5.0000 mg | ORAL_TABLET | ORAL | 0 refills | Status: DC | PRN

## 2018-08-30 MED ORDER — FLUOXETINE HCL 20 MG PO CAPS
20.0000 mg | ORAL_CAPSULE | Freq: Every day | ORAL | Status: DC
  Administered 2018-08-30 – 2018-08-31 (×2): 20 mg via ORAL
  Filled 2018-08-30 (×2): qty 1

## 2018-08-30 MED ORDER — FENTANYL CITRATE (PF) 100 MCG/2ML IJ SOLN: 25.0000 ug | INTRAMUSCULAR | Status: DC | PRN

## 2018-08-30 MED ORDER — KETOROLAC TROMETHAMINE 30 MG/ML IJ SOLN
INTRAMUSCULAR | Status: AC
  Filled 2018-08-30: qty 1

## 2018-08-30 MED ORDER — EPHEDRINE SULFATE 50 MG/ML IJ SOLN
INTRAMUSCULAR | Status: AC
  Filled 2018-08-30: qty 1

## 2018-08-30 MED ORDER — ACETAMINOPHEN 500 MG PO TABS
ORAL_TABLET | ORAL | Status: AC
  Administered 2018-08-30: 1000 mg via ORAL
  Filled 2018-08-30: qty 2

## 2018-08-30 SURGICAL SUPPLY — 26 items
CANISTER SUCT 1200ML W/VALVE (MISCELLANEOUS) ×2 IMPLANT
CHLORAPREP W/TINT 26ML (MISCELLANEOUS) ×2 IMPLANT
COVER WAND RF STERILE (DRAPES) ×2 IMPLANT
DERMABOND ADVANCED (GAUZE/BANDAGES/DRESSINGS) ×1
DERMABOND ADVANCED .7 DNX12 (GAUZE/BANDAGES/DRESSINGS) ×1 IMPLANT
DRAPE LAPAROTOMY 100X77 ABD (DRAPES) ×2 IMPLANT
ELECT CAUTERY BLADE 6.4 (BLADE) ×2 IMPLANT
ELECT REM PT RETURN 9FT ADLT (ELECTROSURGICAL) ×2
ELECTRODE REM PT RTRN 9FT ADLT (ELECTROSURGICAL) ×1 IMPLANT
GLOVE PROTEXIS LATEX SZ 7.5 (GLOVE) ×6 IMPLANT
GLOVE SURG SYN 7.0 (GLOVE) ×2 IMPLANT
GOWN STRL REUS W/ TWL LRG LVL3 (GOWN DISPOSABLE) ×2 IMPLANT
GOWN STRL REUS W/TWL LRG LVL3 (GOWN DISPOSABLE) ×2
NEEDLE HYPO 22GX1.5 SAFETY (NEEDLE) ×2 IMPLANT
NS IRRIG 500ML POUR BTL (IV SOLUTION) ×2 IMPLANT
PACK BASIN MINOR ARMC (MISCELLANEOUS) ×2 IMPLANT
SUT ETHIBOND 0 (SUTURE) ×2 IMPLANT
SUT MNCRL AB 4-0 PS2 18 (SUTURE) ×2 IMPLANT
SUT PROLENE 2 0 SH DA (SUTURE) ×2 IMPLANT
SUT SILK 0 (SUTURE) ×1
SUT SILK 0 30XBRD TIE 6 (SUTURE) ×1 IMPLANT
SUT SILK 0 SH 30 (SUTURE) ×2 IMPLANT
SUT VIC AB 0 SH 27 (SUTURE) ×2 IMPLANT
SUT VIC AB 3-0 SH 27 (SUTURE) ×1
SUT VIC AB 3-0 SH 27X BRD (SUTURE) ×1 IMPLANT
SYR 10ML LL (SYRINGE) ×2 IMPLANT

## 2018-08-30 NOTE — Transfer of Care (Signed)
Immediate Anesthesia Transfer of Care Note  Patient: Eric Lawrence  Procedure(s) Performed: HERNIA REPAIR INCISIONAL (N/A )  Patient Location: PACU  Anesthesia Type:General  Level of Consciousness: drowsy  Airway & Oxygen Therapy: Patient Spontanous Breathing and Patient connected to face mask oxygen  Post-op Assessment: Report given to RN and Post -op Vital signs reviewed and stable  Post vital signs: Reviewed and stable  Last Vitals:  Vitals Value Taken Time  BP 126/81 08/30/2018 10:22 AM  Temp    Pulse 68 08/30/2018 10:27 AM  Resp 17 08/30/2018 10:27 AM  SpO2 98 % 08/30/2018 10:27 AM  Vitals shown include unvalidated device data.  Last Pain:  Vitals:   08/30/18 0823  TempSrc: Tympanic  PainSc: 0-No pain      Patients Stated Pain Goal: 0 (08/30/18 8295)  Complications: No apparent anesthesia complications and bradyarrhythmia noted on emergence and shortly after arrival in PACU. Dr. Priscella Mann notified and rhythm strip obtained by RN. Dr. Priscella Mann to consult cardiology.

## 2018-08-30 NOTE — Op Note (Signed)
  Procedure Date:  08/30/2018  Pre-operative Diagnosis:  Epigastric incisional hernia  Post-operative Diagnosis:  Epigastric incisional hernia  Procedure:  Open incisional Hernia Repair with mesh  Surgeon:  Howie Ill, MD  Anesthesia:  General endotracheal  Estimated Blood Loss:  5 ml  Specimens:  none  Complications:  none  Indications for Procedure:  This is a 52 y.o. male who presents with a symptomatic epigastric incisional hernia.  The options of surgery versus observation were reviewed with the patient and/or family. The risks of bleeding, abscess or infection, recurrence of symptoms, injury to surrounding structures, and chronic pain were all discussed with the patient and was willing to proceed.  Description of Procedure: The patient was correctly identified in the preoperative area and brought into the operating room.  The patient was placed supine with VTE prophylaxis in place.  Appropriate time-outs were performed.  Anesthesia was induced and the patient was intubated.  Appropriate antibiotics were infused.  The upper abdomen was prepped and draped in a sterile fashion. The patient's hernia defect was marked with a marking pen.  The epigastric scar was excised and cautery was used to dissect down the subcutaneous tissue to the hernia defect.  It measured approximately 2 cm.  The fascial edges were cleared using cautery and then the hernia defect was closed using multiple 0 Ethibond sutures.  30 ml of local anesthetic was infused onto the fascia and skin.  The wound was then irrigated thoroughly and closed in two layers using 3-0 Vicryl and 4-0 Monocryl.  The incision was cleaned and sealed with DermaBond.  The patient was emerged from anesthesia and extubated and brought to the recovery room for further management.  The patient tolerated the procedure well and all counts were correct at the end of the case.   Howie Ill, MD

## 2018-08-30 NOTE — Interval H&P Note (Signed)
History and Physical Interval Note:  08/30/2018 8:12 AM  Eric Lawrence  has presented today for surgery, with the diagnosis of epigastric incisional hernia  The various methods of treatment have been discussed with the patient and family. After consideration of risks, benefits and other options for treatment, the patient has consented to  Procedure(s): HERNIA REPAIR INCISIONAL (N/A) as a surgical intervention .  The patient's history has been reviewed, patient examined, no change in status, stable for surgery.  I have reviewed the patient's chart and labs.  Questions were answered to the patient's satisfaction.     Ashlyn Cabler

## 2018-08-30 NOTE — Anesthesia Post-op Follow-up Note (Signed)
Anesthesia QCDR form completed.        

## 2018-08-30 NOTE — Anesthesia Preprocedure Evaluation (Signed)
Anesthesia Evaluation  Patient identified by MRN, date of birth, ID band Patient awake    Reviewed: Allergy & Precautions, NPO status , Patient's Chart, lab work & pertinent test results  History of Anesthesia Complications Negative for: history of anesthetic complications  Airway Mallampati: II  TM Distance: >3 FB Neck ROM: Full    Dental  (+) Edentulous Upper, Missing, Poor Dentition   Pulmonary sleep apnea , COPD,  COPD inhaler, Current Smoker,    breath sounds clear to auscultation- rhonchi (-) wheezing      Cardiovascular hypertension, (-) CAD, (-) Past MI, (-) Cardiac Stents and (-) CABG  Rhythm:Regular Rate:Normal - Systolic murmurs and - Diastolic murmurs    Neuro/Psych PSYCHIATRIC DISORDERS Anxiety Depression negative neurological ROS     GI/Hepatic Neg liver ROS, GERD  ,  Endo/Other  negative endocrine ROSneg diabetes  Renal/GU negative Renal ROS     Musculoskeletal  (+) Arthritis ,   Abdominal (+) + obese,   Peds  Hematology negative hematology ROS (+)   Anesthesia Other Findings Past Medical History: 06/29/2017: Acute cholecystitis 04/19/2015: Affective disorder, major 04/19/2015: Airway hyperreactivity No date: Anxiety 04/19/2015: Anxiety disorder 04/19/2015: Arthritis 04/19/2015: At risk for injury 04/19/2015: CAFL (chronic airflow limitation) (HCC) 06/29/2017: Cholecystitis 04/19/2015: Chronic gastritis 04/19/2015: Compulsive tobacco user syndrome No date: COPD (chronic obstructive pulmonary disease) (HCC) No date: Depression 04/19/2015: Encounter for immunization No date: GERD (gastroesophageal reflux disease) 04/19/2015: H/O elevated lipids No date: Hypertension 04/19/2015: Mechanical and motor problems with internal organs 09/10/2015: Obesity No date: Oxygen deficiency No date: Sleep apnea 04/15/2017: Trigger thumb of left hand   Reproductive/Obstetrics                              Anesthesia Physical Anesthesia Plan  ASA: II  Anesthesia Plan: General   Post-op Pain Management:    Induction: Intravenous  PONV Risk Score and Plan: 0 and Ondansetron  Airway Management Planned: Oral ETT  Additional Equipment:   Intra-op Plan:   Post-operative Plan: Extubation in OR  Informed Consent: I have reviewed the patients History and Physical, chart, labs and discussed the procedure including the risks, benefits and alternatives for the proposed anesthesia with the patient or authorized representative who has indicated his/her understanding and acceptance.   Dental advisory given  Plan Discussed with: CRNA and Anesthesiologist  Anesthesia Plan Comments:         Anesthesia Quick Evaluation

## 2018-08-30 NOTE — OR Nursing (Signed)
Patient has been resting with eyes closed, no complaints of pain tolerated drink and crackers well, and is hungry, order patient food awaiting a bed.  Patient has not dropped his heart rate in several hours but is having some frequent PVCS

## 2018-08-30 NOTE — Consult Note (Signed)
Eric Lawrence is a 52 y.o. male  [id number]  Primary Cardiologist: Adrian Blackwater Reason for Consultation: A-V dissociation after surgery  HPI: This is a 52 year old white male with a past medical history of incisional hernia which required repair and postoperatively patient developed A-V dissociation with a heart rate of 30 and currently 72.  Patient felt dizzy during postoperative.  And I was asked to evaluate the patient.  Patient states prior to surgery he had occasional chest pain associated with shortness of breath.  No chest pain at this time no dizziness no palpitation.   Review of Systems: No orthopnea PND or leg swelling   Past Medical History:  Diagnosis Date  . Acute cholecystitis 06/29/2017  . 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 Airway hyperreactivity 04/19/2015  . Anxiety   . Anxiety disorder 04/19/2015  . Arthritis 04/19/2015  . At risk for injury 04/19/2015  . CAFL (chronic airflow limitation) (HCC) 04/19/2015  . Cholecystitis 06/29/2017  . Chronic gastritis 04/19/2015  . Compulsive tobacco user syndrome 04/19/2015  . COPD (chronic obstructive pulmonary disease) (HCC)   . Depression   . Encounter for immunization 04/19/2015  . GERD (gastroesophageal reflux disease)   . H/O elevated lipids 04/19/2015  . Hypertension   . Mechanical and motor problems with internal organs 04/19/2015  . Obesity 09/10/2015  . Oxygen deficiency   . Sleep apnea   . Trigger thumb of left hand 04/15/2017    Medications Prior to Admission  Medication Sig Dispense Refill  . Acetaminophen (TYLENOL) 325 MG CAPS Take 325 mg by mouth daily.     Marland Kitchen albuterol (PROVENTIL HFA;VENTOLIN HFA) 108 (90 Base) MCG/ACT inhaler Inhale 2 puffs Lawrence 4 (four) hours as needed into the lungs for wheezing or shortness of breath. 1 Inhaler 3  . amLODipine (NORVASC) 10 MG tablet TAKE ONE TABLET BY MOUTH Lawrence DAY (Patient taking differently: Take 10 mg by mouth daily. ) 30 tablet 0  . buPROPion (WELLBUTRIN XL) 300 MG  24 hr tablet Take 300 mg by mouth daily.     Marland Kitchen FLUoxetine (PROZAC) 20 MG capsule Take 20 mg by mouth daily as needed.     Marland Kitchen lisinopril-hydrochlorothiazide (PRINZIDE,ZESTORETIC) 20-25 MG tablet Take 1 tablet by mouth daily.    . Multiple Vitamin (MULTIVITAMIN WITH MINERALS) TABS tablet Take 1 tablet by mouth daily.    . mupirocin ointment (BACTROBAN) 2 % Place 1 application into the nose daily as needed (sores).    . nicotine (NICODERM CQ - DOSED IN MG/24 HOURS) 21 mg/24hr patch Place 21 mg onto the skin daily.   3  . OXYGEN Inhale 2 L into the lungs daily as needed (nightly).    . rosuvastatin (CRESTOR) 10 MG tablet Take 1 tablet (10 mg total) by mouth daily. 90 tablet 3  . cephALEXin (KEFLEX) 500 MG capsule Take 1 capsule (500 mg total) by mouth 4 (four) times daily. (Patient not taking: Reported on 08/23/2018) 40 capsule 0  . lisinopril-hydrochlorothiazide (ZESTORETIC) 20-12.5 MG tablet Take 1 tablet by mouth daily. (Patient not taking: Reported on 08/23/2018) 90 tablet 0  . sulfamethoxazole-trimethoprim (BACTRIM DS,SEPTRA DS) 800-160 MG tablet Take 2 tablets by mouth 2 (two) times daily. (Patient not taking: Reported on 08/23/2018) 40 tablet 0     . Chlorhexidine Gluconate Cloth  6 each Topical Once   And  . Chlorhexidine Gluconate Cloth  6 each Topical Once    Infusions: . lactated ringers 75 mL/hr at 08/30/18 0830    No Known  Allergies  Social History   Socioeconomic History  . Marital status: Single    Spouse name: Not on file  . Number of children: Not on file  . Years of education: Not on file  . Highest education level: Not on file  Occupational History  . Not on file  Social Needs  . Financial resource strain: Not on file  . Food insecurity:    Worry: Not on file    Inability: Not on file  . Transportation needs:    Medical: Not on file    Non-medical: Not on file  Tobacco Use  . Smoking status: Current Lawrence Day Smoker    Packs/day: 0.25    Types: Cigarettes   . Smokeless tobacco: Never Used  Substance and Sexual Activity  . Alcohol use: No    Alcohol/week: 0.0 standard drinks  . Drug use: No  . Sexual activity: Not on file  Lifestyle  . Physical activity:    Days per week: Not on file    Minutes per session: Not on file  . Stress: Not on file  Relationships  . Social connections:    Talks on phone: Not on file    Gets together: Not on file    Attends religious service: Not on file    Active member of club or organization: Not on file    Attends meetings of clubs or organizations: Not on file    Relationship status: Not on file  . Intimate partner violence:    Fear of current or ex partner: Not on file    Emotionally abused: Not on file    Physically abused: Not on file    Forced sexual activity: Not on file  Other Topics Concern  . Not on file  Social History Narrative  . Not on file    Family History  Problem Relation Age of Onset  . Stroke Mother 94  . Diabetes Mother   . Kidney disease Mother   . Hypertension Mother   . COPD Maternal Aunt   . Diabetes Maternal Grandmother   . Heart disease Sister     PHYSICAL EXAM: Vitals:   08/30/18 1023 08/30/18 1052  BP: 126/81 121/77  Pulse: (!) 39 68  Resp: 17 14  Temp: (!) 97.1 F (36.2 C)   SpO2: 98% 93%     Intake/Output Summary (Last 24 hours) at 08/30/2018 1135 Last data filed at 08/30/2018 1019 Gross per 24 hour  Intake 1000 ml  Output 10 ml  Net 990 ml    General:  Well appearing. No respiratory difficulty HEENT: normal Neck: supple. no JVD. Carotids 2+ bilat; no bruits. No lymphadenopathy or thryomegaly appreciated. Cor: PMI nondisplaced. Regular rate & rhythm. No rubs, gallops or murmurs. Lungs: clear Abdomen: soft, nontender, nondistended. No hepatosplenomegaly. No bruits or masses. Good bowel sounds. Extremities: no cyanosis, clubbing, rash, edema Neuro: alert & oriented x 3, cranial nerves grossly intact. moves all 4 extremities w/o difficulty.  Affect pleasant.  ECG: Normal sinus rhythm about 70 bpm nonspecific ST-T changes rhythm strip seen during postoperative.  Show A-V dissociation with a heart rate around 30-40.  F2  No results found for this or any previous visit (from the past 24 hour(s)). No results found.   ASSESSMENT AND PLAN: A-V dissociation postoperatively after incisional hernia surgery.  Currently patient is in normal sinus rhythm about 70 bpm.  Advise placing pads on patient without actual need for external pacemaker availability.  Advise observation overnight.  Advise ruling out  MI.  Advise hospitalist admitting the patient for overnight and observation.  Thank you very much for referral.  Shuna Tabor A

## 2018-08-30 NOTE — H&P (Signed)
[facility] - [place] at [facility]   PATIENT NAME: Eric Lawrence    MR#:  [id number]  DATE OF BIRTH:  May 22, 1966  DATE OF ADMISSION:  08/30/2018  PRIMARY CARE PHYSICIAN: Trey Sailors, PA-C   REQUESTING/REFERRING PHYSICIAN: dr Aleen Campi  CHIEF COMPLAINT:    abnormal heart rhythm HISTORY OF PRESENT ILLNESS:  Eric Lawrence  is a 52 y.o. male with a known history of COPD, tobacco dependence recently quit smoking who presented for elective surgery for incisional hernia repair.  Intraoperatively and postoperatively patient had A-V dissociation with a heart rate of 30.  He was given norepinephrine while in the operating room.  He was eval by cardiology postoperative.  I spoke with Dr. Lennette Bihari who feels patient had A-V dissociation.  Patient also states over the past several weeks he has had on and off dizziness but no chest pain.  He denies palpitations.  Today patient was asymptomatic.  His heart rate is currently normal sinus rhythm with a heart rate of 72.   PAST MEDICAL HISTORY:   Past Medical History:  Diagnosis Date  . Acute cholecystitis 06/29/2017  . 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 Airway hyperreactivity 04/19/2015  . Anxiety   . Anxiety disorder 04/19/2015  . Arthritis 04/19/2015  . At risk for injury 04/19/2015  . CAFL (chronic airflow limitation) (HCC) 04/19/2015  . Cholecystitis 06/29/2017  . Chronic gastritis 04/19/2015  . Compulsive tobacco user syndrome 04/19/2015  . COPD (chronic obstructive pulmonary disease) (HCC)   . Depression   . Encounter for immunization 04/19/2015  . GERD (gastroesophageal reflux disease)   . H/O elevated lipids 04/19/2015  . Hypertension   . Mechanical and motor problems with internal organs 04/19/2015  . Obesity 09/10/2015  . Oxygen deficiency   . Sleep apnea   . Trigger thumb of left hand 04/15/2017    PAST SURGICAL HISTORY:   Past Surgical History:  Procedure Laterality Date  . CHOLECYSTECTOMY N/A 08/06/2017    Procedure: LAPAROSCOPIC CHOLECYSTECTOMY;  Surgeon: Lattie Haw, MD;  Location: [facility] ORS;  Service: General;  Laterality: N/A;  . cyst removal from wrist    . UPPER GASTROINTESTINAL ENDOSCOPY      SOCIAL HISTORY:   Social History   Tobacco Use  . Smoking status: Current Every Day Smoker    Packs/day: 0.25    Types: Cigarettes  . Smokeless tobacco: Never Used  Substance Use Topics  . Alcohol use: No    Alcohol/week: 0.0 standard drinks    FAMILY HISTORY:   Family History  Problem Relation Age of Onset  . Stroke Mother 52  . Diabetes Mother   . Kidney disease Mother   . Hypertension Mother   . COPD Maternal Aunt   . Diabetes Maternal Grandmother   . Heart disease Sister     DRUG ALLERGIES:  No Known Allergies  REVIEW OF SYSTEMS:   Review of Systems  Constitutional: Negative.  Negative for chills, fever and malaise/fatigue.  HENT: Negative.  Negative for ear discharge, ear pain, hearing loss, nosebleeds and sore throat.   Eyes: Negative.  Negative for blurred vision and pain.  Respiratory: Negative.  Negative for cough, hemoptysis, shortness of breath and wheezing.   Cardiovascular: Negative.  Negative for chest pain, palpitations and leg swelling.  Gastrointestinal: Negative.  Negative for abdominal pain, blood in stool, diarrhea, nausea and vomiting.  Genitourinary: Negative.  Negative for dysuria.  Musculoskeletal: Negative.  Negative for back pain.  Skin: Negative.   Neurological: Positive for dizziness.  Negative for tremors, speech change, focal weakness, seizures and headaches.  Endo/Heme/Allergies: Negative.  Does not bruise/bleed easily.  Psychiatric/Behavioral: Negative.  Negative for depression, hallucinations and suicidal ideas.    MEDICATIONS AT HOME:   Prior to Admission medications   Medication Sig Start Date End Date Taking? Authorizing Provider  Acetaminophen (TYLENOL) 325 MG CAPS Take 325 mg by mouth daily.    Yes [provider]   albuterol (PROVENTIL HFA;VENTOLIN HFA) 108 (90 Base) MCG/ACT inhaler Inhale 2 puffs every 4 (four) hours as needed into the lungs for wheezing or shortness of breath. 09/08/17  Yes Karamalegos, Alexander J, DO  amLODipine (NORVASC) 10 MG tablet TAKE ONE TABLET BY MOUTH EVERY DAY Patient taking differently: Take 10 mg by mouth daily.  06/11/18  Yes Karamalegos, Netta Neat, DO  buPROPion (WELLBUTRIN XL) 300 MG 24 hr tablet Take 300 mg by mouth daily.  12/09/17  Yes [provider]  FLUoxetine (PROZAC) 20 MG capsule Take 20 mg by mouth daily as needed.    Yes [provider]  lisinopril-hydrochlorothiazide (PRINZIDE,ZESTORETIC) 20-25 MG tablet Take 1 tablet by mouth daily.   Yes [provider]  Multiple Vitamin (MULTIVITAMIN WITH MINERALS) TABS tablet Take 1 tablet by mouth daily.   Yes [provider]  mupirocin ointment (BACTROBAN) 2 % Place 1 application into the nose daily as needed (sores).   Yes [provider]  nicotine (NICODERM CQ - DOSED IN MG/24 HOURS) 21 mg/24hr patch Place 21 mg onto the skin daily.  02/25/18  Yes [provider]  OXYGEN Inhale 2 L into the lungs daily as needed (nightly).   Yes [provider]  rosuvastatin (CRESTOR) 10 MG tablet Take 1 tablet (10 mg total) by mouth daily. 03/15/18  Yes Karamalegos, Netta Neat, DO  cephALEXin (KEFLEX) 500 MG capsule Take 1 capsule (500 mg total) by mouth 4 (four) times daily. Patient not taking: Reported on 08/23/2018 08/16/18   Loleta Rose, MD  ibuprofen (ADVIL,MOTRIN) 600 MG tablet Take 1 tablet (600 mg total) by mouth every 8 (eight) hours as needed for mild pain or moderate pain. 08/30/18   Henrene Dodge, MD  lisinopril-hydrochlorothiazide (ZESTORETIC) 20-12.5 MG tablet Take 1 tablet by mouth daily. Patient not taking: Reported on 08/23/2018 08/05/18   Trey Sailors, PA-C  oxyCODONE (OXY IR/ROXICODONE) 5 MG immediate release tablet Take 1 tablet (5 mg total) by mouth  every 4 (four) hours as needed for severe pain. 08/30/18   Henrene Dodge, MD  sulfamethoxazole-trimethoprim (BACTRIM DS,SEPTRA DS) 800-160 MG tablet Take 2 tablets by mouth 2 (two) times daily. Patient not taking: Reported on 08/23/2018 08/16/18   Loleta Rose, MD      VITAL SIGNS:  Blood pressure 133/81, pulse 70, temperature (!) 97.1 F (36.2 C), resp. rate 15, height 6\' 2"  (1.88 m), weight 119.6 kg, SpO2 92 %.  PHYSICAL EXAMINATION:   Physical Exam  Constitutional: He is oriented to person, place, and time. No distress.  HENT:  Head: Normocephalic.  Eyes: No scleral icterus.  Neck: Normal range of motion. Neck supple. No JVD present. No tracheal deviation present.  Cardiovascular: Normal rate, regular rhythm and normal heart sounds. Exam reveals no gallop and no friction rub.  No murmur heard. Pulmonary/Chest: Effort normal and breath sounds normal. No respiratory distress. He has no wheezes. He has no rales. He exhibits no tenderness.  Abdominal: Soft. Bowel sounds are normal. He exhibits no distension and no mass. There is no tenderness. There is no rebound and no  guarding.  Musculoskeletal: Normal range of motion. He exhibits no edema.  Neurological: He is alert and oriented to person, place, and time.  Skin: Skin is warm. No rash noted. No erythema.  Psychiatric: Judgment normal.      LABORATORY PANEL:   CBC Recent Labs  Lab 08/30/18 1158  WBC 7.3  HGB 14.8  HCT 43.2  PLT 152   ------------------------------------------------------------------------------------------------------------------  Chemistries  No results for input(s): NA, K, CL, CO2, GLUCOSE, BUN, CREATININE, CALCIUM, MG, AST, ALT, ALKPHOS, BILITOT in the last 168 hours.  Invalid input(s): GFRCGP ------------------------------------------------------------------------------------------------------------------  Cardiac Enzymes No results for input(s): TROPONINI in the last 168  hours. ------------------------------------------------------------------------------------------------------------------  RADIOLOGY:  No results found.  EKG:   Normal sinus rhythm heart rate 70 Rhythm strip shows A-V dissociation with heart rate 30 IMPRESSION AND PLAN:   52 year old male with history of COPD who presented for elective incisional hernia surgery and subsequently intraoperatively/postoperatively had abnormal cardiac rhythm.  1.  A-V dissociation: Patient eval by cardiology Continue telemetry monitoring Follow troponins x3 Echocardiogram Further management as per cardiology  2.  Dizziness: This may be related to above issues Patient will likely need 30-day Holter monitor upon discharge I will order carotid Doppler to evaluate for carotid pathology  3.Tobacco dependence: Patient is encouraged to quit smoking. Counseling was provided for 4 minutes. Nicotine patch  4.  COPD without signs of exacerbation  5.  Essential hypertension: Patient will continue lisinopril and HCTZ and Norvasc  6.  Depression: Continue Wellbutrin and Prozac 7.  Elective hernia repair: Management as per surgery   All the records are reviewed and case discussed with Dr. Welton Flakes   management plans discussed with the patient and he is in agreement  CODE STATUS: Full  TOTAL TIME TAKING CARE OF THIS PATIENT: 48 minutes.    Alishea Beaudin M.D on 08/30/2018 at 12:13 PM  Between 7am to 6pm - Pager - [phone]  After 6pm go to [email or web address] - Social research officer, government  [facility]  Office  [phone]  CC: Primary care physician; Trey Sailors, PA-C

## 2018-08-30 NOTE — Anesthesia Postprocedure Evaluation (Signed)
Anesthesia Post Note  Patient: Eric Lawrence  Procedure(s) Performed: HERNIA REPAIR INCISIONAL (N/A )  Patient location during evaluation: PACU Anesthesia Type: General Level of consciousness: awake and alert and oriented Pain management: pain level controlled Vital Signs Assessment: post-procedure vital signs reviewed and stable Respiratory status: spontaneous breathing, nonlabored ventilation and respiratory function stable Cardiovascular status: blood pressure returned to baseline and stable Postop Assessment: no signs of nausea or vomiting Anesthetic complications: no     Last Vitals:  Vitals:   08/30/18 1207 08/30/18 1222  BP: 132/85 138/85  Pulse: 67 72  Resp: 14 17  Temp:    SpO2: 97% 97%    Last Pain:  Vitals:   08/30/18 1222  TempSrc:   PainSc: 0-No pain                 Yuuki Skeens

## 2018-08-30 NOTE — Anesthesia Procedure Notes (Signed)
Procedure Name: Intubation Date/Time: 08/30/2018 9:04 AM Performed by: Eben Burow, CRNA Pre-anesthesia Checklist: Patient identified, Emergency Drugs available, Suction available, Patient being monitored and Timeout performed Patient Re-evaluated:Patient Re-evaluated prior to induction Oxygen Delivery Method: Circle system utilized Preoxygenation: Pre-oxygenation with 100% oxygen Induction Type: IV induction Ventilation: Mask ventilation without difficulty and Oral airway inserted - appropriate to patient size Laryngoscope Size: Sabra Heck and 2 Grade View: Grade I Tube type: Oral Tube size: 8.0 mm Number of attempts: 1 Airway Equipment and Method: Stylet and LTA kit utilized Placement Confirmation: ETT inserted through vocal cords under direct vision,  breath sounds checked- equal and bilateral and positive ETCO2 Secured at: 24 cm Tube secured with: Tape Dental Injury: Teeth and Oropharynx as per pre-operative assessment

## 2018-08-31 ENCOUNTER — Other Ambulatory Visit: Payer: Self-pay | Admitting: Family Medicine

## 2018-08-31 ENCOUNTER — Encounter: Payer: Self-pay | Admitting: Surgery

## 2018-08-31 ENCOUNTER — Inpatient Hospital Stay
Admission: RE | Admit: 2018-08-31 | Discharge: 2018-08-31 | Disposition: A | Discharge status: Home / Self Care | Payer: Medicare Other | Source: Ambulatory Visit | Attending: Internal Medicine | Admitting: Internal Medicine

## 2018-08-31 DIAGNOSIS — K219 Gastro-esophageal reflux disease without esophagitis: Secondary | ICD-10-CM | POA: Diagnosis not present

## 2018-08-31 DIAGNOSIS — G4730 Sleep apnea, unspecified: Secondary | ICD-10-CM | POA: Diagnosis not present

## 2018-08-31 DIAGNOSIS — I4589 Other specified conduction disorders: Secondary | ICD-10-CM | POA: Diagnosis not present

## 2018-08-31 DIAGNOSIS — I442 Atrioventricular block, complete: Secondary | ICD-10-CM | POA: Diagnosis not present

## 2018-08-31 DIAGNOSIS — K432 Incisional hernia without obstruction or gangrene: Secondary | ICD-10-CM | POA: Diagnosis not present

## 2018-08-31 DIAGNOSIS — I10 Essential (primary) hypertension: Secondary | ICD-10-CM | POA: Diagnosis not present

## 2018-08-31 DIAGNOSIS — Z79899 Other long term (current) drug therapy: Secondary | ICD-10-CM | POA: Diagnosis not present

## 2018-08-31 DIAGNOSIS — N281 Cyst of kidney, acquired: Secondary | ICD-10-CM | POA: Diagnosis not present

## 2018-08-31 DIAGNOSIS — M48061 Spinal stenosis, lumbar region without neurogenic claudication: Secondary | ICD-10-CM | POA: Diagnosis not present

## 2018-08-31 DIAGNOSIS — J449 Chronic obstructive pulmonary disease, unspecified: Secondary | ICD-10-CM | POA: Diagnosis not present

## 2018-08-31 DIAGNOSIS — E782 Mixed hyperlipidemia: Secondary | ICD-10-CM

## 2018-08-31 LAB — CBC
HCT: 44.6 % (ref 39.0–52.0)
Hemoglobin: 15.3 g/dL (ref 13.0–17.0)
MCH: 31.2 pg (ref 26.0–34.0)
MCHC: 34.3 g/dL (ref 30.0–36.0)
MCV: 91 fL (ref 80.0–100.0)
NRBC: 0 % (ref 0.0–0.2)
Platelets: 190 10*3/uL (ref 150–400)
RBC: 4.9 MIL/uL (ref 4.22–5.81)
RDW: 13.1 % (ref 11.5–15.5)
WBC: 16.7 10*3/uL — ABNORMAL HIGH (ref 4.0–10.5)

## 2018-08-31 LAB — ECHOCARDIOGRAM COMPLETE
Height: 74 in
WEIGHTICAEL: 4218.72 [oz_av]

## 2018-08-31 LAB — BASIC METABOLIC PANEL
Anion gap: 8 (ref 5–15)
BUN: 24 mg/dL — ABNORMAL HIGH (ref 6–20)
CHLORIDE: 105 mmol/L (ref 98–111)
CO2: 28 mmol/L (ref 22–32)
Calcium: 8.6 mg/dL — ABNORMAL LOW (ref 8.9–10.3)
Creatinine, Ser: 1.27 mg/dL — ABNORMAL HIGH (ref 0.61–1.24)
GFR calc Af Amer: >60 mL/min (ref >60)
GFR calc non Af Amer: >60 mL/min (ref >60)
GLUCOSE: 164 mg/dL — AB (ref 70–99)
Potassium: 3.9 mmol/L (ref 3.5–5.1)
Sodium: 141 mmol/L (ref 135–145)

## 2018-08-31 LAB — TROPONIN I

## 2018-08-31 MED ORDER — LEVOFLOXACIN 500 MG PO TABS: 500.0000 mg | ORAL_TABLET | Freq: Every day | ORAL | 0 refills | Status: AC

## 2018-08-31 MED ORDER — OXYCODONE HCL 5 MG PO TABS: 5.0000 mg | ORAL_TABLET | ORAL | 0 refills | Status: DC | PRN

## 2018-08-31 NOTE — Progress Notes (Signed)
Patient discharged this afternoon home with room mate. Prescriptions provided and medication regimen information for home provided to patient with understanding and teach back. Patient also educated on surgical incision site care and follow up appointments. VSS, no complaints or concerns.

## 2018-08-31 NOTE — Progress Notes (Signed)
SUBJECTIVE: Patient is feeling much better   Vitals:   08/30/18 1525 08/30/18 2025 08/31/18 0450 08/31/18 0452  BP: 130/74 134/75 (!) 161/93 (!) 148/74  Pulse: 84 97 82 76  Resp:      Temp:  98.5 F (36.9 C) 97.7 F (36.5 C)   TempSrc:  Oral Oral   SpO2: 97% 96% 96% 96%  Weight:      Height:        Intake/Output Summary (Last 24 hours) at 08/31/2018 0723 Last data filed at 08/31/2018 0500 Gross per 24 hour  Intake 2275.54 ml  Output 1260 ml  Net 1015.54 ml    LABS: Basic Metabolic Panel: Recent Labs    08/30/18 1158 08/31/18 0527  NA  --  141  K  --  3.9  CL  --  105  CO2  --  28  GLUCOSE  --  164*  BUN  --  24*  CREATININE 1.41* 1.27*  CALCIUM  --  8.6*   Liver Function Tests: No results for input(s): AST, ALT, ALKPHOS, BILITOT, PROT, ALBUMIN in the last 72 hours. No results for input(s): LIPASE, AMYLASE in the last 72 hours. CBC: Recent Labs    08/30/18 1158 08/31/18 0527  WBC 7.3 16.7*  HGB 14.8 15.3  HCT 43.2 44.6  MCV 91.9 91.0  PLT 152 190   Cardiac Enzymes: Recent Labs    08/30/18 1158 08/30/18 1757 08/30/18 2329  TROPONINI <0.03 <0.03 <0.03   BNP: Invalid input(s): POCBNP D-Dimer: No results for input(s): DDIMER in the last 72 hours. Hemoglobin A1C: No results for input(s): HGBA1C in the last 72 hours. Fasting Lipid Panel: No results for input(s): CHOL, HDL, LDLCALC, TRIG, CHOLHDL, LDLDIRECT in the last 72 hours. Thyroid Function Tests: Recent Labs    08/30/18 1158  TSH 2.330   Anemia Panel: No results for input(s): VITAMINB12, FOLATE, FERRITIN, TIBC, IRON, RETICCTPCT in the last 72 hours.   PHYSICAL EXAM General: Well developed, well nourished, in no acute distress HEENT:  Normocephalic and atramatic Neck:  No JVD.  Lungs: Clear bilaterally to auscultation and percussion. Heart: HRRR . Normal S1 and S2 without gallops or murmurs.  Abdomen: Bowel sounds are positive, abdomen soft and non-tender  Msk:  Back normal, normal  gait. Normal strength and tone for age. Extremities: No clubbing, cyanosis or edema.   Neuro: Alert and oriented X 3. Psych:  Good affect, responds appropriately  TELEMETRY: Normal sinus rhythm  ASSESSMENT AND PLAN: Status post A-V dissociation with ventricular rate of 430 after incisional hernia surgery.  Currently heart rate been running in 7090 and patient is feeling much better denies any chest pain shortness of breath or dizziness.  Patient can be discharged home with follow-up in the office Monday at 10 AM.  Active Problems:   Incisional hernia, without obstruction or gangrene   AV dissociation    Myrle Dues A, MD, [facility] 08/31/2018 7:23 AM

## 2018-08-31 NOTE — Plan of Care (Signed)
  Problem: Clinical Measurements: Goal: Ability to maintain clinical measurements within normal limits will improve Outcome: Progressing Goal: Cardiovascular complication will be avoided Outcome: Progressing   Problem: Activity: Goal: Risk for activity intolerance will decrease Outcome: Progressing   Problem: Pain Managment: Goal: General experience of comfort will improve Outcome: Progressing   

## 2018-08-31 NOTE — Progress Notes (Signed)
*  PRELIMINARY RESULTS* Echocardiogram 2D Echocardiogram has been performed.  Joanette Gula Matie Dimaano 08/31/2018, 11:50 AM

## 2018-08-31 NOTE — Plan of Care (Signed)
Pt. continues to be stable on the monitor. Surgical site remains clean, dry, and intact.  No other issues noted. Will continue to monitor.

## 2018-08-31 NOTE — Care Management Important Message (Deleted)
Important Message  Patient Details  Name: Eric Lawrence MRN: [id number] Date of Birth: 16-Oct-1966   Medicare Important Message Given:  Yes    Sherren Kerns, RN 08/31/2018, 1:38 PM

## 2018-08-31 NOTE — Care Management Obs Status (Signed)
MEDICARE OBSERVATION STATUS NOTIFICATION   Patient Details  Name: Eric Lawrence MRN: [id number] Date of Birth: [birth year]   Medicare Observation Status Notification Given:  Yes    Sherren Kerns, RN 08/31/2018, 1:39 PM

## 2018-08-31 NOTE — Care Management CC44 (Signed)
Condition Code 44 Documentation Completed  Patient Details  Name: WINFREY CHILLEMI MRN: [id number] Date of Birth: [birth year]   Condition Code 44 given:  Yes Patient signature on Condition Code 44 notice:  Yes Documentation of 2 MD's agreement:  Yes Code 44 added to claim:  Yes    Sherren Kerns, RN 08/31/2018, 1:39 PM

## 2018-08-31 NOTE — Care Management Important Message (Deleted)
Important Message  Patient Details  Name: Eric Lawrence MRN: [id number] Date of Birth: May 15, 1966   Medicare Important Message Given:  Yes    Sherren Kerns, RN 08/31/2018, 1:39 PM

## 2018-08-31 NOTE — Discharge Summary (Addendum)
Eric Lawrence, is a 52 y.o. male  DOB February 11, 1966  MRN [id number].  Admission date:  08/30/2018  Admitting Physician  Henrene Dodge, MD  Discharge Date:  08/31/2018   Primary MD  Trey Sailors, PA-C  Recommendations for primary care physician for things to follow:   Follow with PCP in 1 week Follow-up with Dr. Wynelle Link: Monday , Nov 4th  10 Am..  Admission Diagnosis  epigastric incisional hernia   Discharge Diagnosis  epigastric incisional hernia   Active Problems:   Incisional hernia, without obstruction or gangrene   AV dissociation      Past Medical History:  Diagnosis Date  . Acute cholecystitis 06/29/2017  . 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 Airway hyperreactivity 04/19/2015  . Anxiety   . Anxiety disorder 04/19/2015  . Arthritis 04/19/2015  . At risk for injury 04/19/2015  . CAFL (chronic airflow limitation) (HCC) 04/19/2015  . Cholecystitis 06/29/2017  . Chronic gastritis 04/19/2015  . Compulsive tobacco user syndrome 04/19/2015  . COPD (chronic obstructive pulmonary disease) (HCC)   . Depression   . Encounter for immunization 04/19/2015  . GERD (gastroesophageal reflux disease)   . H/O elevated lipids 04/19/2015  . Hypertension   . Mechanical and motor problems with internal organs 04/19/2015  . Obesity 09/10/2015  . Oxygen deficiency   . Sleep apnea   . Trigger thumb of left hand 04/15/2017    Past Surgical History:  Procedure Laterality Date  . CHOLECYSTECTOMY N/A 08/06/2017   Procedure: LAPAROSCOPIC CHOLECYSTECTOMY;  Surgeon: Lattie Haw, MD;  Location: [facility] ORS;  Service: General;  Laterality: N/A;  . cyst removal from wrist    . INCISIONAL HERNIA REPAIR N/A 08/30/2018   Procedure: HERNIA REPAIR INCISIONAL;  Surgeon: Henrene Dodge, MD;  Location: [facility] ORS;  Service: General;  Laterality:  N/A;  . UPPER GASTROINTESTINAL ENDOSCOPY         History of present illness and  Hospital Course:     Kindly see H&P for history of present illness and admission details, please review complete Labs, Consult reports and Test reports for all details in brief  HPI  from the history and physical done on the day of admission 52 year old patient with history of COPD, tobacco abuse admitted to medical service because of bradycardia after surgery patient had epigastric incisional hernia repaired by Dr. Aleen Campi and supposed to be discharged but developed bradycardia with heart rate 30 bpm, seen by cardiology Dr. Adrian Blackwater, hospitalist admitted to telemetry for A-V dissociation   Hospital Course  1 A-V dissociation: Postoperatively after incisional hernia.  Patient had a incisional hernia repaired by Dr. Aleen Campi.  She then developed A-V dissociation with heart rate 30 bpm postoperatively.  He also felt dizzy.  Given IV fluids, no further dizziness now.  Seen by cardiology Dr. Adrian Blackwater, troponins have been negative x3.  Has been in sinus rhythm since then with heart rate around 70 bpm.  Patient is cleared to go home by Dr. Adrian Blackwater and see him in the office on November 4 at 10 AM.  Patient feels well to go home and denies any chest pain. #2,.  GERD: Continue PPIs 3.acute  Bronchitis; discharged home with Levaquin for 5 days.  The patient to quit smoking.  Patient is on albuterol inhaler at home and nicotine patch.   Discharge Condition: stable   Follow UP  Follow-up Information    Henrene Dodge, MD In 2 weeks.   Specialty:  General Surgery Why:  Appointment  Time: @ office will call patient with appt. Contact information: [address] [place] [address] [phone]        Trey Sailors, [place]. Schedule an appointment as soon as possible for a visit in 1 week(s).   Specialty:  Physician Assistant Contact information: [address] [place] [address] [phone]        Laurier Nancy, MD Follow up.   Specialty:  Cardiology Why:  nov 4 th at 10 am Contact information: [address] [place] [address] [phone]             Discharge Instructions  and  Discharge Medications     Discharge Instructions    Call MD for:  difficulty breathing, headache or visual disturbances   Complete by:  As directed    Call MD for:  persistant nausea and vomiting   Complete by:  As directed    Call MD for:  redness, tenderness, or signs of infection (pain, swelling, redness, odor or green/yellow discharge around incision site)   Complete by:  As directed    Call MD for:  severe uncontrolled pain   Complete by:  As directed    Call MD for:  temperature >100.4   Complete by:  As directed    Diet - low sodium heart healthy   Complete by:  As directed    Discharge instructions   Complete by:  As directed    1.  Patient may shower, but do not scrub wounds heavily and dab dry only. 2.  Do not submerge wounds in pool/tub for 1 week. 3.  Do not apply ointments or hydrogen peroxide to the wounds.   Driving Restrictions   Complete by:  As directed    Do not drive while taking narcotics for pain control.   Increase activity slowly   Complete by:  As directed    Lifting restrictions   Complete by:  As directed    No heavy lifting or pushing of more than 10-15 lbs for 4 weeks.   No dressing needed   Complete by:  As directed      Allergies as of 08/31/2018   No Known Allergies     Medication List    STOP taking these medications   cephALEXin 500 MG capsule Commonly known as:  KEFLEX   sulfamethoxazole-trimethoprim 800-160 MG tablet Commonly known as:  BACTRIM DS,SEPTRA DS     TAKE these medications   albuterol 108 (90 Base) MCG/ACT inhaler Commonly known as:  PROVENTIL HFA;VENTOLIN HFA Inhale 2 puffs every 4 (four) hours as needed into the lungs for wheezing or shortness of breath.    amLODipine 10 MG tablet Commonly known as:  NORVASC TAKE ONE TABLET BY MOUTH EVERY DAY   buPROPion 300 MG 24 hr tablet Commonly known as:  WELLBUTRIN XL Take 300 mg by mouth daily.   CRESTOR 10 MG tablet Generic drug:  rosuvastatin Take 1 tablet (10 mg total) by mouth daily.   FLUoxetine 20 MG capsule Commonly known as:  PROZAC Take 20 mg by mouth daily as needed.   ibuprofen 600 MG tablet Commonly known as:  ADVIL,MOTRIN Take 1 tablet (600 mg total) by mouth every 8 (eight) hours as needed for mild pain or moderate pain.   levofloxacin 500 MG tablet Commonly known as:  LEVAQUIN Take 1 tablet (500 mg total) by mouth daily for 5 days.   lisinopril-hydrochlorothiazide 20-25 MG tablet Commonly known as:  PRINZIDE,ZESTORETIC Take  1 tablet by mouth daily. What changed:  Another medication with the same name was removed. Continue taking this medication, and follow the directions you see here.   multivitamin with minerals Tabs tablet Take 1 tablet by mouth daily.   mupirocin ointment 2 % Commonly known as:  BACTROBAN Place 1 application into the nose daily as needed (sores).   nicotine 21 mg/24hr patch Commonly known as:  NICODERM CQ - dosed in mg/24 hours Place 21 mg onto the skin daily.   oxyCODONE 5 MG immediate release tablet Commonly known as:  Oxy IR/ROXICODONE Take 1 tablet (5 mg total) by mouth every 4 (four) hours as needed for severe pain.   OXYGEN Inhale 2 L into the lungs daily as needed (nightly).   TYLENOL 325 MG Caps Generic drug:  Acetaminophen Take 325 mg by mouth daily.         Diet and Activity recommendation: See Discharge Instructions above   Consults obtained -cardiology   Major procedures and Radiology Reports - PLEASE review detailed and final reports for all details, in brief -      Ct Abdomen Pelvis W Contrast  Result Date: 08/25/2018 CLINICAL DATA:  Incisional hernia without obstruction or gangrene. Swelling in the right  upper quadrant. EXAM: CT ABDOMEN AND PELVIS WITH CONTRAST TECHNIQUE: Multidetector CT imaging of the abdomen and pelvis was performed using the standard protocol following bolus administration of intravenous contrast. CONTRAST:  ISOVUE-300 IOPAMIDOL (ISOVUE-300) INJECTION 61% COMPARISON:  Abdominal ultrasound 06/29/2017 FINDINGS: Lower chest: Lung bases are clear. No pleural effusions. Study has limitations from motion artifact. Hepatobiliary: There is a poorly characterized 2 cm low-density structure in the anterior left hepatic lobe. This area is poorly characterized due to motion artifact. Although this could represent a cyst, it cannot be definitively characterized on these exam. Lesion was not confidently seen in this area on the prior right upper quadrant ultrasound. Gallbladder has been removed. Main portal venous system is patent. Pancreas: Unremarkable. No pancreatic ductal dilatation or surrounding inflammatory changes. Spleen: Normal in size without focal abnormality. Adrenals/Urinary Tract: Normal adrenal glands. Cysts in the right kidney upper pole, largest measuring 2.3 cm. Multiple left renal cysts, largest measuring 4.2 cm in the interpolar region. 0.9 cm hypodense structure along the posterior left kidney is slightly dense and too small to definitively characterize. No hydronephrosis. Urinary bladder is unremarkable. Stomach/Bowel: Stomach is within normal limits. Appendix appears normal. No evidence of bowel wall thickening, distention, or inflammatory changes. Vascular/Lymphatic: No significant vascular findings are present. No enlarged abdominal or pelvic lymph nodes. Reproductive: Prostate is unremarkable. Other: No free fluid. Small right paracentral ventral hernia containing fat best seen on sequence 2, image 25. The ventral hernia is just anterior to the inferior aspect of the left hepatic lobe. There is no bowel involvement in this ventral hernia. No other abdominal hernias. Negative  for free air. Musculoskeletal: Grade 1 anterolisthesis at L5-S1. Disc space narrowing at L5-S1. Difficult to exclude a pars defects at L5 particularly along the right side. There is extensive facet arthropathy at L5-S1. IMPRESSION: 1. Small right paracentral ventral hernia in the upper abdomen. The hernia contains fat. No bowel involvement and no inflammatory changes in this ventral hernia. 2. Bilateral renal cysts. At least one cyst is too small to be definitively characterized. 3. Probable hepatic cyst in left hepatic lobe but poorly characterized due to extensive motion artifact on this examination. This area could be further characterized with an abdominal ultrasound. Electronically Signed   By:  Richarda Overlie M.D.   On: 08/25/2018 14:30   US Carotid Bilateral  Result Date: 08/30/2018 CLINICAL DATA:  52 year old male with symptoms of cerebrovascular accident EXAM: BILATERAL CAROTID DUPLEX ULTRASOUND TECHNIQUE: Wallace Cullens scale imaging, color Doppler and duplex ultrasound were performed of bilateral carotid and vertebral arteries in the neck. COMPARISON:  None. FINDINGS: Criteria: Quantification of carotid stenosis is based on velocity parameters that correlate the residual internal carotid diameter with NASCET-based stenosis levels, using the diameter of the distal internal carotid lumen as the denominator for stenosis measurement. The following velocity measurements were obtained: RIGHT ICA: 87/24 cm/sec CCA: 115/12 cm/sec SYSTOLIC ICA/CCA RATIO:  0.8 ECA:  178 cm/sec LEFT ICA: 105/23 cm/sec CCA: 113/12 cm/sec SYSTOLIC ICA/CCA RATIO:  0.9 ECA:  127 cm/sec RIGHT CAROTID ARTERY: No significant atherosclerotic plaque or evidence of stenosis. RIGHT VERTEBRAL ARTERY:  Patent with normal antegrade flow. LEFT CAROTID ARTERY: No significant atherosclerotic plaque or evidence of stenosis. LEFT VERTEBRAL ARTERY:  Patent with normal antegrade flow. IMPRESSION: Negative exam. No evidence of significant atherosclerotic plaque or  stenosis in either internal carotid artery. Vertebral arteries are patent with antegrade flow. Signed, Sterling Big, MD, RPVI [facility] Electronically Signed   By: Malachy Moan M.D.   On: 08/30/2018 17:10    Micro Results    No results found for this or any previous visit (from the past 240 hour(s)).     Today   Subjective:   Eric Lawrence today has no headache,no chest abdominal pain,no new weakness tingling or numbness, feels much better wants to go home today.   Objective:   Blood pressure (!) 154/92, pulse 78, temperature 98.2 F (36.8 C), temperature source Oral, resp. rate 14, height 6\' 2"  (1.88 m), weight 119.6 kg, SpO2 94 %.   Intake/Output Summary (Last 24 hours) at 08/31/2018 0941 Last data filed at 08/31/2018 0500 Gross per 24 hour  Intake 2275.54 ml  Output 1260 ml  Net 1015.54 ml    Exam Awake Alert, Oriented x 3, No new F.N deficits, Normal affect [place].AT,PERRAL Supple Neck,No JVD, No cervical lymphadenopathy appriciated.  Symmetrical Chest wall movement, Good air movement bilaterally, CTAB RRR,No Gallops,Rubs or new Murmurs, No Parasternal Heave +ve B.Sounds, Abd Soft, Non tender, No organomegaly appriciated, No rebound -guarding or rigidity.  Patient incisional site is clean, glue present.  No Cyanosis, Clubbing or edema, No new Rash or bruise  Data Review   CBC w Diff:  Lab Results  Component Value Date   WBC 16.7 (H) 08/31/2018   HGB 15.3 08/31/2018   HGB 17.0 09/08/2012   HCT 44.6 08/31/2018   HCT 48.1 09/08/2012   PLT 190 08/31/2018   PLT 161 09/08/2012   LYMPHOPCT 35 07/28/2017   MONOPCT 6.9 12/07/2017   EOSPCT 1.6 12/07/2017   BASOPCT 0.4 12/07/2017    CMP:  Lab Results  Component Value Date   NA 141 08/31/2018   NA 142 08/05/2018   NA 145 09/08/2012   K 3.9 08/31/2018   K 3.5 09/08/2012   CL 105 08/31/2018   CL 107 09/08/2012   CO2 28 08/31/2018   CO2 28  09/08/2012   BUN 24 (H) 08/31/2018   BUN 12 08/05/2018   BUN 20 (H) 09/08/2012   CREATININE 1.27 (H) 08/31/2018   CREATININE 1.06 12/07/2017   PROT 6.9 08/05/2018   PROT 7.1 09/08/2012   ALBUMIN 4.5 08/05/2018   ALBUMIN 3.7 09/08/2012   BILITOT 0.3 08/05/2018   BILITOT 0.3  09/08/2012   ALKPHOS 88 08/05/2018   ALKPHOS 89 09/08/2012   AST 22 08/05/2018   AST 23 09/08/2012   ALT 28 08/05/2018   ALT 25 09/08/2012  .   Total Time in preparing paper work, data evaluation and todays exam - 35 minutes  Katha Hamming M.D on 08/31/2018 at 9:41 AM    Note: This dictation was prepared with Dragon dictation along with smaller phrase technology. Any transcriptional errors that result from this process are unintentional.

## 2018-09-03 ENCOUNTER — Telehealth: Payer: Self-pay

## 2018-09-03 NOTE — Telephone Encounter (Signed)
Flagged on EMMI report for not having a follow up scheduled.  First attempt to reach patient made 09/03/18 at 1:09pm, however unable to reach patient.  Left voicemail encouraging callback. Will attempt at later time.

## 2018-09-06 DIAGNOSIS — E785 Hyperlipidemia, unspecified: Secondary | ICD-10-CM | POA: Diagnosis not present

## 2018-09-06 DIAGNOSIS — I10 Essential (primary) hypertension: Secondary | ICD-10-CM | POA: Diagnosis not present

## 2018-09-06 DIAGNOSIS — I442 Atrioventricular block, complete: Secondary | ICD-10-CM | POA: Diagnosis not present

## 2018-09-06 DIAGNOSIS — J449 Chronic obstructive pulmonary disease, unspecified: Secondary | ICD-10-CM | POA: Diagnosis not present

## 2018-09-07 ENCOUNTER — Encounter: Payer: Self-pay | Admitting: Physician Assistant

## 2018-09-07 ENCOUNTER — Ambulatory Visit (INDEPENDENT_AMBULATORY_CARE_PROVIDER_SITE_OTHER): Payer: Medicare Other | Admitting: Physician Assistant

## 2018-09-07 VITALS — BP 154/74 | HR 83 | Temp 98.1°F | Resp 16 | Wt 269.8 lb

## 2018-09-07 DIAGNOSIS — I4589 Other specified conduction disorders: Secondary | ICD-10-CM | POA: Diagnosis not present

## 2018-09-07 DIAGNOSIS — I10 Essential (primary) hypertension: Secondary | ICD-10-CM | POA: Diagnosis not present

## 2018-09-07 DIAGNOSIS — Z9889 Other specified postprocedural states: Secondary | ICD-10-CM | POA: Diagnosis not present

## 2018-09-07 DIAGNOSIS — Z8719 Personal history of other diseases of the digestive system: Secondary | ICD-10-CM

## 2018-09-07 MED ORDER — LISINOPRIL 20 MG PO TABS: 20.0000 mg | ORAL_TABLET | Freq: Every day | ORAL | 0 refills | Status: DC

## 2018-09-07 NOTE — Progress Notes (Signed)
Patient: Eric Lawrence Male    DOB: [birth year]   52 y.o.   MRN: [id number] Visit Date: 09/10/2018  Today's Provider: Trey Sailors, PA-C   Chief Complaint  Patient presents with  . Follow-up   Subjective:    HPI  Follow up for blood pressure  The patient was last seen for this 1 months ago. Changes made at last visit include increase medication. Currently taking 10 mg amlodipine and Lisinopril HCTZ 20-25 mg.   He reports excellent compliance with treatment. He feels that condition is Improved. He is having side effects.  BP Readings from Last 3 Encounters:  09/10/18 (!) 154/74  08/31/18 (!) 154/92  08/24/18 (!) 161/95   In the interim, he had an incisional hernia repair on 08/30/2018 by Dr. Aleen Campi. Post operatively he experienced AV disassociation with heart rate of 30 bpm and dizziness. His troponin was normal. His echo showed EF was 45% with wall motion abnormalities suggestive of CAD and mild grade 1 diastolic dysfunction.  He reports he has had follow up with Dr. Welton Flakes and is scheduled to have a stress test in a few days. He continues to take blood pressure medications and statin.  As for follow up of his incisional hernia repair, he reports he has not yet seen the surgeon. He reports incision is warm, dry, not red. He has not gotten incision wet.   ------------------------------------------------------------------------------------       No Known Allergies   Current Outpatient Medications:  .  Acetaminophen (TYLENOL) 325 MG CAPS, Take 325 mg by mouth daily. , Disp: , Rfl:  .  albuterol (PROVENTIL HFA;VENTOLIN HFA) 108 (90 Base) MCG/ACT inhaler, Inhale 2 puffs every 4 (four) hours as needed into the lungs for wheezing or shortness of breath., Disp: 1 Inhaler, Rfl: 3 .  amLODipine (NORVASC) 10 MG tablet, TAKE ONE TABLET BY MOUTH EVERY DAY (Patient taking differently: Take 10 mg by mouth daily. ), Disp: 30 tablet, Rfl: 0 .  buPROPion (WELLBUTRIN XL) 300 MG  24 hr tablet, Take 300 mg by mouth daily. , Disp: , Rfl:  .  FLUoxetine (PROZAC) 20 MG capsule, Take 20 mg by mouth daily as needed. , Disp: , Rfl:  .  ibuprofen (ADVIL,MOTRIN) 600 MG tablet, Take 1 tablet (600 mg total) by mouth every 8 (eight) hours as needed for mild pain or moderate pain., Disp: 30 tablet, Rfl: 0 .  lisinopril-hydrochlorothiazide (PRINZIDE,ZESTORETIC) 20-25 MG tablet, Take 1 tablet by mouth daily., Disp: , Rfl:  .  Multiple Vitamin (MULTIVITAMIN WITH MINERALS) TABS tablet, Take 1 tablet by mouth daily., Disp: , Rfl:  .  nicotine (NICODERM CQ - DOSED IN MG/24 HOURS) 21 mg/24hr patch, Place 21 mg onto the skin daily. , Disp: , Rfl: 3 .  oxyCODONE (OXY IR/ROXICODONE) 5 MG immediate release tablet, Take 1 tablet (5 mg total) by mouth every 4 (four) hours as needed for moderate pain or severe pain., Disp: 30 tablet, Rfl: 0 .  OXYGEN, Inhale 2 L into the lungs daily as needed (nightly)., Disp: , Rfl:  .  rosuvastatin (CRESTOR) 10 MG tablet, Take 1 tablet (10 mg total) by mouth daily., Disp: 90 tablet, Rfl: 3 .  lisinopril (PRINIVIL,ZESTRIL) 20 MG tablet, Take 1 tablet (20 mg total) by mouth daily., Disp: 90 tablet, Rfl: 0 .  mupirocin ointment (BACTROBAN) 2 %, Place 1 application into the nose daily as needed (sores)., Disp: , Rfl:   Review of Systems  Constitutional: Negative.  Respiratory: Negative.   Cardiovascular: Negative.     Social History   Tobacco Use  . Smoking status: Current Every Day Smoker    Packs/day: 0.25    Types: Cigarettes  . Smokeless tobacco: Never Used  Substance Use Topics  . Alcohol use: No    Alcohol/week: 0.0 standard drinks   Objective:   BP (!) 154/74   Pulse 83   Temp 98.1 F (36.7 C) (Oral)   Resp 16   Wt 269 lb 12.8 oz (122.4 kg)   SpO2 98%   BMI 34.64 kg/m  Vitals:   09/07/18 1033 09/10/18 0905  BP: (!) 178/77 (!) 154/74  Pulse: 83   Resp: 16   Temp: 98.1 F (36.7 C)   TempSrc: Oral   SpO2: 98%   Weight: 269 lb 12.8 oz  (122.4 kg)      Physical Exam  Constitutional: He is oriented to person, place, and time. He appears well-developed and well-nourished.  Cardiovascular: Normal rate and regular rhythm.  Pulmonary/Chest: Effort normal and breath sounds normal.  Abdominal: Soft.  Musculoskeletal: Normal range of motion.  Neurological: He is alert and oriented to person, place, and time.  Skin: Skin is warm and dry. Rash noted.     Psychiatric: He has a normal mood and affect. His behavior is normal.        Assessment & Plan:     1. Essential hypertension  Hypertension improved but not well controlled. Due to history of post operative AV disassociation and bradycardia, will not give beta blockers or rate controlling CCB. Will increase lIisnopril to 40 mg, so will add 20 mg pill to combo pill.   - lisinopril (PRINIVIL,ZESTRIL) 20 MG tablet; Take 1 tablet (20 mg total) by mouth daily.  Dispense: 90 tablet; Refill: 0 - Comprehensive metabolic panel  2. History of incisional hernia repair  Patient has not seen surgeon and does not have follow up scheduled. Advised patient to schedule follow up with surgeon and he says he will walk down to office after appt today and do that. In the mean time, incision looks well and I have advised him to change from clear tape to paper tape as he appears to be allergic. Paper tape provided.   3. AV dissociation  Following with cardiology, Dr. Welton Flakes.   Return in about 1 month (around 10/07/2018) for HTN.  The entirety of the information documented in the History of Present Illness, Review of Systems and Physical Exam were personally obtained by me. Portions of this information were initially documented by Rondel Baton, CMA and reviewed by me for thoroughness and accuracy.          Trey Sailors, PA-C  [facility]

## 2018-09-07 NOTE — Patient Instructions (Signed)
We are adding 20 mg Lisinopril in addition to all of your blood pressure medications.   Please schedule follow up with Dr. Aleen Campi for hernia surgery.

## 2018-09-08 ENCOUNTER — Telehealth: Payer: Self-pay

## 2018-09-08 LAB — COMPREHENSIVE METABOLIC PANEL
ALT: 26 IU/L (ref 0–44)
AST: 16 IU/L (ref 0–40)
Albumin/Globulin Ratio: 2 (ref 1.2–2.2)
Albumin: 4.3 g/dL (ref 3.5–5.5)
Alkaline Phosphatase: 85 IU/L (ref 39–117)
BUN/Creatinine Ratio: 14 (ref 9–20)
BUN: 14 mg/dL (ref 6–24)
Bilirubin Total: 0.3 mg/dL (ref 0.0–1.2)
CO2: 24 mmol/L (ref 20–29)
Calcium: 9.2 mg/dL (ref 8.7–10.2)
Chloride: 101 mmol/L (ref 96–106)
Creatinine, Ser: 1.01 mg/dL (ref 0.76–1.27)
GFR calc Af Amer: 97 mL/min/{1.73_m2} (ref >59)
GFR calc non Af Amer: 84 mL/min/{1.73_m2} (ref >59)
Globulin, Total: 2.1 g/dL (ref 1.5–4.5)
Glucose: 128 mg/dL — ABNORMAL HIGH (ref 65–99)
Potassium: 4.1 mmol/L (ref 3.5–5.2)
Sodium: 139 mmol/L (ref 134–144)
Total Protein: 6.4 g/dL (ref 6.0–8.5)

## 2018-09-08 NOTE — Telephone Encounter (Signed)
Per chart review, patient had appointment with PCP yesterday.  No further need to do second EMMI attempt.

## 2018-09-08 NOTE — Telephone Encounter (Signed)
-----   Message from Trey Sailors, [place] sent at 09/08/2018  9:04 AM EST ----- CMET stable. Continue new dosage of medication and be sure to follow up with Dr. Aleen Campi.

## 2018-09-08 NOTE — Telephone Encounter (Signed)
Patient advised as below.  

## 2018-09-13 DIAGNOSIS — R079 Chest pain, unspecified: Secondary | ICD-10-CM | POA: Diagnosis not present

## 2018-09-14 ENCOUNTER — Telehealth: Payer: Self-pay | Admitting: Physician Assistant

## 2018-09-14 DIAGNOSIS — I10 Essential (primary) hypertension: Secondary | ICD-10-CM

## 2018-09-14 MED ORDER — LISINOPRIL-HYDROCHLOROTHIAZIDE 20-25 MG PO TABS: 1.0000 | ORAL_TABLET | Freq: Every day | ORAL | 1 refills | Status: DC

## 2018-09-14 NOTE — Telephone Encounter (Signed)
Pt requesting lisinopril-hydrochlorothiazide (PRINZIDE,ZESTORETIC) 20-25 MG tablet sent to [facility] [address]  States the [employer] has not received RX yet.

## 2018-09-15 DIAGNOSIS — I442 Atrioventricular block, complete: Secondary | ICD-10-CM | POA: Diagnosis not present

## 2018-09-15 DIAGNOSIS — R55 Syncope and collapse: Secondary | ICD-10-CM | POA: Diagnosis not present

## 2018-09-16 DIAGNOSIS — I10 Essential (primary) hypertension: Secondary | ICD-10-CM | POA: Diagnosis not present

## 2018-09-16 DIAGNOSIS — R55 Syncope and collapse: Secondary | ICD-10-CM | POA: Diagnosis not present

## 2018-09-16 DIAGNOSIS — I442 Atrioventricular block, complete: Secondary | ICD-10-CM | POA: Diagnosis not present

## 2018-09-16 DIAGNOSIS — I25110 Atherosclerotic heart disease of native coronary artery with unstable angina pectoris: Secondary | ICD-10-CM | POA: Diagnosis not present

## 2018-09-16 DIAGNOSIS — E785 Hyperlipidemia, unspecified: Secondary | ICD-10-CM | POA: Diagnosis not present

## 2018-09-17 ENCOUNTER — Encounter: Payer: Self-pay | Admitting: Surgery

## 2018-09-17 ENCOUNTER — Other Ambulatory Visit: Payer: Self-pay | Admitting: Physician Assistant

## 2018-09-17 ENCOUNTER — Ambulatory Visit (INDEPENDENT_AMBULATORY_CARE_PROVIDER_SITE_OTHER): Payer: Medicare Other | Admitting: Surgery

## 2018-09-17 VITALS — BP 144/91 | HR 69 | Temp 97.9°F | Resp 18 | Ht 74.0 in | Wt 270.2 lb

## 2018-09-17 DIAGNOSIS — Z09 Encounter for follow-up examination after completed treatment for conditions other than malignant neoplasm: Secondary | ICD-10-CM

## 2018-09-17 DIAGNOSIS — K432 Incisional hernia without obstruction or gangrene: Secondary | ICD-10-CM

## 2018-09-17 DIAGNOSIS — I10 Essential (primary) hypertension: Secondary | ICD-10-CM

## 2018-09-17 MED ORDER — LISINOPRIL-HYDROCHLOROTHIAZIDE 20-25 MG PO TABS: 1.0000 | ORAL_TABLET | Freq: Every day | ORAL | 1 refills | Status: DC

## 2018-09-17 MED ORDER — LISINOPRIL 20 MG PO TABS: 20.0000 mg | ORAL_TABLET | Freq: Every day | ORAL | 1 refills | Status: DC

## 2018-09-17 NOTE — Patient Instructions (Addendum)
Patient needs to return to the office as needed return to full duty without any restrictions on 09/27/18. Call the office with any questions or concerns.

## 2018-09-17 NOTE — Progress Notes (Signed)
09/17/2018  HPI: Eric Lawrence is a 52 y.o. male s/p incisional hernia repair on 10/28.  Post-operatively, he had an episode of AV bock and was admitted for observation.  He was seen by Dr. Welton FlakesKhan and has a holter monitor.  He reports he's going to need a pacemaker.  From surgical standpoint, he's been doing well and denies any pain or hernia recurrence.  Vital signs: BP (!) 144/91   Pulse 69   Temp 97.9 F (36.6 C) (Temporal)   Resp 18   Ht 6\' 2"  (1.88 m)   Wt 270 lb 3.2 oz (122.6 kg)   SpO2 99%   BMI 34.69 kg/m    Physical Exam: Constitutional: No acute distress Abdomen:  Soft, nondistended, nontender to palpation. Incision is clean, dry, intact with some firmness consistent with scarring/healing process.  No hernia recurrence.  Assessment/Plan: This is a 52 y.o. male s/p incisional hernia repair.  --no complications or recurrence at this time.  Return precautions given to patient.  No contraindication from surgery standpoint for pacemaker placement. --may follow up prn.   Howie IllJose Luis Briteny Fulghum, MD [facility]

## 2018-09-18 DIAGNOSIS — J449 Chronic obstructive pulmonary disease, unspecified: Secondary | ICD-10-CM | POA: Diagnosis not present

## 2018-09-18 DIAGNOSIS — I720 Aneurysm of carotid artery: Secondary | ICD-10-CM | POA: Diagnosis not present

## 2018-09-21 DIAGNOSIS — E782 Mixed hyperlipidemia: Secondary | ICD-10-CM | POA: Diagnosis not present

## 2018-09-21 DIAGNOSIS — I25110 Atherosclerotic heart disease of native coronary artery with unstable angina pectoris: Secondary | ICD-10-CM | POA: Diagnosis not present

## 2018-09-21 DIAGNOSIS — I442 Atrioventricular block, complete: Secondary | ICD-10-CM | POA: Diagnosis not present

## 2018-09-21 DIAGNOSIS — R0602 Shortness of breath: Secondary | ICD-10-CM | POA: Diagnosis not present

## 2018-09-21 DIAGNOSIS — I10 Essential (primary) hypertension: Secondary | ICD-10-CM | POA: Diagnosis not present

## 2018-09-23 DIAGNOSIS — E782 Mixed hyperlipidemia: Secondary | ICD-10-CM | POA: Diagnosis not present

## 2018-09-23 DIAGNOSIS — R0602 Shortness of breath: Secondary | ICD-10-CM | POA: Diagnosis not present

## 2018-09-23 DIAGNOSIS — I25110 Atherosclerotic heart disease of native coronary artery with unstable angina pectoris: Secondary | ICD-10-CM | POA: Diagnosis not present

## 2018-09-23 DIAGNOSIS — I10 Essential (primary) hypertension: Secondary | ICD-10-CM | POA: Diagnosis not present

## 2018-09-23 DIAGNOSIS — I442 Atrioventricular block, complete: Secondary | ICD-10-CM | POA: Diagnosis not present

## 2018-09-28 DIAGNOSIS — E7800 Pure hypercholesterolemia, unspecified: Secondary | ICD-10-CM | POA: Diagnosis not present

## 2018-09-28 DIAGNOSIS — G4730 Sleep apnea, unspecified: Secondary | ICD-10-CM | POA: Diagnosis not present

## 2018-09-28 DIAGNOSIS — I4589 Other specified conduction disorders: Secondary | ICD-10-CM | POA: Diagnosis not present

## 2018-09-28 DIAGNOSIS — J439 Emphysema, unspecified: Secondary | ICD-10-CM | POA: Diagnosis not present

## 2018-09-28 DIAGNOSIS — I10 Essential (primary) hypertension: Secondary | ICD-10-CM | POA: Diagnosis not present

## 2018-10-07 ENCOUNTER — Ambulatory Visit
Admission: RE | Admit: 2018-10-07 | Discharge: 2018-10-07 | Disposition: A | Discharge status: Home / Self Care | Payer: Medicare Other | Source: Ambulatory Visit | Attending: Cardiology | Admitting: Cardiology

## 2018-10-07 ENCOUNTER — Other Ambulatory Visit: Payer: Self-pay

## 2018-10-07 ENCOUNTER — Encounter
Admission: RE | Admit: 2018-10-07 | Discharge: 2018-10-07 | Disposition: A | Discharge status: Home / Self Care | Payer: Medicare Other | Source: Ambulatory Visit | Attending: Cardiology | Admitting: Cardiology

## 2018-10-07 DIAGNOSIS — Z01818 Encounter for other preprocedural examination: Secondary | ICD-10-CM | POA: Diagnosis not present

## 2018-10-07 DIAGNOSIS — Z01810 Encounter for preprocedural cardiovascular examination: Secondary | ICD-10-CM | POA: Diagnosis not present

## 2018-10-07 DIAGNOSIS — R0989 Other specified symptoms and signs involving the circulatory and respiratory systems: Secondary | ICD-10-CM | POA: Diagnosis not present

## 2018-10-07 HISTORY — DX: Attention-deficit hyperactivity disorder, unspecified type: F90.9

## 2018-10-07 HISTORY — DX: Cardiac arrhythmia, unspecified: I49.9

## 2018-10-07 LAB — CBC
HCT: 43.9 % (ref 39.0–52.0)
Hemoglobin: 15.3 g/dL (ref 13.0–17.0)
MCH: 30.9 pg (ref 26.0–34.0)
MCHC: 34.9 g/dL (ref 30.0–36.0)
MCV: 88.7 fL (ref 80.0–100.0)
Platelets: 181 10*3/uL (ref 150–400)
RBC: 4.95 MIL/uL (ref 4.22–5.81)
RDW: 13 % (ref 11.5–15.5)
WBC: 7.4 10*3/uL (ref 4.0–10.5)
nRBC: 0 % (ref 0.0–0.2)

## 2018-10-07 LAB — PROTIME-INR
INR: 0.94
PROTHROMBIN TIME: 12.5 s (ref 11.4–15.2)

## 2018-10-07 LAB — BASIC METABOLIC PANEL
ANION GAP: 10 (ref 5–15)
BUN: 13 mg/dL (ref 6–20)
CO2: 26 mmol/L (ref 22–32)
Calcium: 9.3 mg/dL (ref 8.9–10.3)
Chloride: 104 mmol/L (ref 98–111)
Creatinine, Ser: 0.98 mg/dL (ref 0.61–1.24)
GFR calc Af Amer: >60 mL/min (ref >60)
Glucose, Bld: 162 mg/dL — ABNORMAL HIGH (ref 70–99)
Potassium: 3.8 mmol/L (ref 3.5–5.1)
Sodium: 140 mmol/L (ref 135–145)

## 2018-10-07 LAB — SURGICAL PCR SCREEN
MRSA, PCR: NEGATIVE
Staphylococcus aureus: NEGATIVE

## 2018-10-07 LAB — APTT: APTT: 31 s (ref 24–36)

## 2018-10-07 NOTE — Patient Outreach (Signed)
[facility] Care Management  10/07/2018  Eric Lawrence 01/02/1966 [id number]   Medication Adherence call to Eric Lawrence spoke with patient he said for almost a year he stop taking his medications because mother deceased but, patient is back on track on his medication and has plenty at this time. Eric Lawrence is showing past due under [employer].   Lillia AbedAna Ollison-Moran CPhT Pharmacy Technician [facility] Care Management Direct Dial [phone]  Fax [phone] [email or web address]

## 2018-10-07 NOTE — Patient Instructions (Signed)
Your procedure is scheduled on: 10/13/18 Wed Report to Same Day Surgery 2nd floor medical mall [facility] Entrance-take elevator on left to 2nd floor.  Check in with surgery information desk.) To find out your arrival time please call [phone] between 1PM - 3PM on 10/12/18 Tues  Remember: Instructions that are not followed completely may result in serious medical risk, up to and including death, or upon the discretion of your surgeon and anesthesiologist your surgery may need to be rescheduled.    _x___ 1. Do not eat food after midnight the night before your procedure. You may drink clear liquids up to 2 hours before you are scheduled to arrive at the hospital for your procedure.  Do not drink clear liquids within 2 hours of your scheduled arrival to the hospital.  Clear liquids include  --Water or Apple juice without pulp  --Clear carbohydrate beverage such as ClearFast or Gatorade  --Black Coffee or Clear Tea (No milk, no creamers, do not add anything to                  the coffee or Tea Type 1 and type 2 diabetics should only drink water.   ____Ensure clear carbohydrate drink on the way to the hospital for bariatric patients  ____Ensure clear carbohydrate drink 3 hours before surgery for Dr Rutherford Nail patients if physician instructed.   No gum chewing or hard candies.     __x__ 2. No Alcohol for 24 hours before or after surgery.   __x__3. No Smoking or e-cigarettes for 24 prior to surgery.  Do not use any chewable tobacco products for at least 6 hour prior to surgery   ____  4. Bring all medications with you on the day of surgery if instructed.    __x__ 5. Notify your doctor if there is any change in your medical condition     (cold, fever, infections).    x___6. On the morning of surgery brush your teeth with toothpaste and water.  You may rinse your mouth with mouth wash if you wish.  Do not swallow any toothpaste or mouthwash.   Do not wear jewelry, make-up, hairpins,  clips or nail polish.  Do not wear lotions, powders, or perfumes. You may wear deodorant.  Do not shave 48 hours prior to surgery. Men may shave face and neck.  Do not bring valuables to the hospital.    [facility] is not responsible for any belongings or valuables.               Contacts, dentures or bridgework may not be worn into surgery.  Leave your suitcase in the car. After surgery it may be brought to your room.  For patients admitted to the hospital, discharge time is determined by your                       treatment team.  _  Patients discharged the day of surgery will not be allowed to drive home.  You will need someone to drive you home and stay with you the night of your procedure.    Please read over the following fact sheets that you were given:   [facility] Preparing for Surgery and or MRSA Information   _x___ Take anti-hypertensive listed below, cardiac, seizure, asthma,     anti-reflux and psychiatric medicines. These include:  1. albuterol (PROVENTIL HFA;VENTOLIN HFA) 108 (90 Base) MCG/ACT inhaler  2.amLODipine (NORVASC) 10 MG tablet  3.buPROPion (WELLBUTRIN XL)  300 MG 24 hr tablet  4.rosuvastatin (CRESTOR) 10 MG tablet  5.  6.  ____Fleets enema or Magnesium Citrate as directed.   _x___ Use CHG Soap or sage wipes as directed on instruction sheet   _x___ Use inhalers on the day of surgery and bring to hospital day of surgery  ____ Stop Metformin and Janumet 2 days prior to surgery.    ____ Take 1/2 of usual insulin dose the night before surgery and none on the morning     surgery.   _x___ Follow recommendations from Cardiologist, Pulmonologist or PCP regarding          stopping Aspirin, Coumadin, Plavix ,Eliquis, Effient, or Pradaxa, and Pletal.  X____Stop Anti-inflammatories such as Advil, Aleve, Ibuprofen, Motrin, Naproxen, Naprosyn, Goodies powders or aspirin products. OK to take Tylenol and                          Celebrex.   _x___ Stop supplements until  after surgery.  But may continue Vitamin D, Vitamin B,       and multivitamin.   ____ Bring C-Pap to the hospital.

## 2018-10-08 ENCOUNTER — Ambulatory Visit: Payer: Medicare Other | Admitting: Physician Assistant

## 2018-10-12 MED ORDER — LACTATED RINGERS IV SOLN
INTRAVENOUS | Status: DC
  Administered 2018-10-13: 12:00:00 via INTRAVENOUS

## 2018-10-12 MED ORDER — CEFAZOLIN SODIUM-DEXTROSE 1-4 GM/50ML-% IV SOLN
1.0000 g | Freq: Once | INTRAVENOUS | Status: AC
  Administered 2018-10-13: 1 g via INTRAVENOUS

## 2018-10-12 MED ORDER — SODIUM CHLORIDE 0.9 % IV SOLN
Freq: Once | INTRAVENOUS | Status: DC
  Filled 2018-10-12: qty 2

## 2018-10-12 MED ORDER — FAMOTIDINE 20 MG PO TABS: 20.0000 mg | ORAL_TABLET | Freq: Once | ORAL | Status: DC

## 2018-10-13 ENCOUNTER — Observation Stay
Admission: RE | Admit: 2018-10-13 | Discharge: 2018-10-14 | Disposition: A | Discharge status: Home / Self Care | Payer: Medicare Other | Source: Ambulatory Visit | Attending: Cardiology | Admitting: Cardiology

## 2018-10-13 ENCOUNTER — Observation Stay: Payer: Medicare Other

## 2018-10-13 ENCOUNTER — Encounter: Payer: Self-pay | Admitting: Emergency Medicine

## 2018-10-13 ENCOUNTER — Encounter
Admission: RE | Disposition: A | Discharge status: Home / Self Care | Payer: Self-pay | Source: Ambulatory Visit | Attending: Cardiology

## 2018-10-13 ENCOUNTER — Inpatient Hospital Stay: Payer: Medicare Other | Admitting: Anesthesiology

## 2018-10-13 ENCOUNTER — Other Ambulatory Visit: Payer: Self-pay

## 2018-10-13 ENCOUNTER — Inpatient Hospital Stay: Payer: Medicare Other

## 2018-10-13 DIAGNOSIS — E7800 Pure hypercholesterolemia, unspecified: Secondary | ICD-10-CM | POA: Diagnosis not present

## 2018-10-13 DIAGNOSIS — I4589 Other specified conduction disorders: Secondary | ICD-10-CM | POA: Diagnosis not present

## 2018-10-13 DIAGNOSIS — F17210 Nicotine dependence, cigarettes, uncomplicated: Secondary | ICD-10-CM | POA: Insufficient documentation

## 2018-10-13 DIAGNOSIS — G4730 Sleep apnea, unspecified: Secondary | ICD-10-CM | POA: Insufficient documentation

## 2018-10-13 DIAGNOSIS — Z950 Presence of cardiac pacemaker: Secondary | ICD-10-CM | POA: Diagnosis not present

## 2018-10-13 DIAGNOSIS — K219 Gastro-esophageal reflux disease without esophagitis: Secondary | ICD-10-CM | POA: Diagnosis not present

## 2018-10-13 DIAGNOSIS — I495 Sick sinus syndrome: Secondary | ICD-10-CM | POA: Diagnosis not present

## 2018-10-13 DIAGNOSIS — I10 Essential (primary) hypertension: Secondary | ICD-10-CM | POA: Diagnosis not present

## 2018-10-13 DIAGNOSIS — J449 Chronic obstructive pulmonary disease, unspecified: Secondary | ICD-10-CM | POA: Diagnosis not present

## 2018-10-13 DIAGNOSIS — Z9861 Coronary angioplasty status: Secondary | ICD-10-CM | POA: Diagnosis not present

## 2018-10-13 HISTORY — PX: PACEMAKER INSERTION: SHX728

## 2018-10-13 SURGERY — INSERTION, CARDIAC PACEMAKER
Anesthesia: General | Site: Chest | Laterality: Left

## 2018-10-13 MED ORDER — HYDROCHLOROTHIAZIDE 25 MG PO TABS
25.0000 mg | ORAL_TABLET | Freq: Every day | ORAL | Status: DC
  Administered 2018-10-14: 25 mg via ORAL
  Filled 2018-10-13 (×2): qty 1

## 2018-10-13 MED ORDER — LISINOPRIL 20 MG PO TABS
20.0000 mg | ORAL_TABLET | Freq: Every day | ORAL | Status: DC
  Administered 2018-10-14: 20 mg via ORAL
  Filled 2018-10-13 (×2): qty 1

## 2018-10-13 MED ORDER — FENTANYL CITRATE (PF) 100 MCG/2ML IJ SOLN
INTRAMUSCULAR | Status: AC
  Filled 2018-10-13: qty 2

## 2018-10-13 MED ORDER — ALBUTEROL SULFATE (2.5 MG/3ML) 0.083% IN NEBU: 3.0000 mL | INHALATION_SOLUTION | Freq: Four times a day (QID) | RESPIRATORY_TRACT | Status: DC | PRN

## 2018-10-13 MED ORDER — PROPOFOL 500 MG/50ML IV EMUL
INTRAVENOUS | Status: AC
  Filled 2018-10-13: qty 50

## 2018-10-13 MED ORDER — BUPROPION HCL ER (XL) 300 MG PO TB24
300.0000 mg | ORAL_TABLET | Freq: Every day | ORAL | Status: DC
  Administered 2018-10-14: 300 mg via ORAL
  Filled 2018-10-13 (×2): qty 1

## 2018-10-13 MED ORDER — CEFAZOLIN SODIUM-DEXTROSE 1-4 GM/50ML-% IV SOLN
1.0000 g | Freq: Four times a day (QID) | INTRAVENOUS | Status: AC
  Administered 2018-10-13 – 2018-10-14 (×3): 1 g via INTRAVENOUS
  Filled 2018-10-13 (×3): qty 50

## 2018-10-13 MED ORDER — SODIUM CHLORIDE 0.9 % IV SOLN
INTRAVENOUS | Status: DC | PRN
  Administered 2018-10-13: 12:00:00

## 2018-10-13 MED ORDER — LABETALOL HCL 5 MG/ML IV SOLN
INTRAVENOUS | Status: AC
  Filled 2018-10-13: qty 4

## 2018-10-13 MED ORDER — ONDANSETRON HCL 4 MG/2ML IJ SOLN: 4.0000 mg | Freq: Four times a day (QID) | INTRAMUSCULAR | Status: DC | PRN

## 2018-10-13 MED ORDER — PROPOFOL 10 MG/ML IV BOLUS
INTRAVENOUS | Status: DC | PRN
  Administered 2018-10-13 (×2): 10 mg via INTRAVENOUS
  Administered 2018-10-13: 20 mg via INTRAVENOUS

## 2018-10-13 MED ORDER — MIDAZOLAM HCL 2 MG/2ML IJ SOLN
INTRAMUSCULAR | Status: DC | PRN
  Administered 2018-10-13: 1.5 mg via INTRAVENOUS
  Administered 2018-10-13: .5 mg via INTRAVENOUS

## 2018-10-13 MED ORDER — SODIUM CHLORIDE 0.9 % IV SOLN
INTRAVENOUS | Status: DC | PRN
  Administered 2018-10-13: 500 mL via INTRAVENOUS

## 2018-10-13 MED ORDER — LABETALOL HCL 5 MG/ML IV SOLN
5.0000 mg | Freq: Once | INTRAVENOUS | Status: AC
  Administered 2018-10-13: 5 mg via INTRAVENOUS

## 2018-10-13 MED ORDER — HYDRALAZINE HCL 20 MG/ML IJ SOLN
10.0000 mg | Freq: Once | INTRAMUSCULAR | Status: AC
  Administered 2018-10-13: 10 mg via INTRAVENOUS

## 2018-10-13 MED ORDER — PROPOFOL 10 MG/ML IV BOLUS
INTRAVENOUS | Status: AC
  Filled 2018-10-13: qty 20

## 2018-10-13 MED ORDER — LIDOCAINE 1 % OPTIME INJ - NO CHARGE
INTRAMUSCULAR | Status: DC | PRN
  Administered 2018-10-13: 30 mL

## 2018-10-13 MED ORDER — FENTANYL CITRATE (PF) 100 MCG/2ML IJ SOLN
25.0000 ug | INTRAMUSCULAR | Status: DC | PRN
  Administered 2018-10-13: 50 ug via INTRAVENOUS

## 2018-10-13 MED ORDER — CEFAZOLIN SODIUM-DEXTROSE 1-4 GM/50ML-% IV SOLN
INTRAVENOUS | Status: AC
  Filled 2018-10-13: qty 50

## 2018-10-13 MED ORDER — MIDAZOLAM HCL 2 MG/2ML IJ SOLN
INTRAMUSCULAR | Status: AC
  Filled 2018-10-13: qty 2

## 2018-10-13 MED ORDER — ACETAMINOPHEN 325 MG PO TABS: 325.0000 mg | ORAL_TABLET | ORAL | Status: DC | PRN

## 2018-10-13 MED ORDER — ROSUVASTATIN CALCIUM 10 MG PO TABS
10.0000 mg | ORAL_TABLET | Freq: Every day | ORAL | Status: DC
  Administered 2018-10-13: 10 mg via ORAL
  Filled 2018-10-13: qty 1

## 2018-10-13 MED ORDER — NICOTINE 21 MG/24HR TD PT24
21.0000 mg | MEDICATED_PATCH | Freq: Every day | TRANSDERMAL | Status: DC
  Administered 2018-10-13 – 2018-10-14 (×2): 21 mg via TRANSDERMAL
  Filled 2018-10-13 (×2): qty 1

## 2018-10-13 MED ORDER — ONDANSETRON HCL 4 MG/2ML IJ SOLN: 4.0000 mg | Freq: Once | INTRAMUSCULAR | Status: DC | PRN

## 2018-10-13 MED ORDER — PROPOFOL 500 MG/50ML IV EMUL
INTRAVENOUS | Status: DC | PRN
  Administered 2018-10-13: 25 ug/kg/min via INTRAVENOUS
  Administered 2018-10-13: 50 ug/kg/min via INTRAVENOUS

## 2018-10-13 MED ORDER — OXYCODONE-ACETAMINOPHEN 5-325 MG PO TABS
1.0000 | ORAL_TABLET | Freq: Four times a day (QID) | ORAL | Status: DC | PRN
  Administered 2018-10-13 – 2018-10-14 (×2): 1 via ORAL
  Filled 2018-10-13 (×2): qty 1

## 2018-10-13 MED ORDER — FENTANYL CITRATE (PF) 100 MCG/2ML IJ SOLN
INTRAMUSCULAR | Status: DC | PRN
  Administered 2018-10-13: 50 ug via INTRAVENOUS
  Administered 2018-10-13 (×2): 25 ug via INTRAVENOUS

## 2018-10-13 MED ORDER — AMLODIPINE BESYLATE 10 MG PO TABS
10.0000 mg | ORAL_TABLET | Freq: Every day | ORAL | Status: DC
  Administered 2018-10-14: 10 mg via ORAL
  Filled 2018-10-13: qty 1

## 2018-10-13 MED ORDER — HYDRALAZINE HCL 20 MG/ML IJ SOLN
INTRAMUSCULAR | Status: AC
  Filled 2018-10-13: qty 1

## 2018-10-13 SURGICAL SUPPLY — 42 items
BAG DECANTER FOR FLEXI CONT (MISCELLANEOUS) ×2 IMPLANT
BENZOIN TINCTURE PRP APPL 2/3 (GAUZE/BANDAGES/DRESSINGS) ×2 IMPLANT
BRUSH SCRUB EZ  4% CHG (MISCELLANEOUS) ×1
BRUSH SCRUB EZ 4% CHG (MISCELLANEOUS) ×1 IMPLANT
CABLE SURG 12 DISP A/V CHANNEL (MISCELLANEOUS) ×4 IMPLANT
CANISTER SUCT 1200ML W/VALVE (MISCELLANEOUS) ×2 IMPLANT
CHLORAPREP W/TINT 26ML (MISCELLANEOUS) ×2 IMPLANT
COVER LIGHT HANDLE STERIS (MISCELLANEOUS) ×4 IMPLANT
COVER MAYO STAND STRL (DRAPES) ×2 IMPLANT
COVER WAND RF STERILE (DRAPES) ×2 IMPLANT
DRAPE C-ARM XRAY 36X54 (DRAPES) ×2 IMPLANT
DRSG TEGADERM 2-3/8X2-3/4 SM (GAUZE/BANDAGES/DRESSINGS) ×2 IMPLANT
DRSG TEGADERM 4X4.75 (GAUZE/BANDAGES/DRESSINGS) ×2 IMPLANT
DRSG TELFA 4X3 1S NADH ST (GAUZE/BANDAGES/DRESSINGS) ×2 IMPLANT
ELECT REM PT RETURN 9FT ADLT (ELECTROSURGICAL) ×2
ELECTRODE REM PT RTRN 9FT ADLT (ELECTROSURGICAL) ×1 IMPLANT
GLOVE BIO SURGEON STRL SZ7.5 (GLOVE) ×2 IMPLANT
GLOVE BIO SURGEON STRL SZ8 (GLOVE) ×2 IMPLANT
GOWN STRL REUS W/ TWL LRG LVL3 (GOWN DISPOSABLE) ×1 IMPLANT
GOWN STRL REUS W/ TWL XL LVL3 (GOWN DISPOSABLE) ×1 IMPLANT
GOWN STRL REUS W/TWL LRG LVL3 (GOWN DISPOSABLE) ×1
GOWN STRL REUS W/TWL XL LVL3 (GOWN DISPOSABLE) ×1
IMMOBILIZER SHDR MD LX WHT (SOFTGOODS) IMPLANT
IMMOBILIZER SHDR XL LX WHT (SOFTGOODS) ×2 IMPLANT
INTRO PACEMAKR LEAD 9FR 13CM (INTRODUCER) ×2
INTRO PACEMKR SHEATH II 7FR (MISCELLANEOUS) ×4
INTRODUCER PACEMKR LD 9FR 13CM (INTRODUCER) ×1 IMPLANT
INTRODUCER PACEMKR SHTH II 7FR (MISCELLANEOUS) ×2 IMPLANT
IPG PACE AZUR XT DR MRI W1DR01 (Pacemaker) ×1 IMPLANT
IV NS 500ML (IV SOLUTION) ×1
IV NS 500ML BAXH (IV SOLUTION) ×1 IMPLANT
KIT TURNOVER KIT A (KITS) ×2 IMPLANT
LABEL OR SOLS (LABEL) ×2 IMPLANT
LEAD CAPSURE NOVUS 5076-52CM (Lead) ×2 IMPLANT
LEAD CAPSURE NOVUS 5076-58CM (Lead) ×2 IMPLANT
MARKER SKIN DUAL TIP RULER LAB (MISCELLANEOUS) ×2 IMPLANT
PACE AZURE XT DR MRI W1DR01 (Pacemaker) ×2 IMPLANT
PACK PACE INSERTION (MISCELLANEOUS) ×2 IMPLANT
PAD ONESTEP ZOLL R SERIES ADT (MISCELLANEOUS) ×2 IMPLANT
PAD TELFA 2X3 NADH STRL (GAUZE/BANDAGES/DRESSINGS) ×2 IMPLANT
STRIP CLOSURE SKIN 1/2X4 (GAUZE/BANDAGES/DRESSINGS) ×2 IMPLANT
SUT SILK 0 SH 30 (SUTURE) ×6 IMPLANT

## 2018-10-13 NOTE — Care Management Obs Status (Signed)
MEDICARE OBSERVATION STATUS NOTIFICATION   Patient Details  Name: Eric Lawrence MRN: [id number] Date of Birth: 01/28/1966   Medicare Observation Status Notification Given:  Yes    Sherren KernsJennifer L Alvetta Hidrogo, RN 10/13/2018, 3:47 PM

## 2018-10-13 NOTE — Op Note (Signed)
[facility] Cardiology   10/13/2018                     1:43 PM  PATIENT:  Eric Lawrence    PRE-OPERATIVE DIAGNOSIS:  SICK SINUS SYNDROME  POST-OPERATIVE DIAGNOSIS:  Same  PROCEDURE:  INSERTION PACEMAKER  SURGEON:  Marcina MillardAlexander Sheretha Shadd, MD    ANESTHESIA:     PREOPERATIVE INDICATIONS:  Eric HindRodney V Taliaferro is a  52 y.o. male with a diagnosis of SICK SINUS SYNDROME who failed conservative measures and elected for surgical management.    The risks benefits and alternatives were discussed with the patient preoperatively including but not limited to the risks of infection, bleeding, cardiopulmonary complications, the need for revision surgery, among others, and the patient was willing to proceed.   OPERATIVE PROCEDURE:  The patient was brought to the operating room in a fasting state.  The left pectoral region was prepped and draped in usual sterile manner.  Anesthesia was obtained 1% lidocaine locally.  A 6 cm incision was performed to the left pectoral region.  The pacemaker pocket was generated by electrocautery and blunt dissection.  Access was obtained the left subclavian vein by fine-needle aspiration.  MRI compatible leads were positioned into the right ventricular apical septum  Medtronic [id number] ) and right atrial appendage ( Medtronic [id number] )  under fluoroscopic guidance.  After proper thresholds were obtained the leads were sutured in place.  The pacemaker pocket was irrigated with gentamicin solution.  The leads were connected to a MRI compatible dual-chamber rate responsive pacemaker generator ( Medtronic [id number] ) and positioned into the pocket.  The pocket was closed with 2-0 and 4-0 Vicryl, respectively.  Steri-Strips and a pressure dressing were applied.  Postprocedural interrogation revealed appropriate dual-chamber atrial and ventricular sensing and pacing thresholds.  There were no periprocedural complications.

## 2018-10-13 NOTE — Plan of Care (Signed)

## 2018-10-13 NOTE — Anesthesia Procedure Notes (Signed)
Date/Time: 10/13/2018 12:01 PM Performed by: Henrietta HooverPope, Chrishawn Boley, CRNA Pre-anesthesia Checklist: Patient identified, Emergency Drugs available, Suction available, Patient being monitored and Timeout performed Patient Re-evaluated:Patient Re-evaluated prior to induction Oxygen Delivery Method: Nasal cannula Placement Confirmation: positive ETCO2

## 2018-10-13 NOTE — H&P (Signed)
Jump to Section ? Discontinued MedicationsDocument InformationECG ResultsEncounter DetailsHistorical MedicationsImaging ResultsLast Filed Vital SignsPatient ContactsPatient DemographicsPatient InstructionsPlan of TreatmentProceduresProgress NotesReason for VisitSocial HistoryVisit Diagnoses Eric Lawrence Encounter Summary, generated on Dec. 05, 2019December 05, 2019 Printout Information  Document Contents Document Received Date Document Source Organization  Initial consult Dec. 05, 2019December 05, 2019 [facility]  Patient Demographics - 52 y.o. Male; born [birth year]   Patient Address Communication Language Race / Ethnicity Marital Status  [address] [place], [place] [address] [phone] [facility]) [phone] (Home) [email or web address] English (Preferred) White / Not Hispanic or Latino Single  Reason for Visit   Reason Comments  Establish Care per Dr Laure Kidney sec pause  Chest Pain pressure  Shortness of Breath 3L O2 at night  Fatigue sleeps all the time  Edema occasionally  Encounter Details   Date Type Department Care Team Description  09/28/2018 Initial consult [facility]  [address]  [place], [place] [address]  [phone]  [name], MD  [address]  [facility]  [place], [place] [address]  [phone]  [phone] (Fax)  Essential hypertension (Primary Dx);  Pulmonary emphysema, unspecified emphysema type (CMS-HCC);  Sleep apnea, unspecified type;  Pure hypercholesterolemia;  AV dissociation;  Sinus pause  Social History - documented as of this encounter  Tobacco Use Types Packs/Day Years Used Date  Current Every Day Smoker      Smokeless Tobacco: Never Used      Sex Assigned at [employer] Date Recorded  Not on file    Job Start Date Occupation Industry  Not on file Not on file Not on file   Travel History Travel Start Travel End    No recent travel history available.    Last Filed Vital Signs - documented in this encounter  Vital Sign Reading Time Taken Comments  Blood Pressure 132/80 09/28/2018 9:18 AM EST   Pulse 76 09/28/2018 9:18 AM EST   Temperature - -   Respiratory Rate - -   Oxygen Saturation - -   Inhaled Oxygen Concentration - -   Weight 123.6 kg (272 lb 7.8 oz) 09/28/2018 9:18 AM EST   Height 188 cm (6\' 2" ) 09/28/2018 9:18 AM EST   Body Mass Index 34.99 09/28/2018 9:18 AM EST   Patient Instructions - documented in this encounter  Patient Instructions [name], MD - 09/28/2018 9:00 AM EST   Patient Education    DASH Diet: Care Instructions Your Care Instructions  The DASH diet is an eating plan that can help lower your blood pressure. DASH stands for Dietary Approaches to Stop Hypertension. Hypertension is high blood pressure. The DASH diet focuses on eating foods that are high in calcium, potassium, and magnesium. These nutrients can lower blood pressure. The foods that are highest in these nutrients are fruits, vegetables, low-fat dairy products, nuts, seeds, and legumes. But taking calcium, potassium, and magnesium supplements instead of eating foods that are high in those nutrients does not have the same effect. The DASH diet also includes whole grains, fish, and poultry. The DASH diet is one of several lifestyle changes your doctor may recommend to lower your high blood pressure. Your doctor may also want you to decrease the amount of sodium in your diet. Lowering sodium while following the DASH diet can lower blood pressure even further than just the DASH diet alone. Follow-up care is a key part of your treatment and safety. Be sure to make and go to all appointments, and  call your doctor if you are having problems. It's also a good idea to know your test results and keep a list of the medicines you take. How can you care for yourself at home? Following the DASH diet  Eat  4 to 5 servings of fruit each day. A serving is 1 medium-sized piece of fruit,  cup chopped or canned fruit, 1/4 cup dried fruit, or 4 ounces ( cup) of fruit juice. Choose fruit more often than fruit juice.  Eat 4 to 5 servings of vegetables each day. A serving is 1 cup of lettuce or raw leafy vegetables,  cup of chopped or cooked vegetables, or 4 ounces ( cup) of vegetable juice. Choose vegetables more often than vegetable juice.  Get 2 to 3 servings of low-fat and fat-free dairy each day. A serving is 8 ounces of milk, 1 cup of yogurt, or 1  ounces of cheese.  Eat 6 to 8 servings of grains each day. A serving is 1 slice of bread, 1 ounce of dry cereal, or  cup of cooked rice, pasta, or cooked cereal. Try to choose whole-grain products as much as possible.  Limit lean meat, poultry, and fish to 2 servings each day. A serving is 3 ounces, about the size of a deck of cards.  Eat 4 to 5 servings of nuts, seeds, and legumes (cooked dried beans, lentils, and split peas) each week. A serving is 1/3 cup of nuts, 2 tablespoons of seeds, or  cup of cooked beans or peas.  Limit fats and oils to 2 to 3 servings each day. A serving is 1 teaspoon of vegetable oil or 2 tablespoons of salad dressing.  Limit sweets and added sugars to 5 servings or less a week. A serving is 1 tablespoon jelly or jam,  cup sorbet, or 1 cup of lemonade.  Eat less than 2,300 milligrams (mg) of sodium a day. If you limit your sodium to 1,500 mg a day, you can lower your blood pressure even more. Tips for success  Start small. Do not try to make dramatic changes to your diet all at once. You might feel that you are missing out on your favorite foods and then be more likely to not follow the plan. Make small changes, and stick with them. Once those changes become habit, add a few more changes.  Try some of the following: ? Make it a goal to eat a fruit or vegetable at every meal and at snacks. This will make it easy to get  the recommended amount of fruits and vegetables each day. ? Try yogurt topped with fruit and nuts for a snack or healthy dessert. ? Add lettuce, tomato, cucumber, and onion to sandwiches. ? Combine a ready-made pizza crust with low-fat mozzarella cheese and lots of vegetable toppings. Try using tomatoes, squash, spinach, broccoli, carrots, cauliflower, and onions. ? Have a variety of cut-up vegetables with a low-fat dip as an appetizer instead of chips and dip. ? Sprinkle sunflower seeds or chopped almonds over salads. Or try adding chopped walnuts or almonds to cooked vegetables. ? Try some vegetarian meals using beans and peas. Add garbanzo or kidney beans to salads. Make burritos and tacos with mashed pinto beans or black beans. Where can you learn more? Log in to your [facility] My[facility] account at [email or web address] and click on top menu option "Health" then select "Search Medical Library". Enter [phone] in the search box and click the magnify glass to learn more about "DASH Diet: Care  Instructions." Current as of: February 09, 2018 Content Version: 12.2  2006-2019 [employer]. Care instructions adapted under license by your healthcare professional. If you have questions about a medical condition or this instruction, always ask your healthcare professional. [employer], Incorporated disclaims any warranty or liability for your use of this information.     Patient Education    Learning About High Cholesterol What is high cholesterol?  Cholesterol is a type of fat in your blood. It is needed for many body functions, such as making new cells. Cholesterol is made by your body. It also comes from food you eat. If you have too much cholesterol, it starts to build up in your arteries. This is called hardening of the arteries, or atherosclerosis. High cholesterol raises your risk of a heart attack and stroke. There are different types of cholesterol. LDL is the "bad" cholesterol. High  LDL can raise your risk for heart disease, heart attack, and stroke. HDL is the "good" cholesterol. High HDL is linked with a lower risk for heart disease, heart attack, and stroke. Your cholesterol levels help your doctor find out your risk for having a heart attack or stroke. How can you prevent high cholesterol? A heart-healthy lifestyle can help you prevent high cholesterol. This lifestyle helps lower your risk for a heart attack and stroke.  Eat heart-healthy foods. ? Eat fruits, vegetables, whole grains (like oatmeal), dried beans and peas, nuts and seeds, soy products (like tofu), and fat-free or low-fat dairy products. ? Replace butter, margarine, and hydrogenated or partially hydrogenated oils with olive and canola oils. (Canola oil margarine without trans fat is fine.) ? Replace red meat with fish, poultry, and soy protein (like tofu). ? Limit processed and packaged foods like chips, crackers, and cookies.  Be active. Exercise can improve your cholesterol level. Get at least 30 minutes of exercise on most days of the week. Walking is a good choice. You also may want to do other activities, such as running, swimming, cycling, or playing tennis or team sports.  Stay at a healthy weight. Lose weight if you need to.  Don't smoke. If you need help quitting, talk to your doctor about stop-smoking programs and medicines. These can increase your chances of quitting for good. How is high cholesterol treated? The goal of treatment is to reduce your chances of having a heart attack or stroke. The goal is not to lower your cholesterol numbers only.  You may make lifestyle changes, such as eating healthy foods, not smoking, losing weight, and being more active.  You may have to take medicine. Follow-up care is a key part of your treatment and safety. Be sure to make and go to all appointments, and call your doctor if you are having problems. It's also a good idea to know your test results and keep  a list of the medicines you take. Where can you learn more? Log in to your [facility] MyChart account at [email or web address] and click on top menu option "Health" then select "Search Medical Library". Enter [phone] in the search box and click the magnify glass to learn more about "Learning About High Cholesterol." Current as of: February 09, 2018 Content Version: 12.2  2006-2019 [employer]wise, Inc[employer]. Care instructions adapted under license by your healthcare professional. If you have questions about a medical condition or this instruction, always ask your healthcare professional. [employer], Inc[employer] disclaims any warranty or liability for your use of this information.      Electronically signed by [name], MD at  09/28/2018 9:32 AM EST    Progress Notes - documented in this encounter  [name], MD - 09/28/2018 9:00 AM EST Formatting of this note might be different from the original. New Patient Visit   Chief Complaint: Chief Complaint  Patient presents with  . Establish Care  per Dr Laure Kidney sec pause  . Chest Pain  pressure  . Shortness of Breath  3L O2 at night  . Fatigue  sleeps all the time  . Edema  occasionally  Date of Service: 09/28/2018 Date of Birth: April 19, 1966 PCP: Smitty Cords, DO  History of Present Illness: Mr. Grainger is a 52 y.o.male patient who is referred for evaluation of sinus pauses. The patient reports occasional episodes of chest discomfort, described as pressure sensation. He has chronic exertional dyspnea due to underlying COPD which is unchanged. He quit smoking 3 weeks ago. He reports occasional palpitations. He has intermittent peripheral edema. The patient has intermittent dizziness with presyncope without full loss of consciousness. He does complain of generalized fatigue. Recent Holter monitor revealed multiple sinus pauses up to 3.5 seconds. The patient underwent recent hernia repair at which time he experienced  transient intermittent third-degree AV block peri-and postoperatively. He underwent Persantine sestamibi study 09/13/2018 which revealed LVEF of 63% with a small apical wall defect.  Patient has essential hypertension, blood pressure well controlled, on lisinopril/HCTZ and amlodipine, which are well-tolerated without apparent side effects. The patient follows a low-sodium, no added salt diet.  Past Medical and Surgical History  Past Medical History Past Medical History:  Diagnosis Date  . Asthma without status asthmaticus, unspecified  . COPD (chronic obstructive pulmonary disease) (CMS-HCC)  . Depression  . Hypertension  . Sleep apnea   Past Surgical History He has no past surgical history on file.   Medications and Allergies  Current Medications Current Outpatient Medications  Medication Sig Dispense Refill  . albuterol 90 mcg/actuation inhaler Inhale into the lungs  . amLODIPine (NORVASC) 10 MG tablet Take by mouth  . buPROPion (WELLBUTRIN XL) 300 MG XL tablet Take 300 mg by mouth once daily  . lisinopril-hydrochlorothiazide (ZESTORETIC) 20-25 mg tablet Take 1 tablet by mouth once daily  . multivitamin tablet Take 1 tablet by mouth once daily  . nicotine (NICODERM CQ) 21 mg/24 hr patch Place 1 patch onto the skin daily  . oxyCODONE-acetaminophen (PERCOCET) 5-325 mg tablet Take by mouth  . rosuvastatin (CRESTOR) 10 MG tablet Take 10 mg by mouth once daily   No current facility-administered medications for this visit.   Allergies Patient has no known allergies.  Social and Family History  Social History reports that he has been smoking. He has never used smokeless tobacco.  Family History family history includes Diabetes type II in his mother.   Review of Systems   Review of Systems: The patient denies chest pain, shortness of breath, orthopnea, paroxysmal nocturnal dyspnea, pedal edema, palpitations, heart racing, presyncope, syncope. Review of 12 Systems is negative  except as described above.  Physical Examination   Vitals:BP 132/80  Pulse 76  Ht 188 cm (6\' 2" )  Wt (!) 123.6 kg (272 lb 7.8 oz)  BMI 34.99 kg/m  Ht:188 cm (6\' 2" ) Wt:(!) 123.6 kg (272 lb 7.8 oz) ZOX:WRUE surface area is 2.54 meters squared. Body mass index is 34.99 kg/m.  HEENT: Pupils equally reactive to light and accomodation  Neck: Supple without thyromegaly, carotid pulses 2+ Lungs: clear to auscultation bilaterally; no wheezes, rales, rhonchi Heart: Regular rate and rhythm. No gallops, murmurs  or rub Abdomen: soft nontender, nondistended, with normal bowel sounds Extremities: no cyanosis, clubbing, or edema Peripheral Pulses: 2+ in all extremities, 2+ femoral pulses bilaterally  Assessment   52 y.o. male with  1. Essential hypertension  2. Pulmonary emphysema, unspecified emphysema type (CMS-HCC)  3. Sleep apnea, unspecified type  4. Pure hypercholesterolemia  5. AV dissociation  6. Sinus pause   52 year old gentleman with symptomatic sinus pauses, with generalized fatigue and presyncope. The patient has intermittent chest pain, with borderline abnormal Persantine sestamibi study. Patient has essential hypertension, blood pressure well controlled on current BP medications.  Plan   1. Continue current medications 2. Counseled patient about low-sodium diet 3. DASH diet printed instructions given to the patient 4. Counseled patient about low-cholesterol diet next #5 low-fat and cholesterol diet printed instructions given to the patient 6. Proceed with dual-chamber pacemaker implantation. The risk, benefits and alternatives of pacemaker implantation were explained to the patient and informed written consent was obtained.  No orders of the defined types were placed in this encounter.  Return in about 2 weeks (around 10/12/2018), or after pacemaker.  [name], MD PhD [facility]    Electronically signed by [name], MD at 09/28/2018 9:45 AM EST    Plan of Treatment - documented as of this encounter  Not on file  Procedures - documented in this encounter  Procedure Name Priority Date/Time Associated Diagnosis Comments  ECG  09/15/2018 12:00 AM EST  Results for this procedure are in the results section.   ECHO - STRESS EXTERNAL  09/13/2018 12:00 AM EST  Results for this procedure are in the results section.   Imaging Results - documented in this encounter   ECHO - STRESS EXTERNAL (09/13/2018 12:00 AM EST) ECHO - STRESS EXTERNAL (09/13/2018 12:00 AM EST)  Narrative Performed At  This result has an attachment that is not available.  Ordered by an unspecified provider.      ECG Results - documented in this encounter   ECG (09/15/2018 12:00 AM EST) ECG (09/15/2018 12:00 AM EST)  Narrative Performed At  This result has an attachment that is not available.  Ordered by an unspecified provider.      Visit Diagnoses - documented in this encounter  Diagnosis  Essential hypertension - Primary   Pulmonary emphysema, unspecified emphysema type (CMS-HCC)   Sleep apnea, unspecified type   Pure hypercholesterolemia   AV dissociation  Other specified conduction disorder   Sinus pause   Discontinued Medications - documented as of this encounter  Medication Sig Discontinue Reason Start Date End Date  lisinopril (PRINIVIL,ZESTRIL) 40 MG tablet  Take by mouth DC'd other provider  09/28/2018  atomoxetine (STRATTERA) 25 MG capsule  Take by mouth DC'd other provider  09/28/2018  atomoxetine (STRATTERA) 25 MG capsule  TAKE ONE CAPSULE BY MOUTH EVERY EVENING DC'd other provider 09/02/2017 09/28/2018  FLUoxetine (PROZAC) 20 MG tablet  Take by mouth DC'd other provider  09/28/2018  hydroCHLOROthiazide (HYDRODIURIL) 25 MG tablet  Take by mouth DC'd other provider  09/28/2018  Historical Medications - added in this encounter Reconcile with Patient's Chart  This list may reflect changes made after this  encounter.  Medication Sig Dispensed Refills Start Date End Date  nicotine (NICODERM CQ) 21 mg/24 hr patch  Place 1 patch onto the skin daily  0    buPROPion (WELLBUTRIN XL) 300 MG XL tablet  Take 300 mg by mouth once daily  0    multivitamin tablet  Take 1 tablet by mouth once  daily  0    rosuvastatin (CRESTOR) 10 MG tablet  Take 10 mg by mouth once daily  0    lisinopril-hydrochlorothiazide (ZESTORETIC) 20-25 mg tablet  Take 1 tablet by mouth once daily  0    Images Patient Contacts   Contact Name Contact Address Communication Relationship to Patient  Carlynn Spry Unknown [phone] [facility]) [phone] (Mobile) Parent, Emergency Contact  Document Information  Primary Care Provider Other Service Providers Document Coverage Dates  Smitty Cords, DO (Jun. 15, 2018June 15, 2018 - Present) DM: [phone] (Work) [phone] (Fax) [address] [place], [place] [address] [facility] [address] [place], [place] [address]  Nov. 26, 2019November 26, 2019 - Nov. 30, 2019November 30, 2019   Custodian Organization  [facility] [address] [place], [place] [address]   Encounter Providers Encounter Date  [name], MD (Attending) DM: [phone] (Work) [phone] (Fax) [address] [facility] [place], [place] [address] Cardiovascular Disease Nov. 26, 2019November 26, 2019 - Nov. 30, 2019November 30, 2019    Show All Sections

## 2018-10-13 NOTE — Anesthesia Post-op Follow-up Note (Signed)
Anesthesia QCDR form completed.        

## 2018-10-13 NOTE — Interval H&P Note (Signed)
History and Physical Interval Note:  10/13/2018 11:56 AM  Eric Lawrence  has presented today for surgery, with the diagnosis of SICK SINUS SYNDROME  The various methods of treatment have been discussed with the patient and family. After consideration of risks, benefits and other options for treatment, the patient has consented to  Procedure(s): INSERTION PACEMAKER (Left) as a surgical intervention .  The patient's history has been reviewed, patient examined, no change in status, stable for surgery.  I have reviewed the patient's chart and labs.  Questions were answered to the patient's satisfaction.     Bethene Hankinson [employer]

## 2018-10-13 NOTE — Anesthesia Preprocedure Evaluation (Signed)
Anesthesia Evaluation  Patient identified by MRN, date of birth, ID band Patient awake    Reviewed: Allergy & Precautions, NPO status , Patient's Chart, lab work & pertinent test results  History of Anesthesia Complications Negative for: history of anesthetic complications  Airway Mallampati: II  TM Distance: >3 FB Neck ROM: Full    Dental  (+) Missing, Poor Dentition   Pulmonary asthma , sleep apnea , COPD,  COPD inhaler, former smoker,    breath sounds clear to auscultation- rhonchi (-) wheezing      Cardiovascular hypertension, Pt. on medications (-) CAD, (-) Past MI, (-) Cardiac Stents and (-) CABG + dysrhythmias (SSS)  Rhythm:Regular Rate:Normal - Systolic murmurs and - Diastolic murmurs    Neuro/Psych PSYCHIATRIC DISORDERS Anxiety Depression negative neurological ROS     GI/Hepatic Neg liver ROS, GERD  ,  Endo/Other  negative endocrine ROSneg diabetes  Renal/GU negative Renal ROS     Musculoskeletal  (+) Arthritis ,   Abdominal (+) + obese,   Peds  Hematology negative hematology ROS (+)   Anesthesia Other Findings Past Medical History: 06/29/2017: Acute cholecystitis No date: ADHD 04/19/2015: Affective disorder, major 04/19/2015: Airway hyperreactivity No date: Anxiety 04/19/2015: Anxiety disorder 04/19/2015: Arthritis 04/19/2015: At risk for injury 04/19/2015: CAFL (chronic airflow limitation) (HCC) 06/29/2017: Cholecystitis 04/19/2015: Chronic gastritis 04/19/2015: Compulsive tobacco user syndrome No date: COPD (chronic obstructive pulmonary disease) (HCC) No date: Depression No date: Dysrhythmia 04/19/2015: Encounter for immunization No date: GERD (gastroesophageal reflux disease) 04/19/2015: H/O elevated lipids No date: Hypertension 04/19/2015: Mechanical and motor problems with internal organs 09/10/2015: Obesity No date: Oxygen deficiency No date: Sleep apnea 04/15/2017: Trigger thumb of left hand   Reproductive/Obstetrics                             Anesthesia Physical Anesthesia Plan  ASA: III  Anesthesia Plan: General   Post-op Pain Management:    Induction: Intravenous  PONV Risk Score and Plan: 1 and Propofol infusion  Airway Management Planned: Natural Airway  Additional Equipment:   Intra-op Plan:   Post-operative Plan:   Informed Consent: I have reviewed the patients History and Physical, chart, labs and discussed the procedure including the risks, benefits and alternatives for the proposed anesthesia with the patient or authorized representative who has indicated his/her understanding and acceptance.   Dental advisory given  Plan Discussed with: CRNA and Anesthesiologist  Anesthesia Plan Comments:         Anesthesia Quick Evaluation

## 2018-10-13 NOTE — Transfer of Care (Addendum)
Immediate Anesthesia Transfer of Care Note  Patient: Eric Lawrence  Procedure(s) Performed: INSERTION PACEMAKER (Left Chest)  Patient Location: PACU  Anesthesia Type:MAC  Level of Consciousness: awake and alert   Airway & Oxygen Therapy: Patient Spontanous Breathing and Patient connected to nasal cannula oxygen  Post-op Assessment: Report given to RN and Post -op Vital signs reviewed and stable  Post vital signs: Reviewed and stable  Last Vitals:  Vitals Value Taken Time  BP    Temp    Pulse    Resp    SpO2      Last Pain:  Vitals:   10/13/18 1057  TempSrc: Oral  PainSc: 0-No pain         Complications: No apparent anesthesia complications

## 2018-10-14 DIAGNOSIS — G4730 Sleep apnea, unspecified: Secondary | ICD-10-CM | POA: Diagnosis not present

## 2018-10-14 DIAGNOSIS — I10 Essential (primary) hypertension: Secondary | ICD-10-CM | POA: Diagnosis not present

## 2018-10-14 DIAGNOSIS — J449 Chronic obstructive pulmonary disease, unspecified: Secondary | ICD-10-CM | POA: Diagnosis not present

## 2018-10-14 DIAGNOSIS — E7800 Pure hypercholesterolemia, unspecified: Secondary | ICD-10-CM | POA: Diagnosis not present

## 2018-10-14 DIAGNOSIS — I4589 Other specified conduction disorders: Secondary | ICD-10-CM | POA: Diagnosis not present

## 2018-10-14 DIAGNOSIS — I495 Sick sinus syndrome: Secondary | ICD-10-CM | POA: Diagnosis not present

## 2018-10-14 MED ORDER — CEPHALEXIN 250 MG PO CAPS: 500.0000 mg | ORAL_CAPSULE | Freq: Two times a day (BID) | ORAL | 0 refills | Status: DC

## 2018-10-14 NOTE — Care Management (Signed)
[facility] is a potential Code 6044 (meaning he changed from inpatient to OBS status at some point in his stay).as he was listed on code [id number] print out list this AM.

## 2018-10-14 NOTE — Progress Notes (Signed)
Pt discharged to home via wc.  Instructions and rx given to pt.  Questions answered.  No distress.  

## 2018-10-14 NOTE — Discharge Instructions (Signed)
Do not lift left arm above head.  May remove outer bandage 10/15/2018.  Leave Steri-Strips on.  Patient may shower on 10/15/2018.

## 2018-10-14 NOTE — Discharge Summary (Signed)
   Physician Discharge Summary  Patient ID: Eric Lawrence MRN: [id number] DOB/AGE: 54/03/1966 54 y.o.  Admit date: 10/13/2018 Discharge date: 10/14/2018  Primary Discharge Diagnosis bradycardia Secondary Discharge Diagnosis sick sinus syndrome  Significant Diagnostic Studies: yes  Consults: None  Lawrence Course: The patient is a 52 year old gentleman with history of symptomatic sinus pauses.  He underwent elective dual-chamber pacemaker implantation on 10/13/2018.  He was returned to telemetry where he an uncomplicated Lawrence course.  Postoperative chest x-ray did not reveal evidence for pneumothorax.  Morning of 02/12/2018, the patient was ambulating without difficulty and was discharged home.   Discharge Exam: Blood pressure (!) 163/98, pulse 73, temperature (!) 97.5 F (36.4 C), temperature source Oral, resp. rate 18, height 6\' 2"  (1.88 m), weight 120.3 kg, SpO2 96 %.   General appearance: alert Head: Normocephalic, without obvious abnormality, atraumatic Eyes: conjunctivae/corneas clear. PERRL, EOM's intact. Fundi benign. Ears: normal TM's and external ear canals both ears Nose: Nares normal. Septum midline. Mucosa normal. No drainage or sinus tenderness. Throat: lips, mucosa, and tongue normal; teeth and gums normal Neck: no adenopathy, no carotid bruit, no JVD, supple, symmetrical, trachea midline and thyroid not enlarged, symmetric, no tenderness/mass/nodules Back: symmetric, no curvature. ROM normal. No CVA tenderness. Resp: clear to auscultation bilaterally Chest wall: no tenderness Cardio: regular rate and rhythm, S1, S2 normal, no murmur, click, rub or gallop Extremities: extremities normal, atraumatic, no cyanosis or edema Pulses: 2+ and symmetric Skin: Skin color, texture, turgor normal. No rashes or lesions Lymph nodes: Cervical, supraclavicular, and axillary nodes normal. Neurologic: Grossly normal Incision/Wound: Well-healing Labs:   Lab Results  Component  Value Date   WBC 7.4 10/07/2018   HGB 15.3 10/07/2018   HCT 43.9 10/07/2018   MCV 88.7 10/07/2018   PLT 181 10/07/2018    Recent Labs  Lab 10/07/18 1142  NA 140  K 3.8  CL 104  CO2 26  BUN 13  CREATININE 0.98  CALCIUM 9.3  GLUCOSE 162*      Radiology: No pneumothorax EKG: Sinus rhythm  FOLLOW UP PLANS AND APPOINTMENTS     BRING ALL MEDICATIONS WITH YOU TO FOLLOW UP APPOINTMENTS  Time spent with patient to include physician time: 25 minutes Signed:  Marcina MillardAlexander Yamel Bale MD, PhD, [facility] 10/14/2018, 7:44 AM

## 2018-10-14 NOTE — Anesthesia Postprocedure Evaluation (Signed)
Anesthesia Post Note  Patient: Eric Lawrence  Procedure(s) Performed: INSERTION PACEMAKER (Left Chest)  Patient location during evaluation: PACU Anesthesia Type: General Level of consciousness: awake and alert and oriented Pain management: pain level controlled Vital Signs Assessment: post-procedure vital signs reviewed and stable Respiratory status: spontaneous breathing, nonlabored ventilation and respiratory function stable Cardiovascular status: blood pressure returned to baseline and stable Postop Assessment: no signs of nausea or vomiting Anesthetic complications: no     Last Vitals:  Vitals:   10/14/18 0329 10/14/18 0753  BP: (!) 163/98 (!) 158/107  Pulse: 73 71  Resp: 18 20  Temp: (!) 36.4 C 36.4 C  SpO2: 96% 93%    Last Pain:  Vitals:   10/14/18 0839  TempSrc:   PainSc: 0-No pain                 Kira Hartl

## 2018-10-14 NOTE — Progress Notes (Signed)
Patient is postop day 1 PPM on the left upper chest. Patient has no acute event overnight remained in NSR in the 80s, with occasional atrial pacing in the 70s. Patient's surgical site remained dry and intact with marked old drainage. Pain controlled with PRN pain med.

## 2018-10-15 DIAGNOSIS — I442 Atrioventricular block, complete: Secondary | ICD-10-CM | POA: Diagnosis not present

## 2018-10-18 DIAGNOSIS — J449 Chronic obstructive pulmonary disease, unspecified: Secondary | ICD-10-CM | POA: Diagnosis not present

## 2018-10-18 DIAGNOSIS — I720 Aneurysm of carotid artery: Secondary | ICD-10-CM | POA: Diagnosis not present

## 2018-10-25 DIAGNOSIS — I10 Essential (primary) hypertension: Secondary | ICD-10-CM | POA: Diagnosis not present

## 2018-10-25 DIAGNOSIS — I4589 Other specified conduction disorders: Secondary | ICD-10-CM | POA: Diagnosis not present

## 2018-10-25 DIAGNOSIS — I495 Sick sinus syndrome: Secondary | ICD-10-CM | POA: Diagnosis not present

## 2018-10-25 DIAGNOSIS — G4730 Sleep apnea, unspecified: Secondary | ICD-10-CM | POA: Diagnosis not present

## 2018-10-25 DIAGNOSIS — I455 Other specified heart block: Secondary | ICD-10-CM | POA: Diagnosis not present

## 2018-10-28 DIAGNOSIS — I10 Essential (primary) hypertension: Secondary | ICD-10-CM | POA: Diagnosis not present

## 2018-10-28 DIAGNOSIS — Z950 Presence of cardiac pacemaker: Secondary | ICD-10-CM | POA: Diagnosis not present

## 2018-10-28 DIAGNOSIS — I442 Atrioventricular block, complete: Secondary | ICD-10-CM | POA: Diagnosis not present

## 2018-10-28 DIAGNOSIS — E782 Mixed hyperlipidemia: Secondary | ICD-10-CM | POA: Diagnosis not present

## 2018-11-09 ENCOUNTER — Ambulatory Visit (INDEPENDENT_AMBULATORY_CARE_PROVIDER_SITE_OTHER): Payer: Medicare Other | Admitting: Physician Assistant

## 2018-11-09 ENCOUNTER — Encounter: Payer: Self-pay | Admitting: Physician Assistant

## 2018-11-09 VITALS — BP 170/107 | HR 87 | Temp 98.0°F | Resp 16 | Wt 271.0 lb

## 2018-11-09 DIAGNOSIS — J410 Simple chronic bronchitis: Secondary | ICD-10-CM

## 2018-11-09 DIAGNOSIS — I10 Essential (primary) hypertension: Secondary | ICD-10-CM

## 2018-11-09 MED ORDER — FLUTICASONE-SALMETEROL 250-50 MCG/DOSE IN AEPB: 1.0000 | INHALATION_SPRAY | Freq: Two times a day (BID) | RESPIRATORY_TRACT | 3 refills | Status: DC

## 2018-11-09 MED ORDER — HYDROCHLOROTHIAZIDE 25 MG PO TABS: 25.0000 mg | ORAL_TABLET | Freq: Every day | ORAL | 3 refills | Status: DC

## 2018-11-09 MED ORDER — LISINOPRIL 40 MG PO TABS: 40.0000 mg | ORAL_TABLET | Freq: Every day | ORAL | 3 refills | Status: DC

## 2018-11-09 NOTE — Progress Notes (Signed)
Patient: Eric Lawrence Male    DOB: 1966/10/06   53 y.o.   MRN: [id number] Visit Date: 11/10/2018  Today's Provider: Trey Sailors, PA-C   Chief Complaint  Patient presents with  . Follow-up   Subjective:     HPI Patient here to f/u from surgery on 10/13/18 for pacemaker with Dr. Darrold Junker. 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 Dr. Darrold Junker placed a dual chamber pacemaker in December. Patient reports he is feeling well but that he will have intermittent pinching sensation around pacemaker site. Patient is requesting a handicap form be filled out today. He continues to follow with Dr. Welton Flakes in cardiology.   HTN: Patient currently taking 10 mg amlodipine and 20 mg lisinopril with 25 mg hctz. He reports that extra  Lisinopril 20 mg was not received and reports that he has only been taking lisinopril-hydrochlorothiazide 20-25 mg daily.  Anxiety and depression: He continues to see Dr. Lynett Fish.   COPD: He reports a history of being diagnosed with COPD when he saw Dr. Juanetta Gosling. He reports previously being on advair but he stopped, uncertain why. He reports continued symptoms of SOB, wheezing. He has quit smoking.   No Known Allergies   Current Outpatient Medications:  .  acetaminophen (TYLENOL) 325 MG tablet, Take 325 mg by mouth daily. , Disp: , Rfl:  .  albuterol (PROVENTIL HFA;VENTOLIN HFA) 108 (90 Base) MCG/ACT inhaler, Inhale 2 puffs every 4 (four) hours as needed into the lungs for wheezing or shortness of breath., Disp: 1 Inhaler, Rfl: 3 .  amLODipine (NORVASC) 10 MG tablet, TAKE ONE TABLET BY MOUTH EVERY DAY (Patient taking differently: Take 10 mg by mouth daily. ), Disp: 30 tablet, Rfl: 0 .  buPROPion (WELLBUTRIN XL) 300 MG 24 hr tablet, Take 300 mg by mouth daily. , Disp: , Rfl:  .  ibuprofen (ADVIL,MOTRIN) 600 MG tablet, Take 1 tablet (600 mg total) by mouth every 8 (eight) hours as needed for mild pain or moderate pain.,  Disp: 30 tablet, Rfl: 0 .  Multiple Vitamin (MULTIVITAMIN WITH MINERALS) TABS tablet, Take 1 tablet by mouth daily., Disp: , Rfl:  .  nicotine (NICODERM CQ - DOSED IN MG/24 HOURS) 21 mg/24hr patch, Place 21 mg onto the skin daily. , Disp: , Rfl: 3 .  OXYGEN, Inhale 3 L into the lungs at bedtime. , Disp: , Rfl:  .  rosuvastatin (CRESTOR) 10 MG tablet, Take 1 tablet (10 mg total) by mouth daily., Disp: 90 tablet, Rfl: 3 .  Fluticasone-Salmeterol (ADVAIR DISKUS) 250-50 MCG/DOSE AEPB, Inhale 1 puff into the lungs 2 (two) times daily., Disp: 1 each, Rfl: 3 .  hydrochlorothiazide (HYDRODIURIL) 25 MG tablet, Take 1 tablet (25 mg total) by mouth daily., Disp: 90 tablet, Rfl: 3 .  lisinopril (PRINIVIL,ZESTRIL) 40 MG tablet, Take 1 tablet (40 mg total) by mouth daily., Disp: 90 tablet, Rfl: 3  Review of Systems  Constitutional: Negative.   Respiratory: Negative.   Cardiovascular: Negative.     Social History   Tobacco Use  . Smoking status: Former Smoker    Packs/day: 0.25    Types: Cigarettes  . Smokeless tobacco: Never Used  Substance Use Topics  . Alcohol use: Not Currently    Alcohol/week: 0.0 standard drinks      Objective:   BP (!) 170/107 (BP Location: Left Arm, Patient Position: Sitting, Cuff Size: Normal)   Pulse 87   Temp  98 F (36.7 C) (Oral)   Resp 16   Wt 271 lb (122.9 kg)   SpO2 97%   BMI 34.79 kg/m  Vitals:   11/09/18 1107  BP: (!) 170/107  Pulse: 87  Resp: 16  Temp: 98 F (36.7 C)  TempSrc: Oral  SpO2: 97%  Weight: 271 lb (122.9 kg)     Physical Exam Constitutional:      Appearance: Normal appearance.  Cardiovascular:     Rate and Rhythm: Normal rate and regular rhythm.     Heart sounds: Normal heart sounds.  Pulmonary:     Effort: Pulmonary effort is normal.     Breath sounds: Normal breath sounds.  Skin:    General: Skin is warm and dry.  Neurological:     Mental Status: He is alert. Mental status is at baseline.  Psychiatric:        Mood and  Affect: Mood normal.        Behavior: Behavior normal.         Assessment & Plan    1. Essential hypertension  HTN significantly elevated today though previous visits with Dr. Darrold Junker only slightly elevated. Will refill as below and recheck in 2 weeks.   - lisinopril (PRINIVIL,ZESTRIL) 40 MG tablet; Take 1 tablet (40 mg total) by mouth daily.  Dispense: 90 tablet; Refill: 3 - hydrochlorothiazide (HYDRODIURIL) 25 MG tablet; Take 1 tablet (25 mg total) by mouth daily.  Dispense: 90 tablet; Refill: 3  2. Simple chronic bronchitis (HCC)  Restart advair as below.  - Fluticasone-Salmeterol (ADVAIR DISKUS) 250-50 MCG/DOSE AEPB; Inhale 1 puff into the lungs 2 (two) times daily.  Dispense: 1 each; Refill: 3  Return in about 2 weeks (around 11/23/2018) for HTN .  The entirety of the information documented in the History of Present Illness, Review of Systems and Physical Exam were personally obtained by me. Portions of this information were initially documented by Rondel Baton, CMA and reviewed by me for thoroughness and accuracy.         Trey Sailors, PA-C  [facility]

## 2018-11-10 NOTE — Patient Instructions (Signed)

## 2018-11-18 DIAGNOSIS — I720 Aneurysm of carotid artery: Secondary | ICD-10-CM | POA: Diagnosis not present

## 2018-11-18 DIAGNOSIS — J449 Chronic obstructive pulmonary disease, unspecified: Secondary | ICD-10-CM | POA: Diagnosis not present

## 2018-11-23 ENCOUNTER — Encounter: Payer: Self-pay | Admitting: Physician Assistant

## 2018-11-23 ENCOUNTER — Ambulatory Visit (INDEPENDENT_AMBULATORY_CARE_PROVIDER_SITE_OTHER): Payer: Medicare Other | Admitting: Physician Assistant

## 2018-11-23 VITALS — BP 185/105 | HR 80 | Temp 97.7°F | Resp 16 | Wt 271.0 lb

## 2018-11-23 DIAGNOSIS — R29818 Other symptoms and signs involving the nervous system: Secondary | ICD-10-CM

## 2018-11-23 DIAGNOSIS — J410 Simple chronic bronchitis: Secondary | ICD-10-CM | POA: Diagnosis not present

## 2018-11-23 DIAGNOSIS — I10 Essential (primary) hypertension: Secondary | ICD-10-CM | POA: Diagnosis not present

## 2018-11-23 DIAGNOSIS — I495 Sick sinus syndrome: Secondary | ICD-10-CM

## 2018-11-23 DIAGNOSIS — I1A0 Resistant hypertension: Secondary | ICD-10-CM

## 2018-11-23 MED ORDER — TRIAMTERENE-HCTZ 37.5-25 MG PO TABS: 1.0000 | ORAL_TABLET | Freq: Every day | ORAL | 3 refills | Status: DC

## 2018-11-23 NOTE — Progress Notes (Signed)
Patient: Eric Lawrence Male    DOB: [birth year]   53 y.o.   MRN: [id number] Visit Date: 11/23/2018  Today's Provider: Trey Sailors, PA-C   Chief Complaint  Patient presents with  . Hypertension   Subjective:     HPI  Follow up for hypertension  The patient was last seen for this 2 weeks ago. Changes made at last visit include increasing Lisinopril to 40 mg daily. He is currently taking 10 mg amlodipine, 40 mg lisinopril nad 25 mg HCTZ. He has a history of 3rd degree heart block and sick sinus syndrome and had a dual chamber pacemaker placed in December by Dr. Darrold Junker. He is followed routinely by Dr. Park Breed. He reports he has appointments in May of this year to interrogate his pacemaker with Dr. Park Breed.   He checks his blood pressure at home and reports it is high. He denies chest pain, pressure, SOB.   He reports excellent compliance with treatment. He feels that condition is Unchanged. He is not having side effects.   BP Readings from Last 3 Encounters:  11/23/18 (!) 185/105  11/09/18 (!) 170/107  10/14/18 (!) 158/107   Sleep Apnea  He reports he was followed by Dr. Juanetta Gosling and previously and had a "sleep study." He reports Dr. Juanetta Gosling ordered a watch from [facility] that monitored him overnight. Afterwards, he reports he was ordered tubing that goes into his nostrils and provides oxygen.   COPD: Reports he started advair and is having a better time breathing.   Anxiety and Depression: Reports Dr. Stevphen Rochester Su has increased wellbutrin to 300 mg BID.  ------------------------------------------------------------------------------------    Allergies  Allergen Reactions  . Cephalexin Rash    severe     Current Outpatient Medications:  .  acetaminophen (TYLENOL) 325 MG tablet, Take 325 mg by mouth daily. , Disp: , Rfl:  .  albuterol (PROVENTIL HFA;VENTOLIN HFA) 108 (90 Base) MCG/ACT inhaler, Inhale 2 puffs every 4 (four) hours as needed into the lungs for wheezing  or shortness of breath., Disp: 1 Inhaler, Rfl: 3 .  amLODipine (NORVASC) 10 MG tablet, TAKE ONE TABLET BY MOUTH EVERY DAY (Patient taking differently: Take 10 mg by mouth daily. ), Disp: 30 tablet, Rfl: 0 .  buPROPion (WELLBUTRIN XL) 300 MG 24 hr tablet, Take 300 mg by mouth 2 (two) times daily. , Disp: , Rfl:  .  Fluticasone-Salmeterol (ADVAIR DISKUS) 250-50 MCG/DOSE AEPB, Inhale 1 puff into the lungs 2 (two) times daily., Disp: 1 each, Rfl: 3 .  hydrochlorothiazide (HYDRODIURIL) 25 MG tablet, Take 1 tablet (25 mg total) by mouth daily., Disp: 90 tablet, Rfl: 3 .  ibuprofen (ADVIL,MOTRIN) 600 MG tablet, Take 1 tablet (600 mg total) by mouth every 8 (eight) hours as needed for mild pain or moderate pain., Disp: 30 tablet, Rfl: 0 .  lisinopril (PRINIVIL,ZESTRIL) 40 MG tablet, Take 1 tablet (40 mg total) by mouth daily., Disp: 90 tablet, Rfl: 3 .  Multiple Vitamin (MULTIVITAMIN WITH MINERALS) TABS tablet, Take 1 tablet by mouth daily., Disp: , Rfl:  .  nicotine (NICODERM CQ - DOSED IN MG/24 HOURS) 21 mg/24hr patch, Place 21 mg onto the skin daily. , Disp: , Rfl: 3 .  OXYGEN, Inhale 3 L into the lungs at bedtime. , Disp: , Rfl:   Review of Systems  Constitutional: Negative.   Cardiovascular: Negative.     Social History   Tobacco Use  . Smoking status: Former Smoker    Packs/day:  0.25    Types: Cigarettes  . Smokeless tobacco: Never Used  Substance Use Topics  . Alcohol use: Not Currently    Alcohol/week: 0.0 standard drinks      Objective:   BP (!) 185/105 (BP Location: Left Arm, Patient Position: Sitting, Cuff Size: Large)   Pulse 80   Temp 97.7 F (36.5 C) (Oral)   Resp 16   Wt 271 lb (122.9 kg)   SpO2 97%   BMI 34.79 kg/m  Vitals:   11/23/18 0949  BP: (!) 185/105  Pulse: 80  Resp: 16  Temp: 97.7 F (36.5 C)  TempSrc: Oral  SpO2: 97%  Weight: 271 lb (122.9 kg)     Physical Exam Constitutional:      Appearance: Normal appearance.  Cardiovascular:     Rate and  Rhythm: Normal rate and regular rhythm.     Heart sounds: Normal heart sounds.  Pulmonary:     Effort: Pulmonary effort is normal.     Breath sounds: Normal breath sounds.  Skin:    General: Skin is warm and dry.  Neurological:     Mental Status: He is alert and oriented to person, place, and time. Mental status is at baseline.  Psychiatric:        Mood and Affect: Mood normal.        Behavior: Behavior normal.     Results of the Epworth flowsheet 11/23/2018  Sitting and reading 2  Watching TV 1  Sitting, inactive in a public place (e.g. a theatre or a meeting) 1  As a passenger in a car for an hour without a break 1  Lying down to rest in the afternoon when circumstances permit 2  Sitting and talking to someone 0  Sitting quietly after a lunch without alcohol 2  In a car, while stopped for a few minutes in traffic 0  Total score 9        Assessment & Plan    1. Resistant hypertension  Blood pressure remains uncontrolled. I have looked through patient's old records with Dr. Juanetta GoslingHawkins and I see orders for home oxygen but I do not see that a sleep study has ever been performed. I do wonder if he has obstructive sleep apnea that has been untreated and is driving up his blood pressure. Will change hctz to medication as below, his electrolytes are normal and will have to monitor his BP and potassium very closely. He is already on maximum norvasc and Lisinopril. He had sick sinus syndrome and so hesitant to start a beta blocker. He reports he has never had workup for secondary hypertension.  - Home sleep test - triamterene-hydrochlorothiazide (MAXZIDE-25) 37.5-25 MG tablet; Take 1 tablet by mouth daily.  Dispense: 90 tablet; Refill: 3  2. Suspected sleep apnea  Epworth 9 today, will pursue home sleep study.   - Home sleep test  3. Sick sinus syndrome [facility](HCC)  Has had dual chamber pacemaker placed by Dr. Darrold JunkerParaschos but he is followed by Dr. Welton FlakesKhan.   4. Simple chronic bronchitis  (HCC)  Continue Advair.  No follow-ups on file.  The entirety of the information documented in the History of Present Illness, Review of Systems and Physical Exam were personally obtained by me. Portions of this information were initially documented by Rondel BatonSulibeya Dimas, CMA and reviewed by me for thoroughness and accuracy.        Trey SailorsAdriana M Kiki Bivens, PA-C  [facility]

## 2018-11-26 ENCOUNTER — Telehealth: Payer: Self-pay | Admitting: Physician Assistant

## 2018-11-26 NOTE — Telephone Encounter (Signed)
Order for home sleep study faxed to [facility] °

## 2018-12-09 DIAGNOSIS — E785 Hyperlipidemia, unspecified: Secondary | ICD-10-CM | POA: Diagnosis not present

## 2018-12-09 DIAGNOSIS — R079 Chest pain, unspecified: Secondary | ICD-10-CM | POA: Diagnosis not present

## 2018-12-09 DIAGNOSIS — R0602 Shortness of breath: Secondary | ICD-10-CM | POA: Diagnosis not present

## 2018-12-09 DIAGNOSIS — I10 Essential (primary) hypertension: Secondary | ICD-10-CM | POA: Diagnosis not present

## 2018-12-19 DIAGNOSIS — I720 Aneurysm of carotid artery: Secondary | ICD-10-CM | POA: Diagnosis not present

## 2018-12-19 DIAGNOSIS — J449 Chronic obstructive pulmonary disease, unspecified: Secondary | ICD-10-CM | POA: Diagnosis not present

## 2018-12-23 ENCOUNTER — Other Ambulatory Visit: Payer: Self-pay | Admitting: Physician Assistant

## 2018-12-23 ENCOUNTER — Encounter: Payer: Self-pay | Admitting: Physician Assistant

## 2018-12-23 ENCOUNTER — Ambulatory Visit (INDEPENDENT_AMBULATORY_CARE_PROVIDER_SITE_OTHER): Payer: Medicare Other | Admitting: Physician Assistant

## 2018-12-23 VITALS — BP 171/97 | HR 85 | Temp 98.1°F | Wt 272.6 lb

## 2018-12-23 DIAGNOSIS — J410 Simple chronic bronchitis: Secondary | ICD-10-CM | POA: Diagnosis not present

## 2018-12-23 DIAGNOSIS — I10 Essential (primary) hypertension: Secondary | ICD-10-CM | POA: Diagnosis not present

## 2018-12-23 DIAGNOSIS — R739 Hyperglycemia, unspecified: Secondary | ICD-10-CM | POA: Diagnosis not present

## 2018-12-23 DIAGNOSIS — I1A0 Resistant hypertension: Secondary | ICD-10-CM

## 2018-12-23 MED ORDER — HYDROCHLOROTHIAZIDE 25 MG PO TABS: 25.0000 mg | ORAL_TABLET | Freq: Every day | ORAL | 3 refills | Status: DC

## 2018-12-23 MED ORDER — FLUTICASONE-SALMETEROL 250-50 MCG/DOSE IN AEPB: 1.0000 | INHALATION_SPRAY | Freq: Two times a day (BID) | RESPIRATORY_TRACT | 3 refills | Status: AC

## 2018-12-23 MED ORDER — TRIAMTERENE-HCTZ 37.5-25 MG PO TABS: 1.0000 | ORAL_TABLET | Freq: Every day | ORAL | 3 refills | Status: DC

## 2018-12-23 MED ORDER — AMLODIPINE BESYLATE 10 MG PO TABS: 10.0000 mg | ORAL_TABLET | Freq: Every day | ORAL | 0 refills | Status: DC

## 2018-12-23 MED ORDER — SPIRONOLACTONE 50 MG PO TABS: 50.0000 mg | ORAL_TABLET | Freq: Every day | ORAL | 0 refills | Status: DC

## 2018-12-23 NOTE — Progress Notes (Signed)
Patient: Eric Lawrence Male    DOB: Jan 12, 1966   53 y.o.   MRN: [id number] Visit Date: 12/23/2018  Today's Provider: Trey Sailors, PA-C   Chief Complaint  Patient presents with  . Hypertension   Subjective:    HPI  Hypertension, follow-up:  BP Readings from Last 3 Encounters:  12/23/18 (!) 171/97  11/23/18 (!) 185/105  11/09/18 (!) 170/107    He was last seen for hypertension 1 months ago.  BP at that visit was 185/105. Management changes since that visit include home sleep study and triamterene-hydrochlorothiazide (MAXZIDE-25) 37.5-25 MG tablet . He reports good compliance with treatment. However, it turns out he has not been taking 10 mg amlodipine for four months now. He says somebody told him to stop it.  He is not having side effects.  He is exercising. He is adherent to low salt diet.   Outside blood pressures are 172/89. He is experiencing none.  Patient denies chest pain, chest pressure/discomfort, exertional chest pressure/discomfort, fatigue, irregular heart beat, lower extremity edema, palpitations and tachypnea.   Cardiovascular risk factors include hypertension.  Use of agents associated with hypertension: none.     Weight trend: stable Wt Readings from Last 3 Encounters:  12/23/18 272 lb 9.6 oz (123.7 kg)  11/23/18 271 lb (122.9 kg)  11/09/18 271 lb (122.9 kg)    Current diet: well balanced patient states he is eating smaller portions and do not drink any coffee, sweet tea and soda (only sierra mist). Patient states he will eat cereal for breakfast, peanut butter and jelly sandwich for lunch and a lite dinner.  ------------------------------------------------------------------------   Allergies  Allergen Reactions  . Cephalexin Rash    severe     Current Outpatient Medications:  .  acetaminophen (TYLENOL) 325 MG tablet, Take 325 mg by mouth daily. , Disp: , Rfl:  .  albuterol (PROVENTIL HFA;VENTOLIN HFA) 108 (90 Base) MCG/ACT  inhaler, Inhale 2 puffs every 4 (four) hours as needed into the lungs for wheezing or shortness of breath., Disp: 1 Inhaler, Rfl: 3 .  amLODipine (NORVASC) 10 MG tablet, Take 1 tablet (10 mg total) by mouth daily., Disp: 90 tablet, Rfl: 0 .  buPROPion (WELLBUTRIN XL) 300 MG 24 hr tablet, Take 300 mg by mouth 2 (two) times daily. , Disp: , Rfl:  .  Fluticasone-Salmeterol (ADVAIR DISKUS) 250-50 MCG/DOSE AEPB, Inhale 1 puff into the lungs 2 (two) times daily., Disp: 1 each, Rfl: 3 .  ibuprofen (ADVIL,MOTRIN) 600 MG tablet, Take 1 tablet (600 mg total) by mouth every 8 (eight) hours as needed for mild pain or moderate pain., Disp: 30 tablet, Rfl: 0 .  lisinopril (PRINIVIL,ZESTRIL) 40 MG tablet, Take 1 tablet (40 mg total) by mouth daily., Disp: 90 tablet, Rfl: 3 .  Multiple Vitamin (MULTIVITAMIN WITH MINERALS) TABS tablet, Take 1 tablet by mouth daily., Disp: , Rfl:  .  nicotine (NICODERM CQ - DOSED IN MG/24 HOURS) 21 mg/24hr patch, Place 21 mg onto the skin daily. , Disp: , Rfl: 3 .  OXYGEN, Inhale 3 L into the lungs at bedtime. , Disp: , Rfl:  .  triamterene-hydrochlorothiazide (MAXZIDE-25) 37.5-25 MG tablet, Take 1 tablet by mouth daily., Disp: 90 tablet, Rfl: 3  Review of Systems  HENT: Negative.   Respiratory: Negative.   Genitourinary: Negative.   Neurological: Negative.     Social History   Tobacco Use  . Smoking status: Former Smoker    Packs/day: 0.25  Types: Cigarettes  . Smokeless tobacco: Never Used  Substance Use Topics  . Alcohol use: Not Currently    Alcohol/week: 0.0 standard drinks      Objective:   BP (!) 171/97 (BP Location: Left Arm, Patient Position: Sitting, Cuff Size: Large)   Pulse 85   Temp 98.1 F (36.7 C) (Oral)   Wt 272 lb 9.6 oz (123.7 kg)   SpO2 96%   BMI 35.00 kg/m  Vitals:   12/23/18 1012  BP: (!) 171/97  Pulse: 85  Temp: 98.1 F (36.7 C)  TempSrc: Oral  SpO2: 96%  Weight: 272 lb 9.6 oz (123.7 kg)     Physical Exam Constitutional:       Appearance: Normal appearance.  Cardiovascular:     Rate and Rhythm: Normal rate and regular rhythm.     Pulses: Normal pulses.     Heart sounds: Normal heart sounds.  Pulmonary:     Breath sounds: Normal breath sounds.  Skin:    General: Skin is warm and dry.  Neurological:     Mental Status: He is alert and oriented to person, place, and time. Mental status is at baseline.  Psychiatric:        Mood and Affect: Mood normal.        Behavior: Behavior normal.         Assessment & Plan    1. Essential hypertension  Apparently he has not been taking his 10 mg amlodipine for 3 months. This would really explain a lot as he says he has been taking it on medication review. I will still proceed with secondary HTN workup as his blood pressure has typically not been controlled even on all agents.   - Comprehensive Metabolic Panel (CMET) - US RENAL ARTERY DUPLEX COMPLETE; Future - amLODipine (NORVASC) 10 MG tablet; Take 1 tablet (10 mg total) by mouth daily.  Dispense: 90 tablet; Refill: 0  2. Resistant hypertension  - Metanephrines, Urine, 24 hour - triamterene-hydrochlorothiazide (MAXZIDE-25) 37.5-25 MG tablet; Take 1 tablet by mouth daily.  Dispense: 90 tablet; Refill: 3  3. Hyperglycemia  - HgB A1c  4. Simple chronic bronchitis (HCC)  - Fluticasone-Salmeterol (ADVAIR DISKUS) 250-50 MCG/DOSE AEPB; Inhale 1 puff into the lungs 2 (two) times daily.  Dispense: 1 each; Refill: 3  The entirety of the information documented in the History of Present Illness, Review of Systems and Physical Exam were personally obtained by me. Portions of this information were initially documented by Rondel Baton, CMA and reviewed by me for thoroughness and accuracy.   Return in about 2 weeks (around 01/06/2019) for HTN.     Trey Sailors, PA-C  [facility]

## 2018-12-23 NOTE — Patient Instructions (Signed)

## 2018-12-24 ENCOUNTER — Telehealth: Payer: Self-pay

## 2018-12-24 ENCOUNTER — Ambulatory Visit: Payer: Medicare Other | Admitting: Physician Assistant

## 2018-12-24 LAB — COMPREHENSIVE METABOLIC PANEL
ALT: 43 IU/L (ref 0–44)
AST: 24 IU/L (ref 0–40)
Albumin/Globulin Ratio: 2 (ref 1.2–2.2)
Albumin: 4.5 g/dL (ref 3.8–4.9)
Alkaline Phosphatase: 98 IU/L (ref 39–117)
BUN/Creatinine Ratio: 11 (ref 9–20)
BUN: 13 mg/dL (ref 6–24)
Bilirubin Total: 0.4 mg/dL (ref 0.0–1.2)
CO2: 25 mmol/L (ref 20–29)
Calcium: 9.8 mg/dL (ref 8.7–10.2)
Chloride: 102 mmol/L (ref 96–106)
Creatinine, Ser: 1.2 mg/dL (ref 0.76–1.27)
GFR calc Af Amer: 78 mL/min/{1.73_m2} (ref >59)
GFR calc non Af Amer: 68 mL/min/{1.73_m2} (ref >59)
Globulin, Total: 2.2 g/dL (ref 1.5–4.5)
Glucose: 112 mg/dL — ABNORMAL HIGH (ref 65–99)
Potassium: 4.3 mmol/L (ref 3.5–5.2)
Sodium: 142 mmol/L (ref 134–144)
Total Protein: 6.7 g/dL (ref 6.0–8.5)

## 2018-12-24 LAB — HEMOGLOBIN A1C
Est. average glucose Bld gHb Est-mCnc: 146 mg/dL
Hgb A1c MFr Bld: 6.7 % — ABNORMAL HIGH (ref 4.8–5.6)

## 2018-12-24 NOTE — Telephone Encounter (Signed)
-----   Message from Trey Sailors, [place] sent at 12/24/2018  2:54 PM EST ----- Metabolic panel looks OK. A1c shows diabetes. We will talk about this at follow up. Can we change follow up visit to 40 min?

## 2018-12-24 NOTE — Telephone Encounter (Signed)
Patient advised as below. Patient verbalizes understanding and is in agreement with treatment plan.  

## 2018-12-26 DIAGNOSIS — I10 Essential (primary) hypertension: Secondary | ICD-10-CM | POA: Diagnosis not present

## 2018-12-30 ENCOUNTER — Ambulatory Visit: Payer: Medicare Other

## 2018-12-30 ENCOUNTER — Telehealth: Payer: Self-pay | Admitting: Physician Assistant

## 2018-12-30 DIAGNOSIS — I472 Ventricular tachycardia: Secondary | ICD-10-CM | POA: Diagnosis not present

## 2018-12-30 DIAGNOSIS — R0602 Shortness of breath: Secondary | ICD-10-CM | POA: Diagnosis not present

## 2018-12-30 DIAGNOSIS — Z950 Presence of cardiac pacemaker: Secondary | ICD-10-CM | POA: Diagnosis not present

## 2018-12-30 NOTE — Telephone Encounter (Signed)
lmtcb

## 2018-12-30 NOTE — Telephone Encounter (Signed)
Pt stated that he was advised he missed an [place] appt that was scheduled for today that he didn't know about the appt. Pt is requesting that it be rescheduled. Please advise. Thanks TNP

## 2018-12-30 NOTE — Telephone Encounter (Signed)
Pt did not show up for his renal artery [place] this morning.

## 2018-12-31 LAB — METANEPHRINES, URINE, 24 HOUR
Metaneph Total, Ur: 55 ug/L
Metanephrines, 24H Ur: 80 ug/24 hr (ref 45–290)
Normetanephrine, 24H Ur: 252 ug/24 hr (ref 82–500)
Normetanephrine, Ur: 174 ug/L

## 2018-12-31 NOTE — Telephone Encounter (Signed)
Eric Lawrence, could we possibly reschedule? I do not find patient to be reliable enough to schedule himself.

## 2018-12-31 NOTE — Telephone Encounter (Signed)
Patient advised as below.  

## 2018-12-31 NOTE — Telephone Encounter (Signed)
-----   Message from Trey Sailors, [place] sent at 12/31/2018  3:26 PM EST ----- Urine test is normal.

## 2019-01-05 ENCOUNTER — Telehealth: Payer: Self-pay

## 2019-01-05 ENCOUNTER — Ambulatory Visit
Admission: RE | Admit: 2019-01-05 | Discharge: 2019-01-05 | Disposition: A | Discharge status: Home / Self Care | Payer: Medicare Other | Source: Ambulatory Visit | Attending: Physician Assistant | Admitting: Physician Assistant

## 2019-01-05 DIAGNOSIS — I10 Essential (primary) hypertension: Secondary | ICD-10-CM | POA: Diagnosis not present

## 2019-01-05 DIAGNOSIS — N281 Cyst of kidney, acquired: Secondary | ICD-10-CM | POA: Insufficient documentation

## 2019-01-05 NOTE — Telephone Encounter (Signed)
-----   Message from Trey Sailors, [place] sent at 01/05/2019  4:00 PM EST ----- Some cysts on kidneys but otherwise does not show narrowing of his arteries in his kidneys. His blood pressure is likely high because he is not taking his medication right and he needs to be taking amlodipine in ADDITION to all of his other medication.

## 2019-01-05 NOTE — Telephone Encounter (Signed)
LMTCB  Thanks, -Keven Soucy R.

## 2019-01-06 ENCOUNTER — Ambulatory Visit: Payer: Self-pay | Admitting: Physician Assistant

## 2019-01-07 NOTE — Telephone Encounter (Signed)
LVMTRC 

## 2019-01-12 NOTE — Telephone Encounter (Signed)
Patient has been advised he wants to know if there is any further workup or concerns about cyst found on kidneys? KW

## 2019-01-17 DIAGNOSIS — J449 Chronic obstructive pulmonary disease, unspecified: Secondary | ICD-10-CM | POA: Diagnosis not present

## 2019-01-17 DIAGNOSIS — I720 Aneurysm of carotid artery: Secondary | ICD-10-CM | POA: Diagnosis not present

## 2019-01-19 NOTE — Telephone Encounter (Signed)
LMTCB

## 2019-01-19 NOTE — Telephone Encounter (Signed)
No they should not be harmful.

## 2019-01-20 NOTE — Telephone Encounter (Signed)
Patient advised as directed. Patient is asking if the cyst is inside his kidney or outside? And if needs to be monitor? Per Adriana no need to monitor and cyst is inside.Cyst is benign.

## 2019-02-17 DIAGNOSIS — J449 Chronic obstructive pulmonary disease, unspecified: Secondary | ICD-10-CM | POA: Diagnosis not present

## 2019-02-17 DIAGNOSIS — I720 Aneurysm of carotid artery: Secondary | ICD-10-CM | POA: Diagnosis not present

## 2019-03-19 DIAGNOSIS — J449 Chronic obstructive pulmonary disease, unspecified: Secondary | ICD-10-CM | POA: Diagnosis not present

## 2019-03-19 DIAGNOSIS — I720 Aneurysm of carotid artery: Secondary | ICD-10-CM | POA: Diagnosis not present

## 2019-03-19 IMAGING — US US ABDOMEN LIMITED
1 series · 14 of 25 positions shown · non-contrast
Comparison: None.

CLINICAL DATA: Epigastric pain for 7 hours

EXAM:
ULTRASOUND ABDOMEN LIMITED RIGHT UPPER QUADRANT

[Series 1: us abdomen limited · 0.36mm/px · 14 of 43 slices shown]
[im 1/43]
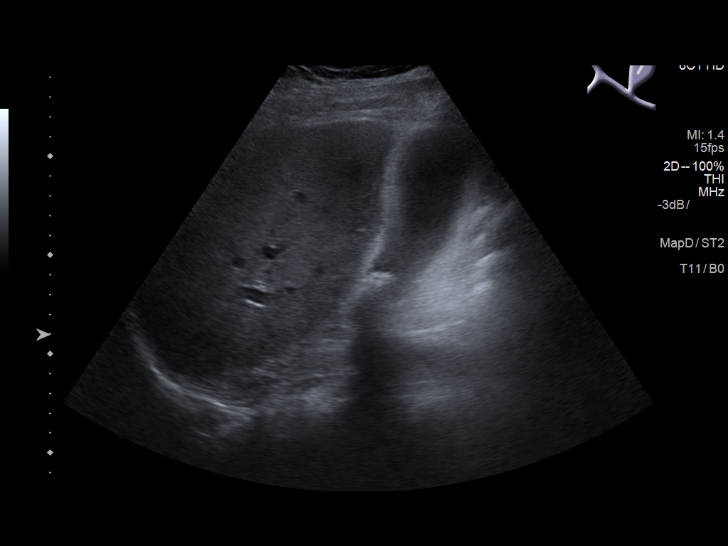
[im 4/43]
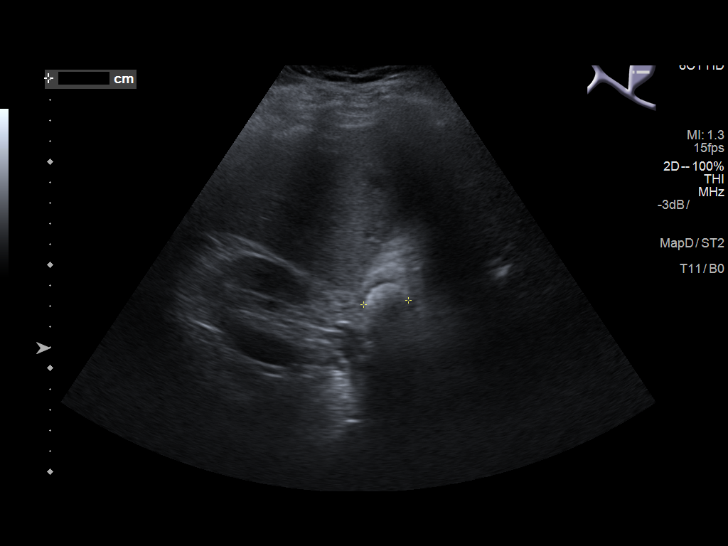
[im 8/43]
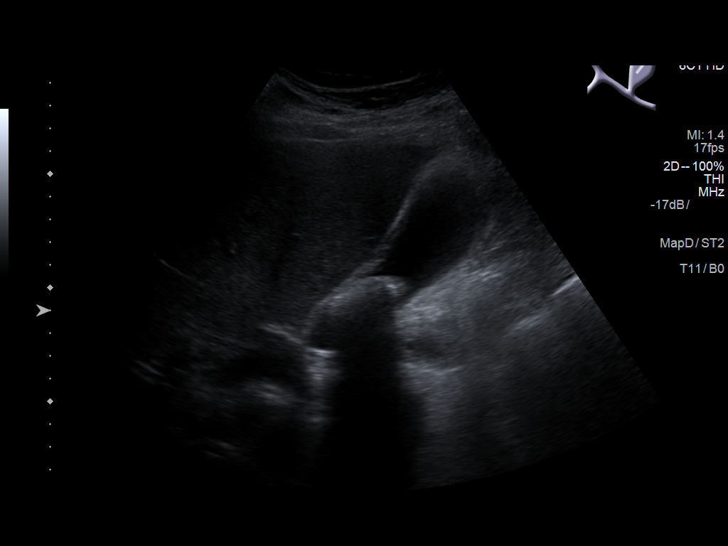
[im 11/43]
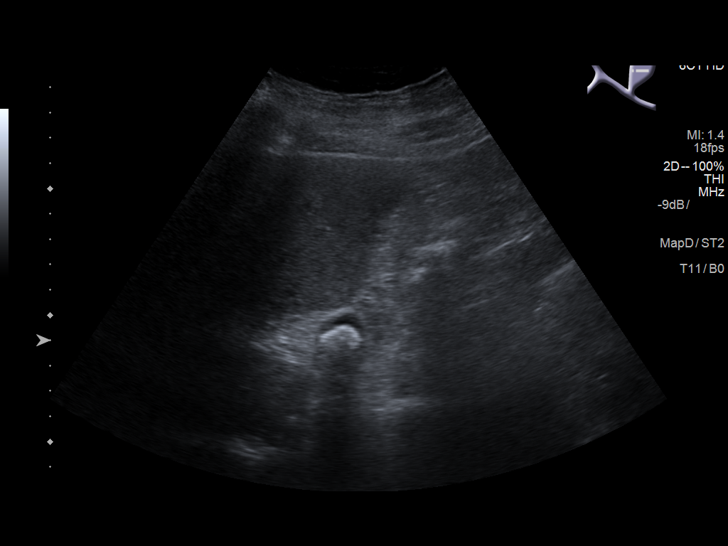
[im 15/43]
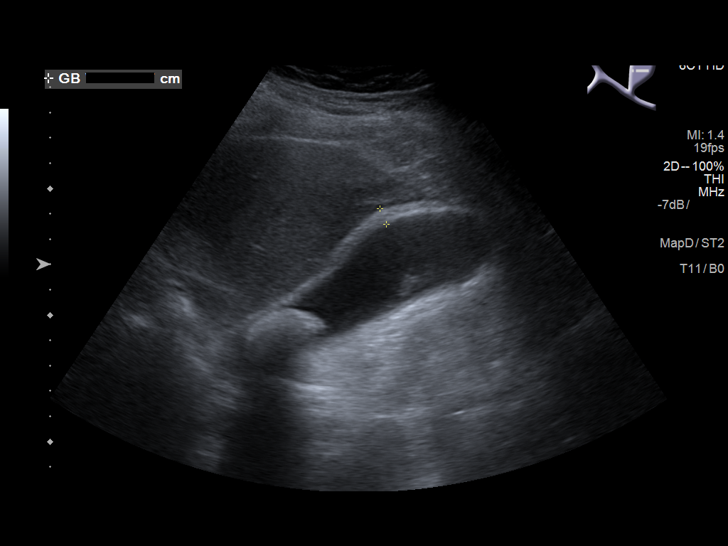
[im 16/43]
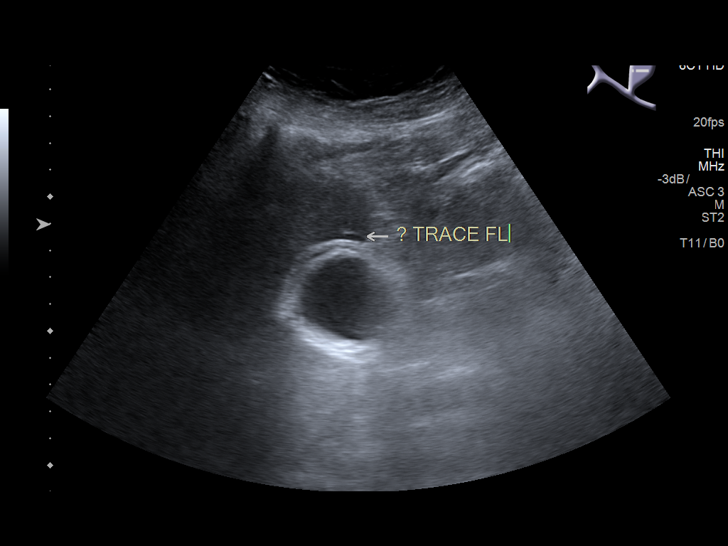
[im 20/43]
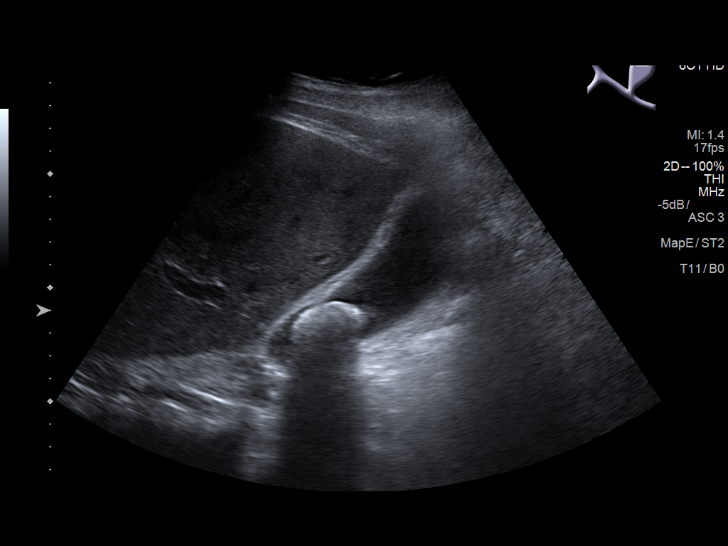
[im 23/43]
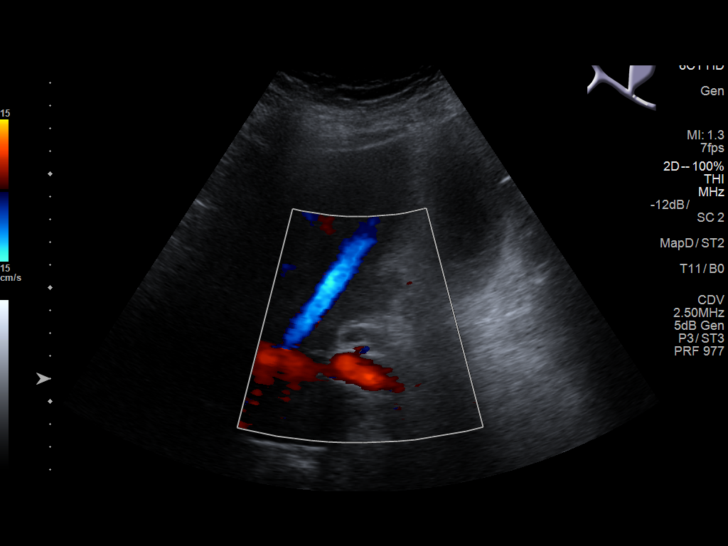
[im 27/43]
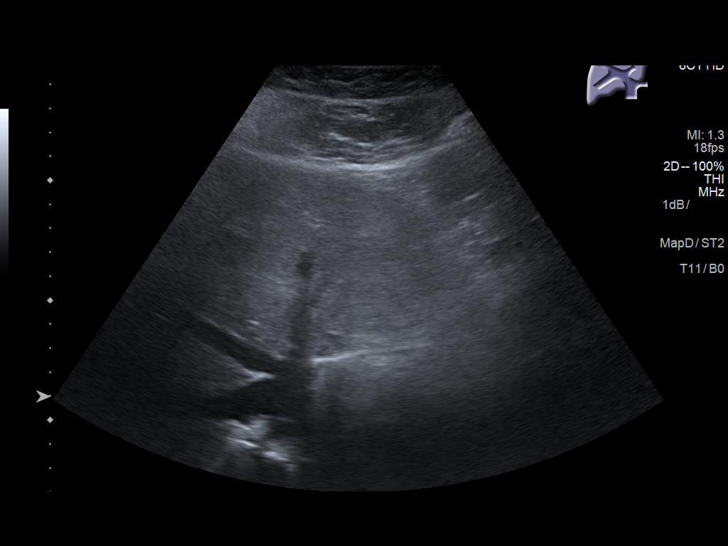
[im 29/43]
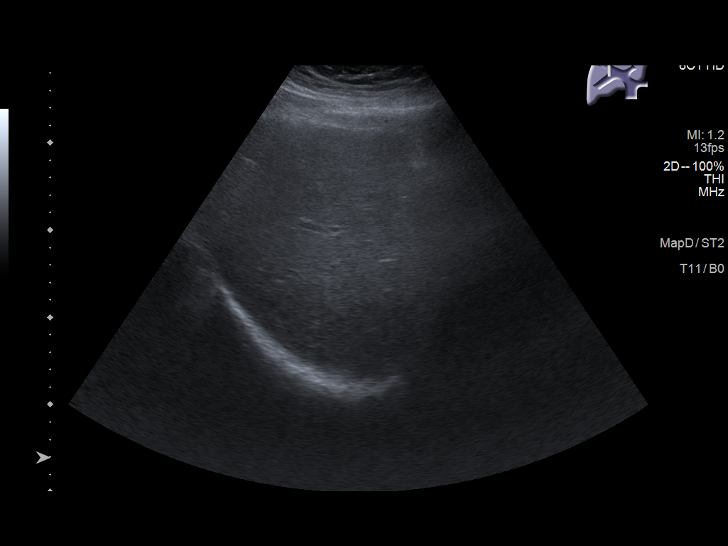
[im 32/43]
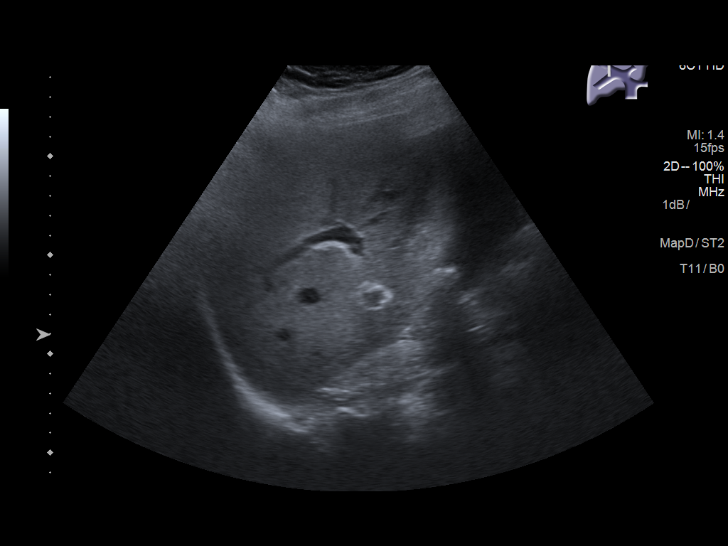
[im 36/43]
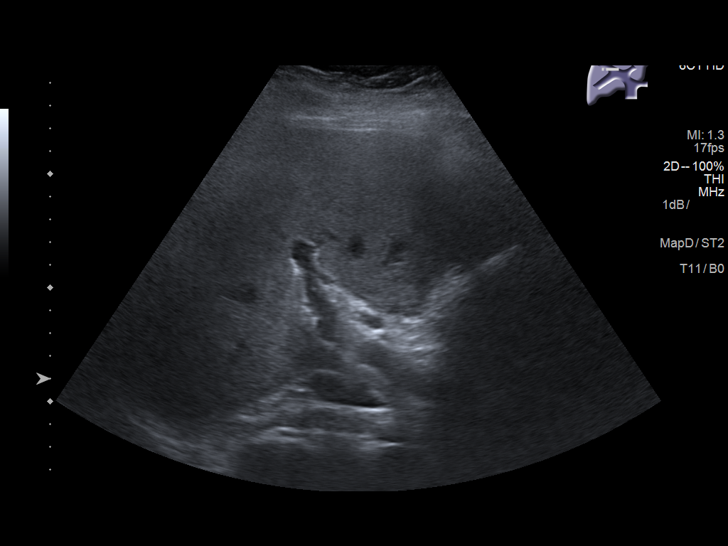
[im 39/43]
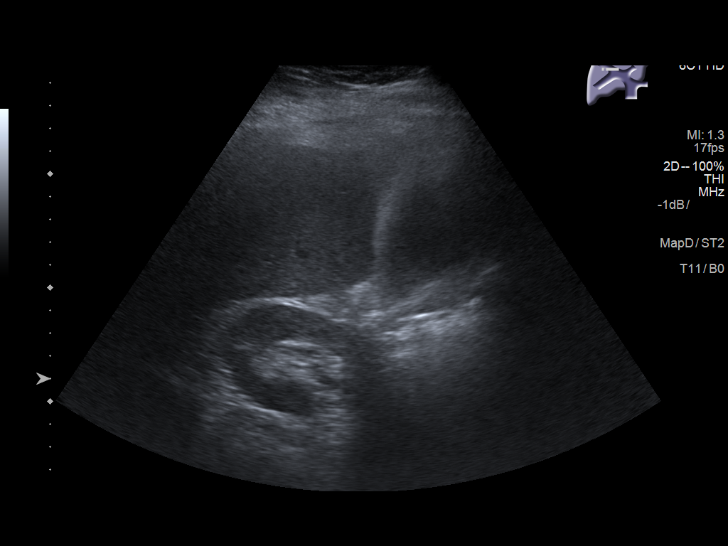
[im 43/43]
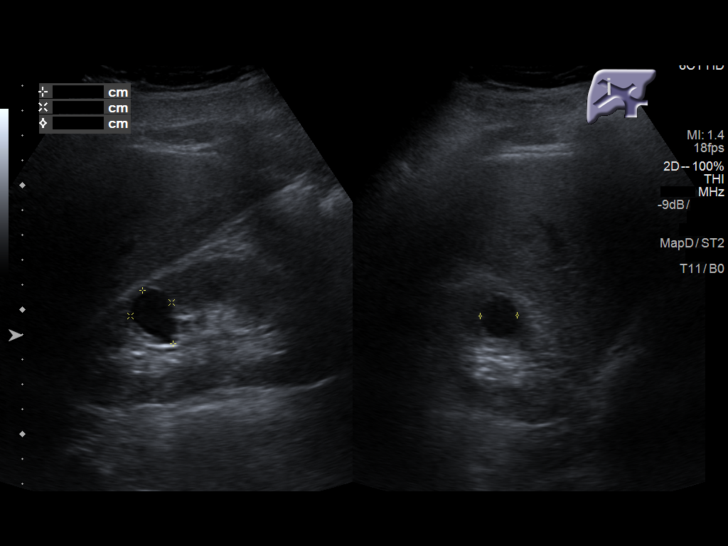

[14 of 25 positions shown; findings below may reference images not displayed]

FINDINGS: Gallbladder:

Large calcified stone in the gallbladder measuring up to 3.2 cm with
associated sludge. Stone appears non mobile at the gallbladder neck.
Wall thickening up to 6.7 mm with possible small amount of
pericholecystic fluid. Negative sonographic Murphy.

Common bile duct:

Diameter: Upper normal at 5.9 mm.

Liver:

No focal lesion identified. Within normal limits in parenchymal
echogenicity. Portal vein is patent on color Doppler imaging with
normal direction of blood flow towards the liver.

Incidental note made of a cyst in the upper pole of the right kidney
measuring 2.5 x 1.7 x 1.5 cm.
IMPRESSION: 1. Large non mobile stone at the gallbladder neck with associated
sludge. Wall thickening and possible trace amount of pericholecystic
fluid, findings raise suspicion for acute cholecystitis despite
negative sonographic Murphy. No biliary dilatation
2. Incidental note made of a cyst in the upper pole of the right
kidney

## 2019-03-29 DIAGNOSIS — I10 Essential (primary) hypertension: Secondary | ICD-10-CM | POA: Diagnosis not present

## 2019-03-29 DIAGNOSIS — Z950 Presence of cardiac pacemaker: Secondary | ICD-10-CM | POA: Diagnosis not present

## 2019-03-29 DIAGNOSIS — I420 Dilated cardiomyopathy: Secondary | ICD-10-CM | POA: Diagnosis not present

## 2019-03-29 DIAGNOSIS — I2510 Atherosclerotic heart disease of native coronary artery without angina pectoris: Secondary | ICD-10-CM | POA: Diagnosis not present

## 2019-03-29 DIAGNOSIS — I472 Ventricular tachycardia: Secondary | ICD-10-CM | POA: Diagnosis not present

## 2019-04-05 DIAGNOSIS — R0602 Shortness of breath: Secondary | ICD-10-CM | POA: Diagnosis not present

## 2019-04-13 DIAGNOSIS — R9341 Abnormal radiologic findings on diagnostic imaging of renal pelvis, ureter, or bladder: Secondary | ICD-10-CM | POA: Diagnosis not present

## 2019-04-19 DIAGNOSIS — J449 Chronic obstructive pulmonary disease, unspecified: Secondary | ICD-10-CM | POA: Diagnosis not present

## 2019-04-19 DIAGNOSIS — I720 Aneurysm of carotid artery: Secondary | ICD-10-CM | POA: Diagnosis not present

## 2019-04-20 ENCOUNTER — Ambulatory Visit: Payer: Medicare Other | Admitting: Physician Assistant

## 2019-04-20 NOTE — Progress Notes (Deleted)
       Patient: Eric Lawrence Male    DOB: 1966-04-04   53 y.o.   MRN: [id number] Visit Date: 04/20/2019  Today's Provider: Trinna Post, PA-C   No chief complaint on file.  Subjective:     HPI  Hypertension, follow-up:  BP Readings from Last 3 Encounters:  12/23/18 (!) 171/97  11/23/18 (!) 185/105  11/09/18 (!) 170/107    He was last seen for hypertension {NUMBERS 1-12:18279} {days/wks/mos/yrs:310907} ago.  BP at that visit was ***. Management changes since that visit include ***. He reports {excellent/good/fair/poor:19665} compliance with treatment. He {ACTION; IS/IS [id number]} having side effects. *** He {is/is not:9024} exercising. He {is/is not:9024} adherent to low salt diet.   Outside blood pressures are ***. He is experiencing {Symptoms; cardiac:12860}.  Patient denies {Symptoms; cardiac:12860}.   Cardiovascular risk factors include {cv risk factors:510}.  Use of agents associated with hypertension: {bp agents assoc with hypertension:511::"none"}.     Weight trend: {trend:16658} Wt Readings from Last 3 Encounters:  12/23/18 272 lb 9.6 oz (123.7 kg)  11/23/18 271 lb (122.9 kg)  11/09/18 271 lb (122.9 kg)    Current diet: {diet habits:16563}  ------------------------------------------------------------------------   Allergies  Allergen Reactions  . Cephalexin Rash    severe     Current Outpatient Medications:  .  acetaminophen (TYLENOL) 325 MG tablet, Take 325 mg by mouth daily. , Disp: , Rfl:  .  albuterol (PROVENTIL HFA;VENTOLIN HFA) 108 (90 Base) MCG/ACT inhaler, Inhale 2 puffs every 4 (four) hours as needed into the lungs for wheezing or shortness of breath., Disp: 1 Inhaler, Rfl: 3 .  amLODipine (NORVASC) 10 MG tablet, Take 1 tablet (10 mg total) by mouth daily., Disp: 90 tablet, Rfl: 0 .  buPROPion (WELLBUTRIN XL) 300 MG 24 hr tablet, Take 300 mg by mouth 2 (two) times daily. , Disp: , Rfl:  .  Fluticasone-Salmeterol (ADVAIR DISKUS)  250-50 MCG/DOSE AEPB, Inhale 1 puff into the lungs 2 (two) times daily., Disp: 1 each, Rfl: 3 .  ibuprofen (ADVIL,MOTRIN) 600 MG tablet, Take 1 tablet (600 mg total) by mouth every 8 (eight) hours as needed for mild pain or moderate pain., Disp: 30 tablet, Rfl: 0 .  lisinopril (PRINIVIL,ZESTRIL) 40 MG tablet, Take 1 tablet (40 mg total) by mouth daily., Disp: 90 tablet, Rfl: 3 .  Multiple Vitamin (MULTIVITAMIN WITH MINERALS) TABS tablet, Take 1 tablet by mouth daily., Disp: , Rfl:  .  nicotine (NICODERM CQ - DOSED IN MG/24 HOURS) 21 mg/24hr patch, Place 21 mg onto the skin daily. , Disp: , Rfl: 3 .  OXYGEN, Inhale 3 L into the lungs at bedtime. , Disp: , Rfl:  .  triamterene-hydrochlorothiazide (MAXZIDE-25) 37.5-25 MG tablet, Take 1 tablet by mouth daily., Disp: 90 tablet, Rfl: 3  Review of Systems  Constitutional: Negative.   Respiratory: Negative.   Cardiovascular: Negative.     Social History   Tobacco Use  . Smoking status: Former Smoker    Packs/day: 0.25    Types: Cigarettes  . Smokeless tobacco: Never Used  Substance Use Topics  . Alcohol use: Not Currently    Alcohol/week: 0.0 standard drinks      Objective:   There were no vitals taken for this visit. There were no vitals filed for this visit.   Physical Exam      Assessment & Plan        Trinna Post, PA-C  [facility]

## 2019-04-21 ENCOUNTER — Ambulatory Visit (INDEPENDENT_AMBULATORY_CARE_PROVIDER_SITE_OTHER): Payer: Medicare Other | Admitting: Physician Assistant

## 2019-04-21 ENCOUNTER — Encounter: Payer: Self-pay | Admitting: Physician Assistant

## 2019-04-21 ENCOUNTER — Other Ambulatory Visit: Payer: Self-pay

## 2019-04-21 VITALS — BP 197/108 | HR 80 | Temp 98.7°F | Resp 16 | Wt 267.8 lb

## 2019-04-21 DIAGNOSIS — R739 Hyperglycemia, unspecified: Secondary | ICD-10-CM | POA: Diagnosis not present

## 2019-04-21 DIAGNOSIS — I10 Essential (primary) hypertension: Secondary | ICD-10-CM | POA: Diagnosis not present

## 2019-04-21 DIAGNOSIS — E119 Type 2 diabetes mellitus without complications: Secondary | ICD-10-CM

## 2019-04-21 LAB — POCT GLYCOSYLATED HEMOGLOBIN (HGB A1C)
Est. average glucose Bld gHb Est-mCnc: 134
Hemoglobin A1C: 6.3 % — AB (ref 4.0–5.6)

## 2019-04-21 NOTE — Progress Notes (Signed)
Patient: Eric Lawrence Male    DOB: May 22, 1966   53 y.o.   MRN: [id number] Visit Date: 04/21/2019  Today's Provider: Trinna Post, PA-C   Chief Complaint  Patient presents with  . Follow-up   Subjective:     HPI  Hypertension, follow-up:  BP Readings from Last 3 Encounters:  04/21/19 (!) 197/108  12/23/18 (!) 171/97  11/23/18 (!) 185/105    He was last seen for hypertension 4 months ago.  BP at that visit was 171/97. Management changes since that visit include no changes. He is not compliant with his medications. He is not taking them more than 50% of the time. He cites stress about his health situation preventing him from taking this.  He is not having side effects.  He is not exercising. He is adherent to low salt diet.   Outside blood pressures are stable. He is experiencing none Patient denies chest pain.   Cardiovascular risk factors include hypertension and obesity (BMI >= 30 kg/m2).  Use of agents associated with hypertension: none.     Weight trend: decreasing steadily Wt Readings from Last 3 Encounters:  04/21/19 267 lb 12.8 oz (121.5 kg)  12/23/18 272 lb 9.6 oz (123.7 kg)  11/23/18 271 lb (122.9 kg)    Current diet: not asked   Diabetes Mellitus II: Patient was informed he had diabetes in 12/2018 and was instructed to follow up, which he did not. Patient reports that Eric Lawrence cardiologist suggested patient to check glucose levels at home. He instructed him to go to Eric Lawrence and get a meter to check his sugar. He has been checking his sugar randomly.   ------------------------------------------------------------------------   Allergies  Allergen Reactions  . Cephalexin Rash    severe     Current Outpatient Medications:  .  acetaminophen (TYLENOL) 325 MG tablet, Take 325 mg by mouth daily. , Disp: , Rfl:  .  albuterol (PROVENTIL HFA;VENTOLIN HFA) 108 (90 Base) MCG/ACT inhaler, Inhale 2 puffs every 4 (four) hours as needed into the lungs  for wheezing or shortness of breath., Disp: 1 Inhaler, Rfl: 3 .  amLODipine (NORVASC) 10 MG tablet, Take 1 tablet (10 mg total) by mouth daily., Disp: 90 tablet, Rfl: 0 .  buPROPion (WELLBUTRIN XL) 300 MG 24 hr tablet, Take 300 mg by mouth 2 (two) times daily. , Disp: , Rfl:  .  Fluticasone-Salmeterol (ADVAIR DISKUS) 250-50 MCG/DOSE AEPB, Inhale 1 puff into the lungs 2 (two) times daily., Disp: 1 each, Rfl: 3 .  ibuprofen (ADVIL,MOTRIN) 600 MG tablet, Take 1 tablet (600 mg total) by mouth every 8 (eight) hours as needed for mild pain or moderate pain., Disp: 30 tablet, Rfl: 0 .  lisinopril (PRINIVIL,ZESTRIL) 40 MG tablet, Take 1 tablet (40 mg total) by mouth daily., Disp: 90 tablet, Rfl: 3 .  Multiple Vitamin (MULTIVITAMIN WITH MINERALS) TABS tablet, Take 1 tablet by mouth daily., Disp: , Rfl:  .  nicotine (NICODERM CQ - DOSED IN MG/24 HOURS) 21 mg/24hr patch, Place 21 mg onto the skin daily. , Disp: , Rfl: 3 .  OXYGEN, Inhale 3 L into the lungs at bedtime. , Disp: , Rfl:  .  triamterene-hydrochlorothiazide (MAXZIDE-25) 37.5-25 MG tablet, Take 1 tablet by mouth daily., Disp: 90 tablet, Rfl: 3  Review of Systems  Constitutional: Negative.   Eyes: Negative.   Respiratory: Negative.   Cardiovascular: Negative.   Endocrine: Negative.     Social History   Tobacco Use  .  Smoking status: Former Smoker    Packs/day: 0.25    Types: Cigarettes  . Smokeless tobacco: Never Used  Substance Use Topics  . Alcohol use: Not Currently    Alcohol/week: 0.0 standard drinks      Objective:   BP (!) 197/108 (BP Location: Left Arm, Patient Position: Sitting, Cuff Size: Large)   Pulse 80   Temp 98.7 F (37.1 C) (Oral)   Resp 16   Wt 267 lb 12.8 oz (121.5 kg)   SpO2 96%   BMI 34.38 kg/m  Vitals:   04/21/19 1002  BP: (!) 197/108  Pulse: 80  Resp: 16  Temp: 98.7 F (37.1 C)  TempSrc: Oral  SpO2: 96%  Weight: 267 lb 12.8 oz (121.5 kg)     Physical Exam Constitutional:      Appearance:  Normal appearance. He is obese.  Cardiovascular:     Rate and Rhythm: Normal rate and regular rhythm.     Heart sounds: Normal heart sounds.  Pulmonary:     Effort: Pulmonary effort is normal.     Breath sounds: Normal breath sounds.  Skin:    General: Skin is warm and dry.  Neurological:     Mental Status: He is alert and oriented to person, place, and time. Mental status is at baseline.  Psychiatric:        Mood and Affect: Mood normal.        Behavior: Behavior normal.         Assessment & Plan    1. Hyperglycemia  Patient appears to have no recollection of our clinic telling him he has diabetes. Educated on diabetes care today. Informed he needs to lose weight and eat less sugary foods. Referral to CCM. Referral for dilated eye exam.   - POCT glycosylated hemoglobin (Hb A1C)  2. Diabetes mellitus without complication Eric Lawrence(HCC)  - Ambulatory referral to Chronic Care Management Services - Ambulatory referral to Ophthalmology  3. Essential hypertension  He is not taking his medications properly. He reports Eric. Welton Lawrence his cardiologist did an ultrasound of his kidneys. I asked him why because he already had an ultrasound at this clinic 01/2019. He is unsure. Needs to take medications consistently.   I have spent 25 minutes with this patient, >50% of which was spent on counseling   F/u 1 month for DM II       Eric SailorsAdriana M Tyla Burgner, PA-C  [facility]

## 2019-04-22 ENCOUNTER — Telehealth: Payer: Self-pay | Admitting: Physician Assistant

## 2019-04-22 DIAGNOSIS — E119 Type 2 diabetes mellitus without complications: Secondary | ICD-10-CM

## 2019-04-22 MED ORDER — BLOOD GLUCOSE METER KIT: PACK | 0 refills | Status: DC

## 2019-04-22 NOTE — Telephone Encounter (Signed)
Can we please send in a verbal for meter, test strips, and lancets to check fasting sugars once daily.

## 2019-04-22 NOTE — Telephone Encounter (Signed)
Please advise 

## 2019-04-22 NOTE — Telephone Encounter (Signed)
Pt was in yesterday and seen Adriana.  A diabetic testing kit was suppose to be sent to the pharmacy.  He said he checked with the pharmacy and nothing has been sent in yet.   [place]  Thank s  Con [place]

## 2019-04-25 ENCOUNTER — Other Ambulatory Visit: Payer: Self-pay

## 2019-04-25 DIAGNOSIS — E119 Type 2 diabetes mellitus without complications: Secondary | ICD-10-CM

## 2019-04-25 MED ORDER — BLOOD GLUCOSE METER KIT: PACK | 0 refills | Status: AC

## 2019-04-26 DIAGNOSIS — I495 Sick sinus syndrome: Secondary | ICD-10-CM | POA: Diagnosis not present

## 2019-04-29 DIAGNOSIS — I2510 Atherosclerotic heart disease of native coronary artery without angina pectoris: Secondary | ICD-10-CM | POA: Diagnosis not present

## 2019-04-29 DIAGNOSIS — I420 Dilated cardiomyopathy: Secondary | ICD-10-CM | POA: Diagnosis not present

## 2019-04-29 DIAGNOSIS — I10 Essential (primary) hypertension: Secondary | ICD-10-CM | POA: Diagnosis not present

## 2019-04-29 DIAGNOSIS — E782 Mixed hyperlipidemia: Secondary | ICD-10-CM | POA: Diagnosis not present

## 2019-04-29 NOTE — Patient Instructions (Signed)

## 2019-05-02 ENCOUNTER — Ambulatory Visit: Payer: Self-pay | Admitting: *Deleted

## 2019-05-02 ENCOUNTER — Ambulatory Visit: Payer: Medicare Other | Admitting: Physician Assistant

## 2019-05-02 NOTE — Chronic Care Management (AMB) (Signed)
   Chronic Care Management   Unsuccessful Call Note 05/02/2019 Name: Eric Lawrence MRN: [id number] DOB: April 08, 1966  Patient  is a 53 year old male who sees Carles Collet PA-C for primary care. Carles Collet asked the CCM team to consult the patient for diabetes education.  Referral was placed 04/21/19. Patient's last office visit was 04/21/19.     This social worker was unable to reach patient via telephone today for consent for CCM services. I have left HIPAA compliant voicemail asking patient to return my call. (unsuccessful outreach #1).   Plan: Will follow-up within 7 business days via telephone.      Elliot Gurney, [place] Worker  [facility] Care Management [phone]

## 2019-05-03 ENCOUNTER — Ambulatory Visit (INDEPENDENT_AMBULATORY_CARE_PROVIDER_SITE_OTHER): Payer: Medicare Other | Admitting: Physician Assistant

## 2019-05-03 ENCOUNTER — Encounter: Payer: Self-pay | Admitting: Physician Assistant

## 2019-05-03 ENCOUNTER — Encounter: Payer: Self-pay | Admitting: *Deleted

## 2019-05-03 ENCOUNTER — Other Ambulatory Visit: Payer: Self-pay

## 2019-05-03 ENCOUNTER — Ambulatory Visit: Payer: Self-pay | Admitting: *Deleted

## 2019-05-03 VITALS — BP 200/95 | HR 81 | Temp 98.6°F | Resp 16 | Wt 264.6 lb

## 2019-05-03 DIAGNOSIS — I10 Essential (primary) hypertension: Secondary | ICD-10-CM

## 2019-05-03 DIAGNOSIS — E782 Mixed hyperlipidemia: Secondary | ICD-10-CM | POA: Diagnosis not present

## 2019-05-03 DIAGNOSIS — R3915 Urgency of urination: Secondary | ICD-10-CM | POA: Diagnosis not present

## 2019-05-03 DIAGNOSIS — E119 Type 2 diabetes mellitus without complications: Secondary | ICD-10-CM | POA: Diagnosis not present

## 2019-05-03 MED ORDER — TAMSULOSIN HCL 0.4 MG PO CAPS: 0.4000 mg | ORAL_CAPSULE | Freq: Every day | ORAL | 0 refills | Status: AC

## 2019-05-03 NOTE — Progress Notes (Signed)
Patient: Eric Lawrence Male    DOB: [birth year]   53 y.o.   MRN: [id number] Visit Date: 05/03/2019  Today's Provider: Trinna Post, PA-C   Chief Complaint  Patient presents with  . Referral   Subjective:     HPI Patient requesting referral to nephrology and urology. Patient reports he has been to see his cardiologist Dr. Neoma Laming and reports he need approval for a procedure regarding his prostate. He is unsure what this is. He reports he is having frequency urination and urgency. He thinks this may be due to the cysts on his kidneys and would like a referral to nephrology. He denies fevers, chills, burning with urination. PSA last year was normal.    HTN: He is not taking his medications.   BP Readings from Last 3 Encounters:  05/03/19 (!) 200/95  04/21/19 (!) 197/108  12/23/18 (!) 171/97   DM II: Reports he is eating smaller portions and has lost weight.   Lab Results  Component Value Date   HGBA1C 6.3 (A) 04/21/2019    Allergies  Allergen Reactions  . Cephalexin Rash    severe   Wt Readings from Last 3 Encounters:  05/03/19 264 lb 9.6 oz (120 kg)  04/21/19 267 lb 12.8 oz (121.5 kg)  12/23/18 272 lb 9.6 oz (123.7 kg)      Current Outpatient Medications:  .  acetaminophen (TYLENOL) 325 MG tablet, Take 325 mg by mouth daily. , Disp: , Rfl:  .  albuterol (PROVENTIL HFA;VENTOLIN HFA) 108 (90 Base) MCG/ACT inhaler, Inhale 2 puffs every 4 (four) hours as needed into the lungs for wheezing or shortness of breath., Disp: 1 Inhaler, Rfl: 3 .  amLODipine (NORVASC) 10 MG tablet, Take 1 tablet (10 mg total) by mouth daily., Disp: 90 tablet, Rfl: 0 .  blood glucose meter kit and supplies, Check glucose once a day.  (FOR ICD-10 E10.9, E11.9)., Disp: 1 each, Rfl: 0 .  buPROPion (WELLBUTRIN XL) 300 MG 24 hr tablet, Take 300 mg by mouth 2 (two) times daily. , Disp: , Rfl:  .  Fluticasone-Salmeterol (ADVAIR DISKUS) 250-50 MCG/DOSE AEPB, Inhale 1 puff into the lungs 2  (two) times daily., Disp: 1 each, Rfl: 3 .  ibuprofen (ADVIL,MOTRIN) 600 MG tablet, Take 1 tablet (600 mg total) by mouth every 8 (eight) hours as needed for mild pain or moderate pain., Disp: 30 tablet, Rfl: 0 .  lisinopril (PRINIVIL,ZESTRIL) 40 MG tablet, Take 1 tablet (40 mg total) by mouth daily., Disp: 90 tablet, Rfl: 3 .  Multiple Vitamin (MULTIVITAMIN WITH MINERALS) TABS tablet, Take 1 tablet by mouth daily., Disp: , Rfl:  .  nicotine (NICODERM CQ - DOSED IN MG/24 HOURS) 21 mg/24hr patch, Place 21 mg onto the skin daily. , Disp: , Rfl: 3 .  OXYGEN, Inhale 3 L into the lungs at bedtime. , Disp: , Rfl:  .  triamterene-hydrochlorothiazide (MAXZIDE-25) 37.5-25 MG tablet, Take 1 tablet by mouth daily., Disp: 90 tablet, Rfl: 3  Review of Systems  Social History   Tobacco Use  . Smoking status: Former Smoker    Packs/day: 0.25    Types: Cigarettes  . Smokeless tobacco: Never Used  Substance Use Topics  . Alcohol use: Not Currently    Alcohol/week: 0.0 standard drinks      Objective:   BP (!) 200/95 (BP Location: Left Arm, Patient Position: Sitting, Cuff Size: Large)   Pulse 81   Temp 98.6 F (37 C) (Oral)  Resp 16   Wt 264 lb 9.6 oz (120 kg)   BMI 33.97 kg/m  Vitals:   05/03/19 1027  BP: (!) 200/95  Pulse: 81  Resp: 16  Temp: 98.6 F (37 C)  TempSrc: Oral  Weight: 264 lb 9.6 oz (120 kg)     Physical Exam Constitutional:      Appearance: Normal appearance.  Cardiovascular:     Rate and Rhythm: Normal rate and regular rhythm.     Pulses: Normal pulses.  Pulmonary:     Effort: Pulmonary effort is normal.     Breath sounds: Normal breath sounds.  Genitourinary:    Prostate: Not tender and no nodules present.     Rectum: Normal.  Skin:    General: Skin is warm and dry.  Neurological:     Mental Status: He is alert and oriented to person, place, and time. Mental status is at baseline.      No results found for any visits on 05/03/19.     Assessment & Plan     1. Urinary urgency  Suspect BPH. Prostate exam benign. Will start Flomax as below. I am not sure what procedure his cardiologist would need regarding his prostate. Counseled patient that I do not think his renal cysts are causing his urinary symptoms.   - PSA Total (Reflex To Free) - tamsulosin (FLOMAX) 0.4 MG CAPS capsule; Take 1 capsule (0.4 mg total) by mouth daily.  Dispense: 90 capsule; Refill: 0  2. Diabetes mellitus without complication ([place])  CCM has tried to reach out. Advised patient they are trying to call him regarding his disease education.   3. Essential hypertension  Uncontrolled. He is not taking his medications and frankly I am not sure why. Hopefully CCM can help investigate this further.   4. Mixed hyperlipidemia  Not currently on statin, will need to start next visit..   The entirety of the information documented in the History of Present Illness, Review of Systems and Physical Exam were personally obtained by me. Portions of this information were initially documented by Lynford Humphrey, CMA and reviewed by me for thoroughness and accuracy.   F/u PRN     Trinna Post, PA-C  [facility]

## 2019-05-03 NOTE — Patient Instructions (Signed)
Thank you allowing the Chronic Care Management Team to be a part of your care! It was a pleasure speaking with you today!    CCM (Chronic Care Management) Team   Trish Fountain RN, BSN Nurse Care Coordinator  [phone]   Eric Lawrence is a 53 y.o. year old male who is a primary care patient of Trinna Post, [place]. The CCM was consulted to assist the patient with disease management.   [name] was given information about Chronic Care Management services today including:  1. CCM service includes personalized support from designated clinical staff supervised by his physician, including individualized plan of care and coordination with other care providers 2. 24/7 contact phone numbers for assistance for urgent and routine care needs. 3. Service will only be billed when office clinical staff spend 20 minutes or more in a month to coordinate care. 4. Only one practitioner may furnish and bill the service in a calendar month. 5. The patient may stop CCM services at any time (effective at the end of the month) by phone call to the office staff. 6. The patient will be responsible for cost sharing (co-pay) of up to 20% of the service fee (after annual deductible is met).  Ruben Reason PharmD  Clinical Pharmacist  [phone]   [place], [place] Worker [phone]  Goals Addressed   None      The patient verbalized understanding of instructions provided today and declined a print copy of patient instruction materials.   Telephone follow up appointment with care management team member scheduled for:05/19/19

## 2019-05-03 NOTE — Progress Notes (Signed)
This encounter was created in error - please disregard.

## 2019-05-03 NOTE — Chronic Care Management (AMB) (Signed)
Eric Lawrence [facility]  Chronic Care Management    Clinical Social Work General Note  05/03/2019 Name: Eric Lawrence MRN: [id number] DOB: Aug 31, 1966  [name] is a 53 y.o. year old male who is a primary care patient of Trinna Post, [place]. The CCM was consulted to assist the patient with disease management.   [name] was given information about Chronic Care Management services today including:  1. CCM service includes personalized support from designated clinical staff supervised by his physician, including individualized plan of care and coordination with other care providers 2. 24/7 contact phone numbers for assistance for urgent and routine care needs. 3. Service will only be billed when office clinical staff spend 20 minutes or more in a month to coordinate care. 4. Only one practitioner may furnish and bill the service in a calendar month. 5. The patient may stop CCM services at any time (effective at the end of the month) by phone call to the office staff. 6. The patient will be responsible for cost sharing (co-pay) of up to 20% of the service fee (after annual deductible is met).  Patient agreed to services and verbal consent obtained.   Review of patient status, including review of consultants reports, relevant laboratory and other test results, and collaboration with appropriate care team members and the patient's provider was performed as part of comprehensive patient evaluation and provision of chronic care management services.    SDOH (Social Determinants of Health) screening performed today. See Care Plan Entry related to challenges with: Depression   Stress   Per patient, he is active with a therapist and psychiatrist in [place], [place]   None      Follow Up Plan: Appointment scheduled for RNCM to follow up with client by phone on: 05/19/19 at 11:00 am        Elliot Gurney, [place] Worker  [facility] Care [facility]  [phone]

## 2019-05-03 NOTE — Patient Instructions (Signed)

## 2019-05-04 ENCOUNTER — Telehealth: Payer: Self-pay

## 2019-05-04 LAB — PSA TOTAL (REFLEX TO FREE): Prostate Specific Ag, Serum: 0.6 ng/mL (ref 0.0–4.0)

## 2019-05-04 NOTE — Telephone Encounter (Signed)
Patient advised as below.  

## 2019-05-04 NOTE — Telephone Encounter (Signed)
-----   Message from Trinna Post, [place] sent at 05/04/2019  8:17 AM EDT ----- PSA is normal.

## 2019-05-19 ENCOUNTER — Telehealth: Payer: Medicare Other

## 2019-05-19 ENCOUNTER — Ambulatory Visit: Payer: Self-pay

## 2019-05-19 DIAGNOSIS — J449 Chronic obstructive pulmonary disease, unspecified: Secondary | ICD-10-CM | POA: Diagnosis not present

## 2019-05-19 DIAGNOSIS — E119 Type 2 diabetes mellitus without complications: Secondary | ICD-10-CM

## 2019-05-19 DIAGNOSIS — I720 Aneurysm of carotid artery: Secondary | ICD-10-CM | POA: Diagnosis not present

## 2019-05-19 NOTE — Chronic Care Management (AMB) (Signed)
   Chronic Care Management   Unsuccessful Call Note 05/19/2019 Name: Eric Lawrence MRN: [id number] DOB: 1965-11-06  Eric Lawrence is a 53 year old male who sees Eric Lawrence, Eric Lawrence for primary care. Eric Lawrence asked the CCM team to consult the patient for chronic case management/disease management/care coordination secondary to his need for diabetes education and self care management. Referral was placed 04/21/2019. Patient's last office visit was 05/03/2019.     Was unable to reach patient via telephone today for his initial assessment and goal setting appointment. I have left HIPAA compliant voicemail asking patient to return my call. (unsuccessful outreach #1).   Plan: Will follow-up within 7 business days via telephone.   Eric Lawrence E. Rollene Rotunda, RN, BSN Nurse Care Coordinator [facility] Care [facility] [phone]

## 2019-05-24 ENCOUNTER — Ambulatory Visit: Payer: Medicare Other

## 2019-05-24 ENCOUNTER — Other Ambulatory Visit: Payer: Self-pay

## 2019-05-24 DIAGNOSIS — E119 Type 2 diabetes mellitus without complications: Secondary | ICD-10-CM

## 2019-05-24 DIAGNOSIS — I10 Essential (primary) hypertension: Secondary | ICD-10-CM

## 2019-05-24 DIAGNOSIS — F17200 Nicotine dependence, unspecified, uncomplicated: Secondary | ICD-10-CM

## 2019-05-24 NOTE — Chronic Care Management (AMB) (Signed)
  Chronic Care Management   Note  05/24/2019 Name: ARRAN FESSEL MRN: [id number] DOB: [birth year]  Care Coordination: Successful telephone encounter with Mr. Khyrin Trevathan who was referred to chronic case management secondary to his need for prediabetes education. Mr. Festa was consented to services and his initial telephone assissment with RN CM was scheduled. Unfortunately, Mr. Farino was not available during his telephone appointment time. Today RN CM reached out to Mr. Stutsman via telephone to see if he would like to reschedule.  Plan: initial telephone assessment with RN CM rescheduled for 06/10/2019 at 1:00pm  Ovella Manygoats E. Rollene Rotunda, RN, BSN Nurse Care Coordinator [facility] [phone]

## 2019-05-31 ENCOUNTER — Telehealth: Payer: Medicare Other

## 2019-06-10 ENCOUNTER — Ambulatory Visit: Payer: Self-pay

## 2019-06-10 ENCOUNTER — Telehealth: Payer: Medicare Other

## 2019-06-10 DIAGNOSIS — E119 Type 2 diabetes mellitus without complications: Secondary | ICD-10-CM

## 2019-06-10 NOTE — Chronic Care Management (AMB) (Signed)
   Chronic Care Management   Unsuccessful Call Note 05/19/2019 Name: Eric Lawrence         MRN: [id number]       DOB: 08-26-1966  Eric Lawrence is a 53 year old malewho sees Eric Lawrence, [place] for primary care. Ms. Eric Lawrence the CCM team to consult the patient for chronic case management/disease management/care coordination secondary to his need for diabetes education and self care management. Referral was placed6/18/2020. Patient's last office visit was 05/03/2019.   Was unable to reach patient via telephone today forhis initial assessment and goal setting appointment. This is the second scheduled appointment patient has not been available for as agreed upon. Ihave left HIPAA compliant voicemail asking patient to return my call. (unsuccessful outreach #1).  Plan: Will follow-up within 7business days via telephone.   Eric [place] E. Rollene Rotunda, RN, BSN Nurse Care Coordinator [facility] Care Management [phone]

## 2019-06-19 DIAGNOSIS — I720 Aneurysm of carotid artery: Secondary | ICD-10-CM | POA: Diagnosis not present

## 2019-06-19 DIAGNOSIS — J449 Chronic obstructive pulmonary disease, unspecified: Secondary | ICD-10-CM | POA: Diagnosis not present

## 2019-06-21 ENCOUNTER — Telehealth: Payer: Self-pay

## 2019-06-21 ENCOUNTER — Ambulatory Visit: Payer: Self-pay

## 2019-06-21 NOTE — Chronic Care Management (AMB) (Signed)
   Chronic Care Management   Outreach Note  06/21/2019 Name: Eric Lawrence MRN: [id number] DOB: 1965-11-18  Referred by: Trinna Post, PA-C Reason for referral : Chronic Care Management (attempt to rescheule initial assessment)   A second unsuccessful telephone outreach was attempted today. The patient was referred to the case management team for assistance with chronic care management and care coordination.   Follow Up Plan: The care management team will reach out to the patient again over the next 7 days.   Genice Kimberlin E. Rollene Rotunda, RN, BSN Nurse Care Coordinator [facility] Care Management [phone]

## 2019-06-29 ENCOUNTER — Ambulatory Visit: Payer: Self-pay

## 2019-06-29 ENCOUNTER — Telehealth: Payer: Self-pay

## 2019-06-29 DIAGNOSIS — E119 Type 2 diabetes mellitus without complications: Secondary | ICD-10-CM

## 2019-06-29 NOTE — Chronic Care Management (AMB) (Signed)
  Chronic Care Management   Outreach Note  06/21/2019 Name: KATLIN CISZEWSKI         MRN: [id number]       DOB: 1965/12/31  Referred by: Trinna Post, PA-C Reason for referral : Chronic Care Management (attempt to rescheule initial assessment)   A third unsuccessful telephone outreach was attempted today. 3rd [employer] compliant VM left requesting call back. The patient was referred to the case management team for assistance with chronic care management and care coordination.   Follow Up Plan: All outreach calls willbe suspended. CCM Team will be happy to engage patient should he return call.  Agatha Duplechain E. Rollene Rotunda, RN, BSN Nurse Care Coordinator [facility] [phone]

## 2019-07-20 DIAGNOSIS — I720 Aneurysm of carotid artery: Secondary | ICD-10-CM | POA: Diagnosis not present

## 2019-07-20 DIAGNOSIS — J449 Chronic obstructive pulmonary disease, unspecified: Secondary | ICD-10-CM | POA: Diagnosis not present

## 2019-07-22 ENCOUNTER — Other Ambulatory Visit: Payer: Self-pay

## 2019-07-22 ENCOUNTER — Encounter: Payer: Self-pay | Admitting: Physician Assistant

## 2019-07-22 ENCOUNTER — Ambulatory Visit (INDEPENDENT_AMBULATORY_CARE_PROVIDER_SITE_OTHER): Payer: Medicare Other | Admitting: Physician Assistant

## 2019-07-22 VITALS — BP 179/117 | HR 86 | Temp 97.3°F | Resp 16 | Wt 265.2 lb

## 2019-07-22 DIAGNOSIS — Z23 Encounter for immunization: Secondary | ICD-10-CM

## 2019-07-22 DIAGNOSIS — E785 Hyperlipidemia, unspecified: Secondary | ICD-10-CM

## 2019-07-22 DIAGNOSIS — F3340 Major depressive disorder, recurrent, in remission, unspecified: Secondary | ICD-10-CM | POA: Insufficient documentation

## 2019-07-22 DIAGNOSIS — J410 Simple chronic bronchitis: Secondary | ICD-10-CM

## 2019-07-22 DIAGNOSIS — Z91148 Patient's other noncompliance with medication regimen for other reason: Secondary | ICD-10-CM

## 2019-07-22 DIAGNOSIS — E1169 Type 2 diabetes mellitus with other specified complication: Secondary | ICD-10-CM | POA: Diagnosis not present

## 2019-07-22 DIAGNOSIS — I10 Essential (primary) hypertension: Secondary | ICD-10-CM

## 2019-07-22 DIAGNOSIS — E119 Type 2 diabetes mellitus without complications: Secondary | ICD-10-CM | POA: Diagnosis not present

## 2019-07-22 DIAGNOSIS — Z9114 Patient's other noncompliance with medication regimen: Secondary | ICD-10-CM | POA: Diagnosis not present

## 2019-07-22 DIAGNOSIS — I495 Sick sinus syndrome: Secondary | ICD-10-CM

## 2019-07-22 LAB — POCT GLYCOSYLATED HEMOGLOBIN (HGB A1C)
Est. average glucose Bld gHb Est-mCnc: 137
Hemoglobin A1C: 6.4 % — AB (ref 4.0–5.6)

## 2019-07-22 MED ORDER — ATORVASTATIN CALCIUM 10 MG PO TABS: 10.0000 mg | ORAL_TABLET | Freq: Every day | ORAL | 3 refills | Status: DC

## 2019-07-22 NOTE — Progress Notes (Signed)
Patient: Eric Lawrence Male    DOB: Feb 26, 1966   53 y.o.   MRN: [id number] Visit Date: 07/22/2019  Today's Provider: Trinna Post, PA-C   Chief Complaint  Patient presents with  . Diabetes  . Hypertension   Subjective:      HPI  Diabetes Mellitus Type II, Follow-up:   Lab Results  Component Value Date   HGBA1C 6.4 (A) 07/22/2019   HGBA1C 6.3 (A) 04/21/2019   HGBA1C 6.7 (H) 12/23/2018    Last seen for diabetes 3 months ago.  Management since then includes no changes. Drinking sweet tea but not all the time, lemonade with sweet and low and water.  He reports excellent compliance with treatment. He is not having side effects.  Current symptoms include none and have been stable. Home blood sugar records: fasting range: 150  Episodes of hypoglycemia? no   Current insulin regiment: Is not on insulin Most Recent Eye Exam: no showed his eye appointment on 06/20/2019 at [facility] - says he wasn't aware of this appointment and didn't write it down.  Weight trend: stable Prior visit with dietician: No Current exercise: walking Current diet habits: in general, a "healthy" diet    Pertinent Labs:    Component Value Date/Time   CHOL 129 08/05/2018 1012   TRIG 176 (H) 08/05/2018 1012   HDL 30 (L) 08/05/2018 1012   LDLCALC 64 08/05/2018 1012   LDLCALC 103 (H) 12/07/2017 0823   CREATININE 1.20 12/23/2018 1117   CREATININE 1.06 12/07/2017 0823    Wt Readings from Last 3 Encounters:  07/22/19 265 lb 3.2 oz (120.3 kg)  05/03/19 264 lb 9.6 oz (120 kg)  04/21/19 267 lb 12.8 oz (121.5 kg)   ------------------------------------------------------------------------  Hypertension, follow-up:  BP Readings from Last 3 Encounters:  07/22/19 (!) 179/117  05/03/19 (!) 200/95  04/21/19 (!) 197/108    He was last seen for hypertension 3 months ago.  BP at that visit was 200/95. Management changes since that visit include no changes. He is not taking any of his  blood pressure medications. He says he doesn't think they work. He said he is watching his sodium instead. He reports one of his cardiologists told him that if he wasn't going to focus on reducing his sodium the medications weren't going to do anything.  He is not having side effects.  He is exercising. He is adherent to low salt diet.   Outside blood pressures are stable. He is experiencing irregular heart beat.  Patient denies chest pain and lower extremity edema.   Cardiovascular risk factors include diabetes mellitus, family history of premature cardiovascular disease and hypertension.  Use of agents associated with hypertension: none.     Weight trend: stable Wt Readings from Last 3 Encounters:  07/22/19 265 lb 3.2 oz (120.3 kg)  05/03/19 264 lb 9.6 oz (120 kg)  04/21/19 267 lb 12.8 oz (121.5 kg)    Current diet: in general, a "healthy" diet    Sick Sinus Syndrome: He had a dual chamber pacemaker placed last year by Dr. Saralyn Pilar at [facility] for sick sinus syndrome. He reports that he has been waking up every other night for the past two weeks to his heart beating very slowly. When he checked his pulse it was 37. He did not feel faint or lose consciousness. He reports it resolved after a few minutes.   Depression and anxiety: Prescribed wellbutrin 300 mg daily.  ------------------------------------------------------------------------   Allergies  Allergen Reactions  . Cephalexin Rash    severe     Current Outpatient Medications:  .  acetaminophen (TYLENOL) 325 MG tablet, Take 325 mg by mouth daily. , Disp: , Rfl:  .  albuterol (PROVENTIL HFA;VENTOLIN HFA) 108 (90 Base) MCG/ACT inhaler, Inhale 2 puffs every 4 (four) hours as needed into the lungs for wheezing or shortness of breath., Disp: 1 Inhaler, Rfl: 3 .  amLODipine (NORVASC) 10 MG tablet, Take 1 tablet (10 mg total) by mouth daily., Disp: 90 tablet, Rfl: 0 .  blood glucose meter kit and supplies, Check glucose  once a day.  (FOR ICD-10 E10.9, E11.9)., Disp: 1 each, Rfl: 0 .  buPROPion (WELLBUTRIN XL) 300 MG 24 hr tablet, Take 300 mg by mouth 2 (two) times daily. , Disp: , Rfl:  .  Fluticasone-Salmeterol (ADVAIR DISKUS) 250-50 MCG/DOSE AEPB, Inhale 1 puff into the lungs 2 (two) times daily., Disp: 1 each, Rfl: 3 .  ibuprofen (ADVIL,MOTRIN) 600 MG tablet, Take 1 tablet (600 mg total) by mouth every 8 (eight) hours as needed for mild pain or moderate pain., Disp: 30 tablet, Rfl: 0 .  lisinopril (PRINIVIL,ZESTRIL) 40 MG tablet, Take 1 tablet (40 mg total) by mouth daily., Disp: 90 tablet, Rfl: 3 .  Multiple Vitamin (MULTIVITAMIN WITH MINERALS) TABS tablet, Take 1 tablet by mouth daily., Disp: , Rfl:  .  nicotine (NICODERM CQ - DOSED IN MG/24 HOURS) 21 mg/24hr patch, Place 21 mg onto the skin daily. , Disp: , Rfl: 3 .  OXYGEN, Inhale 3 L into the lungs at bedtime. , Disp: , Rfl:  .  tamsulosin (FLOMAX) 0.4 MG CAPS capsule, Take 1 capsule (0.4 mg total) by mouth daily., Disp: 90 capsule, Rfl: 0 .  triamterene-hydrochlorothiazide (MAXZIDE-25) 37.5-25 MG tablet, Take 1 tablet by mouth daily., Disp: 90 tablet, Rfl: 3  Review of Systems  Constitutional: Negative.   Eyes: Negative.   Respiratory: Negative.   Cardiovascular: Negative.   Endocrine: Negative.     Social History   Tobacco Use  . Smoking status: Former Smoker    Packs/day: 0.25    Types: Cigarettes  . Smokeless tobacco: Never Used  Substance Use Topics  . Alcohol use: Not Currently    Alcohol/week: 0.0 standard drinks      Objective:   BP (!) 179/117 (BP Location: Left Arm, Patient Position: Sitting, Cuff Size: Large)   Pulse 86   Temp (!) 97.3 F (36.3 C) (Temporal)   Resp 16   Wt 265 lb 3.2 oz (120.3 kg)   BMI 34.05 kg/m  Vitals:   07/22/19 0941  BP: (!) 179/117  Pulse: 86  Resp: 16  Temp: (!) 97.3 F (36.3 C)  TempSrc: Temporal  Weight: 265 lb 3.2 oz (120.3 kg)  Body mass index is 34.05 kg/m.   Physical  Exam Constitutional:      Appearance: Normal appearance. He is obese.  Cardiovascular:     Rate and Rhythm: Normal rate and regular rhythm.     Heart sounds: Normal heart sounds.  Pulmonary:     Effort: Pulmonary effort is normal. No respiratory distress.     Breath sounds: Wheezing present.  Skin:    General: Skin is warm and dry.  Neurological:     Mental Status: He is alert and oriented to person, place, and time. Mental status is at baseline.  Psychiatric:        Mood and Affect: Mood normal.        Behavior: Behavior normal.  Results for orders placed or performed in visit on 07/22/19  POCT glycosylated hemoglobin (Hb A1C)  Result Value Ref Range   Hemoglobin A1C 6.4 (A) 4.0 - 5.6 %   Est. average glucose Bld gHb Est-mCnc 137        Assessment & Plan    1. Noncompliance with medication regimen  Patient is noncompliant with his blood pressure medications and likely most of his other medications as well. He is not taking a daily inhaler and continues to smoke despite having COPD. Not currently taking a statin. He reports he is watching his sodium because he feels the BP medicines don't work. I doubt he ever took them appropriately to make this claim  He has been advised in explicit terms that I think he will have a stroke, heart attack or heart failure from his multiple untreated medical issues. I have advised him that I think he will die or at least experience great morbidity from these.   Patient does not seem to grasp the seriousness of this situation and doesn't express any desire or motivation to start taking medications. If he continues like this, I truthfully do wonder what purpose I serve in the context of his medical illnesses if he is not going to take the medication. He would likely be better served by somebody else in the future or by his specialists for the procedures he is actually willing to do.   2. Diabetes mellitus without complication ([place])  No showed  [place] appointment on 06/20/2019. He said he didn't know he had an appointment. Counseled the appointment cannot have been made without his knowledge and he needs to write this down. We will reach out to [facility] to have them reschedule patient.   - POCT glycosylated hemoglobin (Hb A1C) - atorvastatin (LIPITOR) 10 MG tablet; Take 1 tablet (10 mg total) by mouth daily.  Dispense: 90 tablet; Refill: 3  3. Hyperlipidemia associated with type 2 diabetes mellitus ([place])  Needs to take statin, start Lipitor 10 mg QHS. I doubt he will be compliant with this.   4. Essential hypertension  Uncontrolled, not taking medications. See #1.   5. Need for influenza vaccination  - Flu Vaccine QUAD 36+ mos IM  6. Sick sinus syndrome ([facility])  Reports episodes of bradycardia at night. I have called [facility] and requested their clinic reach out to him and schedule appointment to follow this up as I do not believe patient will do this himself.  7. Recurrent major depressive disorder, in remission ([place])  On Buproprion allegedly.   8. COPD   Continues to smoke. Not compliant with maintenance inhaler. Diffuse wheezing on exam today.   The entirety of the information documented in the History of Present Illness, Review of Systems and Physical Exam were personally obtained by me. Portions of this information were initially documented by Lynford Humphrey, CMA and reviewed by me for thoroughness and accuracy.   F/u 3 months chronic       Trinna Post, PA-C  [facility]

## 2019-07-22 NOTE — Patient Instructions (Signed)
Diabetes Mellitus and Exercise Exercising regularly is important for your overall health, especially when you have diabetes (diabetes mellitus). Exercising is not only about losing weight. It has many other health benefits, such as increasing muscle strength and bone density and reducing body fat and stress. This leads to improved fitness, flexibility, and endurance, all of which result in better overall health. Exercise has additional benefits for people with diabetes, including:  Reducing appetite.  Helping to lower and control blood glucose.  Lowering blood pressure.  Helping to control amounts of fatty substances (lipids) in the blood, such as cholesterol and triglycerides.  Helping the body to respond better to insulin (improving insulin sensitivity).  Reducing how much insulin the body needs.  Decreasing the risk for heart disease by: ? Lowering cholesterol and triglyceride levels. ? Increasing the levels of good cholesterol. ? Lowering blood glucose levels. What is my activity plan? Your health care provider or certified diabetes educator can help you make a plan for the type and frequency of exercise (activity plan) that works for you. Make sure that you:  Do at least 150 minutes of moderate-intensity or vigorous-intensity exercise each week. This could be brisk walking, biking, or water aerobics. ? Do stretching and strength exercises, such as yoga or weightlifting, at least 2 times a week. ? Spread out your activity over at least 3 days of the week.  Get some form of physical activity every day. ? Do not go more than 2 days in a row without some kind of physical activity. ? Avoid being inactive for more than 30 minutes at a time. Take frequent breaks to walk or stretch.  Choose a type of exercise or activity that you enjoy, and set realistic goals.  Start slowly, and gradually increase the intensity of your exercise over time. What do I need to know about managing my  diabetes?   Check your blood glucose before and after exercising. ? If your blood glucose is 240 mg/dL (13.3 mmol/L) or higher before you exercise, check your urine for ketones. If you have ketones in your urine, do not exercise until your blood glucose returns to normal. ? If your blood glucose is 100 mg/dL (5.6 mmol/L) or lower, eat a snack containing 15-20 grams of carbohydrate. Check your blood glucose 15 minutes after the snack to make sure that your level is above 100 mg/dL (5.6 mmol/L) before you start your exercise.  Know the symptoms of low blood glucose (hypoglycemia) and how to treat it. Your risk for hypoglycemia increases during and after exercise. Common symptoms of hypoglycemia can include: ? Hunger. ? Anxiety. ? Sweating and feeling clammy. ? Confusion. ? Dizziness or feeling light-headed. ? Increased heart rate or palpitations. ? Blurry vision. ? Tingling or numbness around the mouth, lips, or tongue. ? Tremors or shakes. ? Irritability.  Keep a rapid-acting carbohydrate snack available before, during, and after exercise to help prevent or treat hypoglycemia.  Avoid injecting insulin into areas of the body that are going to be exercised. For example, avoid injecting insulin into: ? The arms, when playing tennis. ? The legs, when jogging.  Keep records of your exercise habits. Doing this can help you and your health care provider adjust your diabetes management plan as needed. Write down: ? Food that you eat before and after you exercise. ? Blood glucose levels before and after you exercise. ? The type and amount of exercise you have done. ? When your insulin is expected to peak, if you use   insulin. Avoid exercising at times when your insulin is peaking.  When you start a new exercise or activity, work with your health care provider to make sure the activity is safe for you, and to adjust your insulin, medicines, or food intake as needed.  Drink plenty of water while  you exercise to prevent dehydration or heat stroke. Drink enough fluid to keep your urine clear or pale yellow. Summary  Exercising regularly is important for your overall health, especially when you have diabetes (diabetes mellitus).  Exercising has many health benefits, such as increasing muscle strength and bone density and reducing body fat and stress.  Your health care provider or certified diabetes educator can help you make a plan for the type and frequency of exercise (activity plan) that works for you.  When you start a new exercise or activity, work with your health care provider to make sure the activity is safe for you, and to adjust your insulin, medicines, or food intake as needed. This information is not intended to replace advice given to you by your health care provider. Make sure you discuss any questions you have with your health care provider. Document Released: 01/10/2004 Document Revised: 05/14/2017 Document Reviewed: 03/31/2016 Elsevier Patient Education  2020 Elsevier Inc.  

## 2019-07-26 ENCOUNTER — Telehealth: Payer: Self-pay | Admitting: Physician Assistant

## 2019-07-26 NOTE — Telephone Encounter (Signed)
Needing test strips for new meter - pt is out cannot test.   Please call into:  [facility] ([facility] [place] [facility] [place] [phone] (Phone) [phone] (Fax)    Thanks, [employer]

## 2019-07-26 NOTE — Telephone Encounter (Signed)
Need to know what meter he has.  Pt will call back with that info when he get home.    Thanks,   -Mickel Baas

## 2019-08-19 DIAGNOSIS — I720 Aneurysm of carotid artery: Secondary | ICD-10-CM | POA: Diagnosis not present

## 2019-08-19 DIAGNOSIS — J449 Chronic obstructive pulmonary disease, unspecified: Secondary | ICD-10-CM | POA: Diagnosis not present

## 2019-08-21 ENCOUNTER — Emergency Department: Payer: Medicare Other

## 2019-08-21 ENCOUNTER — Emergency Department
Admission: EM | Admit: 2019-08-21 | Discharge: 2019-08-21 | Disposition: A | Discharge status: Home / Self Care | Payer: Medicare Other | Attending: Emergency Medicine | Admitting: Emergency Medicine

## 2019-08-21 ENCOUNTER — Other Ambulatory Visit: Payer: Self-pay

## 2019-08-21 DIAGNOSIS — I10 Essential (primary) hypertension: Secondary | ICD-10-CM | POA: Insufficient documentation

## 2019-08-21 DIAGNOSIS — Z950 Presence of cardiac pacemaker: Secondary | ICD-10-CM | POA: Diagnosis not present

## 2019-08-21 DIAGNOSIS — E119 Type 2 diabetes mellitus without complications: Secondary | ICD-10-CM | POA: Insufficient documentation

## 2019-08-21 DIAGNOSIS — J449 Chronic obstructive pulmonary disease, unspecified: Secondary | ICD-10-CM | POA: Diagnosis not present

## 2019-08-21 DIAGNOSIS — Z87891 Personal history of nicotine dependence: Secondary | ICD-10-CM | POA: Diagnosis not present

## 2019-08-21 DIAGNOSIS — Z79899 Other long term (current) drug therapy: Secondary | ICD-10-CM | POA: Diagnosis not present

## 2019-08-21 DIAGNOSIS — R5383 Other fatigue: Secondary | ICD-10-CM | POA: Diagnosis present

## 2019-08-21 DIAGNOSIS — I4729 Other ventricular tachycardia: Secondary | ICD-10-CM

## 2019-08-21 DIAGNOSIS — R42 Dizziness and giddiness: Secondary | ICD-10-CM

## 2019-08-21 DIAGNOSIS — I472 Ventricular tachycardia: Secondary | ICD-10-CM | POA: Insufficient documentation

## 2019-08-21 LAB — BASIC METABOLIC PANEL
Anion gap: 12 (ref 5–15)
BUN: 23 mg/dL — ABNORMAL HIGH (ref 6–20)
CO2: 27 mmol/L (ref 22–32)
Calcium: 10.2 mg/dL (ref 8.9–10.3)
Chloride: 100 mmol/L (ref 98–111)
Creatinine, Ser: 1.18 mg/dL (ref 0.61–1.24)
GFR calc Af Amer: >60 mL/min (ref >60)
GFR calc non Af Amer: >60 mL/min (ref >60)
Glucose, Bld: 146 mg/dL — ABNORMAL HIGH (ref 70–99)
Potassium: 4.2 mmol/L (ref 3.5–5.1)
Sodium: 139 mmol/L (ref 135–145)

## 2019-08-21 LAB — URINALYSIS, COMPLETE (UACMP) WITH MICROSCOPIC
Bacteria, UA: NONE SEEN
Bilirubin Urine: NEGATIVE
Glucose, UA: NEGATIVE mg/dL
Hgb urine dipstick: NEGATIVE
Ketones, ur: NEGATIVE mg/dL
Leukocytes,Ua: NEGATIVE
Nitrite: NEGATIVE
Protein, ur: NEGATIVE mg/dL
Specific Gravity, Urine: 1.015 (ref 1.005–1.030)
Squamous Epithelial / HPF: NONE SEEN (ref 0–5)
pH: 7 (ref 5.0–8.0)

## 2019-08-21 LAB — CBC
HCT: 47.2 % (ref 39.0–52.0)
Hemoglobin: 16.5 g/dL (ref 13.0–17.0)
MCH: 31.1 pg (ref 26.0–34.0)
MCHC: 35 g/dL (ref 30.0–36.0)
MCV: 88.9 fL (ref 80.0–100.0)
Platelets: 176 10*3/uL (ref 150–400)
RBC: 5.31 MIL/uL (ref 4.22–5.81)
RDW: 12.8 % (ref 11.5–15.5)
WBC: 8.8 10*3/uL (ref 4.0–10.5)
nRBC: 0 % (ref 0.0–0.2)

## 2019-08-21 LAB — TROPONIN I (HIGH SENSITIVITY)
Troponin I (High Sensitivity): 8 ng/L (ref <18)
Troponin I (High Sensitivity): 10 ng/L (ref <18)

## 2019-08-21 LAB — T4, FREE: Free T4: 0.76 ng/dL (ref 0.61–1.12)

## 2019-08-21 NOTE — ED Notes (Signed)
Patients pacemaker interrogated with [employer] equipment.

## 2019-08-21 NOTE — ED Notes (Signed)
Repeat troponin sent to lab

## 2019-08-21 NOTE — ED Provider Notes (Addendum)
[facility] Emergency Department Provider Note  ____________________________________________   First MD Initiated Contact with Patient 08/21/19 1813     (approximate)  I have reviewed the triage vital signs and the nursing notes.   HISTORY  Chief Complaint Fatigue    HPI Eric Lawrence is a 53 y.o. male with COPD, hypertension, anxiety who presents with fatigue.  Patient presents with many different symptoms however he says that he is to most notable are the swollen lymph nodes in his bilateral neck that have been there for about a month.  Stable to swallow.  No obvious swelling on my examination.  He also says that he feels like his pacemaker is not working and was told that one of his chambers not working.  He reports having heart rates in the 30s at home on a home pulse ox.  Patient has been tired for the past 4 to 5 days.  He is reporting some dizziness that is mild, intermittent, nothing makes it better, nothing makes it worse.  He did quit smoking 3 days ago and is using a nicotine patch.  He reported some numbness to his legs and feet in triage but really denies this to me.  He is equal strength in his legs and equal sensation.     Past Medical History:  Diagnosis Date   Acute cholecystitis 06/29/2017   ADHD    Affective disorder, major 04/19/2015   Airway hyperreactivity 04/19/2015   Anxiety    Anxiety disorder 04/19/2015   Arthritis 04/19/2015   At risk for injury 04/19/2015   CAFL (chronic airflow limitation) ([place]) 04/19/2015   Cholecystitis 06/29/2017   Chronic gastritis 04/19/2015   Compulsive tobacco user syndrome 04/19/2015   COPD (chronic obstructive pulmonary disease) ([place])    Depression    Dysrhythmia    Encounter for immunization 04/19/2015   GERD (gastroesophageal reflux disease)    H/O elevated lipids 04/19/2015   Hypertension    Mechanical and motor problems with internal organs 04/19/2015   Obesity 09/10/2015    Oxygen deficiency    Sleep apnea    Trigger thumb of left hand 04/15/2017    Patient Active Problem List   Diagnosis Date Noted   Recurrent major depressive disorder, in remission ([place]) 07/22/2019   Noncompliance with medication regimen 07/22/2019   Diabetes mellitus without complication ([place]) 76/16/0737   Sick sinus syndrome ([place]) 10/13/2018   Incisional hernia, without obstruction or gangrene    S/P cholecystectomy 06/29/2017   Trigger thumb of left hand 04/15/2017   Obesity 09/10/2015   Anxiety disorder 04/19/2015   Arthritis 04/19/2015   COPD (chronic obstructive pulmonary disease) ([place]) 04/19/2015   Affective disorder, major 04/19/2015   Hyperlipidemia 04/19/2015   Essential hypertension 04/19/2015   Compulsive tobacco user syndrome 04/19/2015   Chronic gastritis 04/19/2015    Past Surgical History:  Procedure Laterality Date   CHOLECYSTECTOMY N/A 08/06/2017   Procedure: LAPAROSCOPIC CHOLECYSTECTOMY;  Surgeon: Florene Glen, MD;  Location: [facility] ORS;  Service: General;  Laterality: N/A;   cyst removal from wrist     [place] N/A 08/30/2018   Procedure: HERNIA REPAIR INCISIONAL;  Surgeon: Olean Ree, MD;  Location: [facility] ORS;  Service: General;  Laterality: N/A;   PACEMAKER INSERTION Left 10/13/2018   Procedure: INSERTION PACEMAKER;  Surgeon: Isaias Cowman, MD;  Location: [facility] ORS;  Service: Cardiovascular;  Laterality: Left;   UPPER GASTROINTESTINAL ENDOSCOPY      Prior to Admission medications   Medication Sig Start  Date End Date Taking? Authorizing Provider  acetaminophen (TYLENOL) 325 MG tablet Take 325 mg by mouth daily.     [provider]  albuterol (PROVENTIL HFA;VENTOLIN HFA) 108 (90 Base) MCG/ACT inhaler Inhale 2 puffs every 4 (four) hours as needed into the lungs for wheezing or shortness of breath. 09/08/17   Karamalegos, Devonne Doughty, DO  amLODipine (NORVASC) 10 MG tablet Take 1 tablet (10 mg total) by  mouth daily. 12/23/18   Trinna Post, PA-C  atorvastatin (LIPITOR) 10 MG tablet Take 1 tablet (10 mg total) by mouth daily. 07/22/19   Trinna Post, PA-C  blood glucose meter kit and supplies Check glucose once a day.  (FOR ICD-10 E10.9, E11.9). 04/25/19   Trinna Post, PA-C  buPROPion (WELLBUTRIN XL) 300 MG 24 hr tablet Take 300 mg by mouth 2 (two) times daily.  12/09/17   [provider]  Fluticasone-Salmeterol (ADVAIR DISKUS) 250-50 MCG/DOSE AEPB Inhale 1 puff into the lungs 2 (two) times daily. 12/23/18   Trinna Post, PA-C  ibuprofen (ADVIL,MOTRIN) 600 MG tablet Take 1 tablet (600 mg total) by mouth every 8 (eight) hours as needed for mild pain or moderate pain. 08/30/18   Olean Ree, MD  lisinopril (PRINIVIL,ZESTRIL) 40 MG tablet Take 1 tablet (40 mg total) by mouth daily. 11/09/18   Trinna Post, PA-C  Multiple Vitamin (MULTIVITAMIN WITH MINERALS) TABS tablet Take 1 tablet by mouth daily.    [provider]  nicotine (NICODERM CQ - DOSED IN MG/24 HOURS) 21 mg/24hr patch Place 21 mg onto the skin daily.  02/25/18   [provider]  OXYGEN Inhale 3 L into the lungs at bedtime.     [provider]  triamterene-hydrochlorothiazide (MAXZIDE-25) 37.5-25 MG tablet Take 1 tablet by mouth daily. 12/23/18   Trinna Post, PA-C    Allergies Cephalexin  Family History  Problem Relation Age of Onset   Stroke Mother 48   Diabetes Mother    Kidney disease Mother    Hypertension Mother    COPD Maternal Aunt    Diabetes Maternal Grandmother    Heart disease Sister     Social History Social History   Tobacco Use   Smoking status: Former Smoker    Packs/day: 0.25    Types: Cigarettes   Smokeless tobacco: Never Used  Substance Use Topics   Alcohol use: Not Currently    Alcohol/week: 0.0 standard drinks   Drug use: No      Review of Systems Constitutional: No fever/chills Eyes: No visual changes. ENT: No sore throat.   Swollen lymph nodes Cardiovascular: Denies chest pain.  Dizziness, low heart rate Respiratory: Denies shortness of breath. Gastrointestinal: No abdominal pain.  No nausea, no vomiting.  No diarrhea.  No constipation. Genitourinary: Negative for dysuria. Musculoskeletal: Negative for back pain. Skin: Negative for rash. Neurological: Negative for headaches, focal weakness or numbness. All other ROS negative ____________________________________________   PHYSICAL EXAM:  VITAL SIGNS: ED Triage Vitals  Enc Vitals Group     BP 08/21/19 1528 (!) 151/100     Pulse Rate 08/21/19 1528 86     Resp 08/21/19 1528 18     Temp 08/21/19 1528 97.9 F (36.6 C)     Temp Source 08/21/19 1528 Oral     SpO2 08/21/19 1528 96 %     Weight 08/21/19 1529 230 lb (104.3 kg)     Height 08/21/19 1529 '6\' 2"'  (1.88 m)     Head Circumference --  Peak Flow --      Pain Score 08/21/19 1529 0     Pain Loc --      Pain Edu? --      Excl. in [place]? --     Constitutional: Alert and oriented. Well appearing and in no acute distress. Eyes: Conjunctivae are normal. EOMI. Head: Atraumatic. Nose: No congestion/rhinnorhea. Mouth/Throat: Mucous membranes are moist.   Neck: No stridor. Trachea Midline. FROM.  No swelling of the neck.  Maybe 1+ lymph nodes bilateral  Cardiovascular: Normal rate, regular rhythm. Grossly normal heart sounds.  Good peripheral circulation. Respiratory: Normal respiratory effort.  No retractions. Lungs CTAB. Gastrointestinal: Soft and nontender. No distention. No abdominal bruits.  Musculoskeletal: No lower extremity tenderness nor edema.  No joint effusions. Neurologic: Cranial nerves II through XII are intact.  Equal strength in bilateral arms and legs.  No evidence of ataxia. Skin:  Skin is warm, dry and intact. No rash noted. Psychiatric: Mood and affect are normal. Speech and behavior are normal. GU: Deferred   ____________________________________________   LABS (all labs  ordered are listed, but only abnormal results are displayed)  Labs Reviewed  BASIC METABOLIC PANEL - Abnormal; Notable for the following components:      Result Value   Glucose, Bld 146 (*)    BUN 23 (*)    All other components within normal limits  URINALYSIS, COMPLETE (UACMP) WITH MICROSCOPIC - Abnormal; Notable for the following components:   Color, Urine YELLOW (*)    APPearance CLEAR (*)    All other components within normal limits  CBC  T4, FREE  CBG MONITORING, ED  TROPONIN I (HIGH SENSITIVITY)  TROPONIN I (HIGH SENSITIVITY)   ____________________________________________   ED ECG REPORT I, Vanessa Ruthven, the attending physician, personally viewed and interpreted this ECG.  EKG is normal sinus rate of 80, no ST elevation, no T wave inversion, normal intervals ____________________________________________  RADIOLOGY Robert Bellow, personally viewed and evaluated these images (plain radiographs) as part of my medical decision making, as well as reviewing the written report by the radiologist.  ED MD interpretation: Pacemaker without any interruptions leads appear intact  Official radiology report(s): Dg Chest 1 View  Result Date: 08/21/2019 CLINICAL DATA:  Pacemaker dysfunction. EXAM: CHEST  1 VIEW COMPARISON:  October 13, 2018 FINDINGS: Pacemaker is stable in appearance. Leads appear to be intact. No pneumothorax. No nodules or masses. No focal infiltrates. No overt edema. The cardiomediastinal silhouette is normal. IMPRESSION: No cause for the patient's symptoms identified. No acute abnormalities. Electronically Signed   By: Dorise Bullion III M.D   On: 08/21/2019 18:50   Ct Head Wo Contrast  Result Date: 08/21/2019 CLINICAL DATA:  53 year old male with vertigo. EXAM: CT HEAD WITHOUT CONTRAST TECHNIQUE: Contiguous axial images were obtained from the base of the skull through the vertex without intravenous contrast. COMPARISON:  None. FINDINGS: Brain: Mild age-related  atrophy and chronic microvascular ischemic changes. There is no acute intracranial hemorrhage. No mass effect or midline shift no extra-axial fluid collection. Vascular: No hyperdense vessel or unexpected calcification. Skull: Normal. Negative for fracture or focal lesion. Sinuses/Orbits: No acute finding. Other: None IMPRESSION: 1. No acute intracranial hemorrhage. 2. Mild age-related atrophy and chronic microvascular ischemic changes. Electronically Signed   By: Anner Crete M.D.   On: 08/21/2019 16:25    ____________________________________________   PROCEDURES  Procedure(s) performed (including Critical Care):  Procedures   ____________________________________________   INITIAL IMPRESSION / ASSESSMENT AND PLAN / ED COURSE  Eric Lawrence was evaluated in Emergency Department on 08/21/2019 for the symptoms described in the history of present illness. He was evaluated in the context of the global COVID-19 pandemic, which necessitated consideration that the patient might be at risk for infection with the SARS-CoV-2 virus that causes COVID-19. Institutional protocols and algorithms that pertain to the evaluation of patients at risk for COVID-19 are in a state of rapid change based on information released by regulatory bodies including the CDC and federal and state organizations. These policies and algorithms were followed during the patient's care in the ED.    Unclear why patient was told that his pacemaker was not working.  Will get x-ray to evaluate lead placement.  Will get EKG, cardiac markers to evaluate for ACS as well as interrogation evaluate for heart block.  I suspect that these low heart rates may just be due to the device that he is using to measure if his interrogation is normal.  Also get labs to evaluate electrolyte, UTI.  CT head was ordered in triage given this report of some tingling sensations bilateral legs although he has a normal neuro exam I have very low suspicion  for stroke at this time.  For the lymph nodes there palpated bilaterally without any evidence of external swelling.  No difficulty swallowing.  This is more likely sling patient to follow-up with his primary care doctor.  Labs are reassuring with normal kidney function although BUN is slightly elevated at 23.  White count is normal therefore lower suspicion for infection.  Hemoglobin is normal.  UA without evidence of UTI.  CT head is negative.  Repeat troponins x2 are negative  Patient's device interrogation showed an 8-second run of V. tach October 15.  Patient is electrolytes are normal with a K of 4.2.  I doubt that this is the cause of patient's dizziness given his had multiple episodes however given the V. tach will discuss with cardiology team.   D/w Dr. Ubaldo Glassing.  Patient to follow-up with clinic tomorrow.  Reevaluated patient is continue to be asymptomatic, no dizziness now. Patient is well-appearing and is remained stable here.  Patient will call cardiology tomorrow for follow-up.   I discussed the provisional nature of ED diagnosis, the treatment so far, the ongoing plan of care, follow up appointments and return precautions with the patient and any family or support people present. They expressed understanding and agreed with the plan, discharged home.    ________________________________________   FINAL CLINICAL IMPRESSION(S) / ED DIAGNOSES   Final diagnoses:  NSVT (nonsustained ventricular tachycardia) (HCC)  Dizziness      MEDICATIONS GIVEN DURING THIS VISIT:  Medications - No data to display   ED Discharge Orders    None       Note:  This document was prepared using Dragon voice recognition software and may include unintentional dictation errors.   Vanessa Lecompte, MD 08/21/19 2057    Vanessa Glide, MD 08/21/19 [phone]

## 2019-08-21 NOTE — ED Triage Notes (Signed)
Pt states that he has been more tired for the past 4-5 days, pt states that he has had some dizziness, numbness to his legs and feet, legs appear red to him, states that his lymph nodes are swollen, states that only one chamber of his pacemaker is working. States was told by the cardiologist. Pt states that his heart rate will randomly drop. Pt states that he has quit smoking for the past 3 days, states that his normal bp 274/117

## 2019-08-21 NOTE — Discharge Instructions (Addendum)
Your work-up was reassuring.  You are interrogation showed a short run of V. tach so he should call cardiology tomorrow say that you are seen in the ER and told to call for follow-up appointment tomorrow.  Return to the ER if develop worsening dizziness, weakness on one side of body or any other concerns.

## 2019-08-24 DIAGNOSIS — R002 Palpitations: Secondary | ICD-10-CM | POA: Diagnosis not present

## 2019-09-12 DIAGNOSIS — I455 Other specified heart block: Secondary | ICD-10-CM | POA: Diagnosis not present

## 2019-09-13 DIAGNOSIS — I4589 Other specified conduction disorders: Secondary | ICD-10-CM | POA: Diagnosis not present

## 2019-09-13 DIAGNOSIS — I455 Other specified heart block: Secondary | ICD-10-CM | POA: Diagnosis not present

## 2019-09-13 DIAGNOSIS — J439 Emphysema, unspecified: Secondary | ICD-10-CM | POA: Diagnosis not present

## 2019-09-13 DIAGNOSIS — I10 Essential (primary) hypertension: Secondary | ICD-10-CM | POA: Diagnosis not present

## 2019-09-13 DIAGNOSIS — G4730 Sleep apnea, unspecified: Secondary | ICD-10-CM | POA: Diagnosis not present

## 2019-09-16 IMAGING — CR DG CHEST 2V
2 series · 2 of 2 positions shown · non-contrast
Comparison: October 09, 2015

CLINICAL DATA: Preoperative prior to pacemaker placement.

EXAM:
CHEST - 2 VIEW

[chest pa]
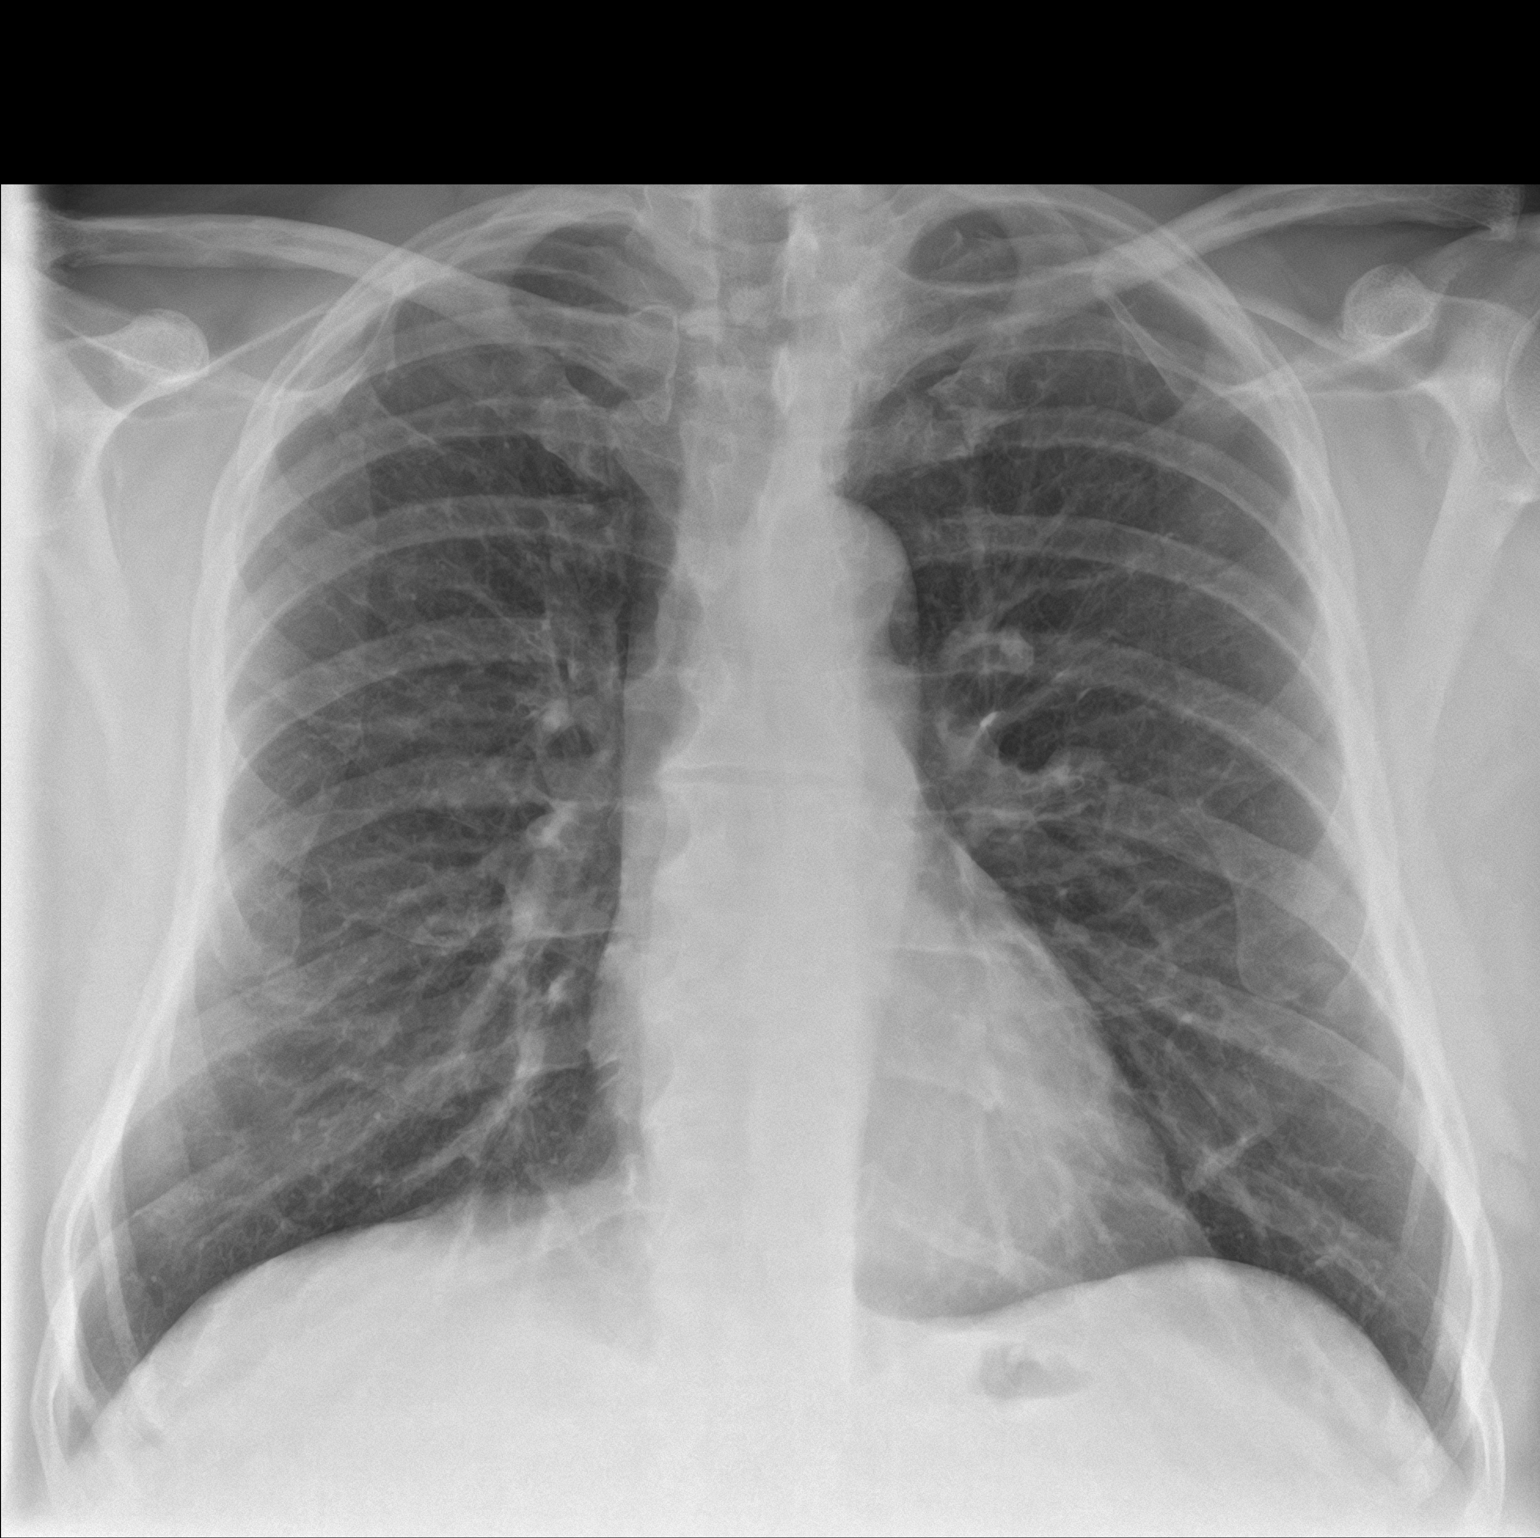

[chest lat]
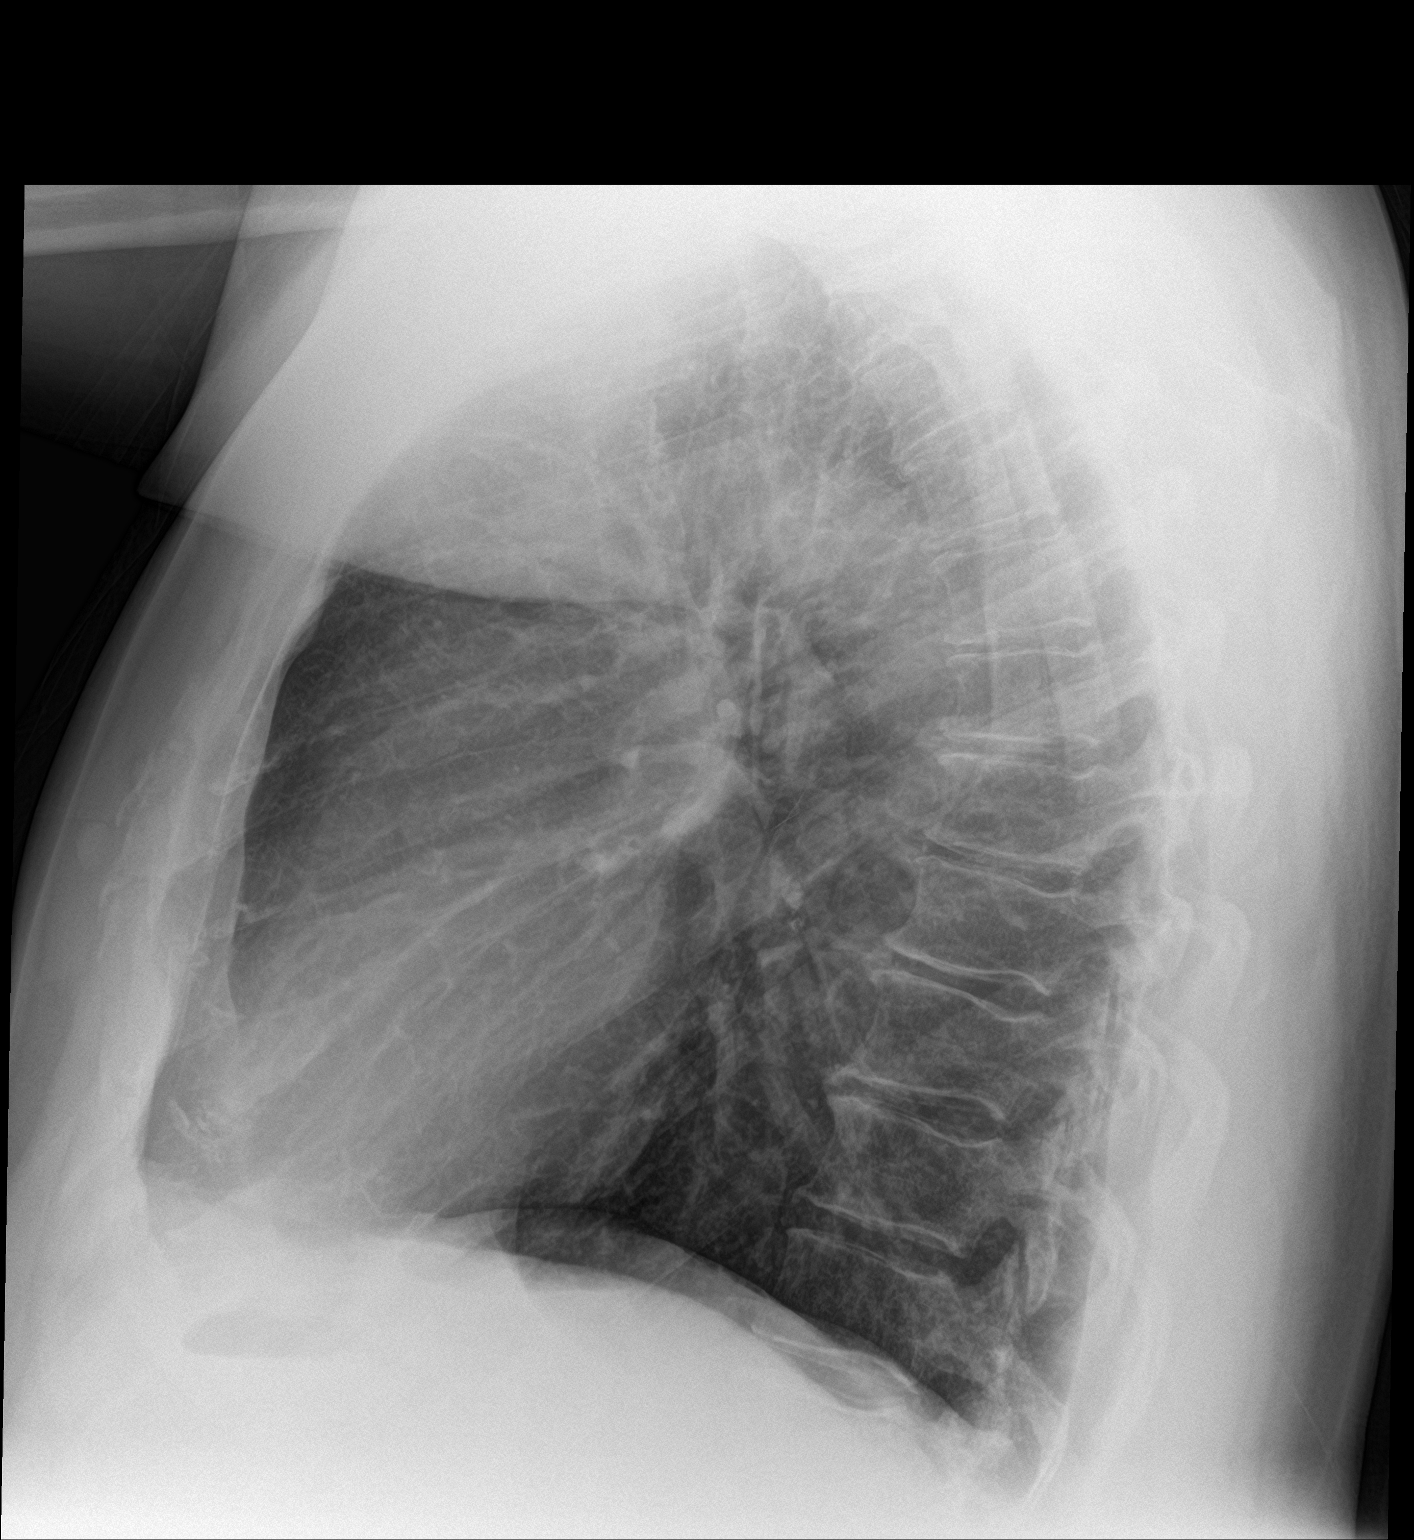

[2 of 2 positions shown; findings below may reference images not displayed]

FINDINGS: The heart size and mediastinal contours are within normal limits.
Both lungs are clear. The visualized skeletal structures are
unremarkable.
IMPRESSION: No active cardiopulmonary disease.

## 2019-09-19 DIAGNOSIS — J449 Chronic obstructive pulmonary disease, unspecified: Secondary | ICD-10-CM | POA: Diagnosis not present

## 2019-09-19 DIAGNOSIS — I720 Aneurysm of carotid artery: Secondary | ICD-10-CM | POA: Diagnosis not present

## 2019-10-21 ENCOUNTER — Ambulatory Visit: Payer: Medicare Other | Admitting: Physician Assistant

## 2019-10-26 ENCOUNTER — Other Ambulatory Visit: Payer: Self-pay

## 2019-10-26 NOTE — Patient Outreach (Signed)
[facility]) Care Management  10/26/2019  PAGE LANCON 08/09/1966 [id number]   Medication Adherence call to [name] [place] Identifiers Verify spoke with patient he is past due on Atorvastatin 10 mg,patient explain he does not take it the way he is supposed to be taking it patient has a prescription ready at [place] to be pick up. Mr. Clink is showing past due under [place].  [place] Management Direct Dial [phone]  Fax [phone] [email or web address]

## 2019-11-07 NOTE — Progress Notes (Signed)
Patient: Eric Lawrence Male    DOB: 1966-10-26   54 y.o.   MRN: [id number] Visit Date: 11/08/2019  Today's Provider: Trinna Post, PA-C   Chief Complaint  Patient presents with  . Diabetes Mellitus   Subjective:     HPI  Diabetes Mellitus Type II, Follow-up:   Lab Results  Component Value Date   HGBA1C 6.4 (A) 11/08/2019   HGBA1C 6.4 (A) 07/22/2019   HGBA1C 6.3 (A) 04/21/2019    Last seen for diabetes 4 months ago.  Management since then includes started on Atorvastatin 10 mg. Currently diet controlled.  He reports good compliance with treatment. He is not having side effects.  Current symptoms include polydipsia and polyuria and have been unchanged. Home blood sugar records: not checking at home, trying to watch sugar intake.  Episodes of hypoglycemia? no   Current insulin regiment: Is not on insulin Most Recent Eye Exam:  Weight trend: increasing steadily Prior visit with dietician: No Current exercise: not asked Current diet habits: well balanced  Pertinent Labs:    Component Value Date/Time   CHOL 129 08/05/2018 1012   TRIG 176 (H) 08/05/2018 1012   HDL 30 (L) 08/05/2018 1012   LDLCALC 64 08/05/2018 1012   LDLCALC 103 (H) 12/07/2017 0823   CREATININE 1.18 08/21/2019 1536   CREATININE 1.06 12/07/2017 0823    Wt Readings from Last 3 Encounters:  11/08/19 263 lb (119.3 kg)  08/21/19 230 lb (104.3 kg)  07/22/19 265 lb 3.2 oz (120.3 kg)   BP Readings from Last 3 Encounters:  11/08/19 (!) 151/102  08/21/19 133/86  07/22/19 (!) 179/117   Tobacco abuse: 1/2 pack per day   Urinary frequency: Occurs mostly at night and he has incontinent episodes. He was examined 04/2019 and prostate exam was benign. Trial of flomax was not helpful. Patient has insomnia and does snore. Using night time oxygen but has not had a sleep study.  HLD: Prescribed Lipitor 10 mg daily. He does not take medications but reports he has been taking them for the past three  days due to insurance calling about his noncompliance.   HTN:  Prescribed amlodipine 10 mg daily, lisinopril 40 mg daily, and maxzide 37.5-25 mg daily. He is not compliant. He reports he has been complaint for the past three days due to insurance calling about his noncompliance. ------------------------------------------------------------------------   Allergies  Allergen Reactions  . Cephalexin Rash    severe     Current Outpatient Medications:  .  acetaminophen (TYLENOL) 325 MG tablet, Take 325 mg by mouth daily. , Disp: , Rfl:  .  amLODipine (NORVASC) 10 MG tablet, Take 1 tablet (10 mg total) by mouth daily., Disp: 90 tablet, Rfl: 0 .  atorvastatin (LIPITOR) 10 MG tablet, Take 1 tablet (10 mg total) by mouth daily., Disp: 90 tablet, Rfl: 3 .  blood glucose meter kit and supplies, Check glucose once a day.  (FOR ICD-10 E10.9, E11.9)., Disp: 1 each, Rfl: 0 .  lisinopril (PRINIVIL,ZESTRIL) 40 MG tablet, Take 1 tablet (40 mg total) by mouth daily., Disp: 90 tablet, Rfl: 3 .  Multiple Vitamin (MULTIVITAMIN WITH MINERALS) TABS tablet, Take 1 tablet by mouth daily., Disp: , Rfl:  .  OXYGEN, Inhale 3 L into the lungs at bedtime. , Disp: , Rfl:  .  triamterene-hydrochlorothiazide (MAXZIDE-25) 37.5-25 MG tablet, Take 1 tablet by mouth daily., Disp: 90 tablet, Rfl: 3 .  albuterol (PROVENTIL HFA;VENTOLIN HFA) 108 (90 Base) MCG/ACT inhaler,  Inhale 2 puffs every 4 (four) hours as needed into the lungs for wheezing or shortness of breath. (Patient not taking: Reported on 11/08/2019), Disp: 1 Inhaler, Rfl: 3 .  buPROPion (WELLBUTRIN XL) 300 MG 24 hr tablet, Take 300 mg by mouth 2 (two) times daily. , Disp: , Rfl:  .  Fluticasone-Salmeterol (ADVAIR DISKUS) 250-50 MCG/DOSE AEPB, Inhale 1 puff into the lungs 2 (two) times daily. (Patient not taking: Reported on 11/08/2019), Disp: 1 each, Rfl: 3 .  ibuprofen (ADVIL,MOTRIN) 600 MG tablet, Take 1 tablet (600 mg total) by mouth every 8 (eight) hours as needed for  mild pain or moderate pain. (Patient not taking: Reported on 11/08/2019), Disp: 30 tablet, Rfl: 0 .  nicotine (NICODERM CQ - DOSED IN MG/24 HOURS) 21 mg/24hr patch, Place 21 mg onto the skin daily. , Disp: , Rfl: 3  Review of Systems  Constitutional: Negative.   Respiratory: Negative.   Cardiovascular: Negative.   Neurological: Negative.     Social History   Tobacco Use  . Smoking status: Former Smoker    Packs/day: 0.25    Types: Cigarettes  . Smokeless tobacco: Never Used  Substance Use Topics  . Alcohol use: Not Currently    Alcohol/week: 0.0 standard drinks      Objective:   BP (!) 151/102 (BP Location: Right Arm, Patient Position: Sitting, Cuff Size: Large)   Pulse 99   Temp (!) 97.1 F (36.2 C) (Temporal)   Wt 263 lb (119.3 kg)   BMI 33.77 kg/m  Vitals:   11/08/19 0930  BP: (!) 151/102  Pulse: 99  Temp: (!) 97.1 F (36.2 C)  TempSrc: Temporal  Weight: 263 lb (119.3 kg)  Body mass index is 33.77 kg/m.   Physical Exam Constitutional:      Appearance: Normal appearance.  Cardiovascular:     Rate and Rhythm: Normal rate and regular rhythm.     Heart sounds: Normal heart sounds.  Pulmonary:     Effort: Pulmonary effort is normal.     Breath sounds: Normal breath sounds.  Skin:    General: Skin is warm and dry.  Neurological:     Mental Status: He is alert and oriented to person, place, and time. Mental status is at baseline.  Psychiatric:        Mood and Affect: Mood normal.        Behavior: Behavior normal.      Results for orders placed or performed in visit on 11/08/19  POCT HgB A1C  Result Value Ref Range   Hemoglobin A1C 6.4 (A) 4.0 - 5.6 %   Estimated Average Glucose 137        Assessment & Plan    1. Diabetes mellitus without complication ([place])  Controlled. Referred for eye exam as he no showed his last appointment. Stressed the importance of this and to write down his appointment.  - POCT HgB A1C - Ambulatory referral to  Ophthalmology  2. Simple chronic bronchitis (HCC)  Doubtfully compliant with maintenance inhaler.  3. Hyperlipidemia, unspecified hyperlipidemia type  Reports compliance x 3 days.  4. Essential hypertension  Reports compliance x 3 days. Historically noncompliant.  5. Suspected sleep apnea  Advised I think his night time urinary symptoms are due to sleep apnea but could have BPH component. Refer to urology and pursue sleep study.  - Home sleep test  6. Hyperlipidemia associated with type 2 diabetes mellitus ([place])   7. Recurrent major depressive disorder, in remission ([place])   8. NSVT (  nonsustained ventricular tachycardia) (HCC)  The entirety of the information documented in the History of Present Illness, Review of Systems and Physical Exam were personally obtained by me. Portions of this information were initially documented by Atiya Cumminds, CMA and reviewed by me for thoroughness and accuracy.   F/u 3 months DM II, HTN, HLD        Trinna Post, PA-C  [place]

## 2019-11-08 ENCOUNTER — Ambulatory Visit (INDEPENDENT_AMBULATORY_CARE_PROVIDER_SITE_OTHER): Payer: Medicare Other | Admitting: Physician Assistant

## 2019-11-08 ENCOUNTER — Other Ambulatory Visit: Payer: Self-pay

## 2019-11-08 ENCOUNTER — Encounter: Payer: Self-pay | Admitting: Physician Assistant

## 2019-11-08 VITALS — BP 151/102 | HR 99 | Temp 97.1°F | Wt 263.0 lb

## 2019-11-08 DIAGNOSIS — F3340 Major depressive disorder, recurrent, in remission, unspecified: Secondary | ICD-10-CM

## 2019-11-08 DIAGNOSIS — J410 Simple chronic bronchitis: Secondary | ICD-10-CM | POA: Diagnosis not present

## 2019-11-08 DIAGNOSIS — I4729 Other ventricular tachycardia: Secondary | ICD-10-CM

## 2019-11-08 DIAGNOSIS — E1169 Type 2 diabetes mellitus with other specified complication: Secondary | ICD-10-CM | POA: Insufficient documentation

## 2019-11-08 DIAGNOSIS — E119 Type 2 diabetes mellitus without complications: Secondary | ICD-10-CM | POA: Diagnosis not present

## 2019-11-08 DIAGNOSIS — I472 Ventricular tachycardia: Secondary | ICD-10-CM

## 2019-11-08 DIAGNOSIS — E785 Hyperlipidemia, unspecified: Secondary | ICD-10-CM

## 2019-11-08 DIAGNOSIS — R350 Frequency of micturition: Secondary | ICD-10-CM

## 2019-11-08 DIAGNOSIS — I10 Essential (primary) hypertension: Secondary | ICD-10-CM

## 2019-11-08 DIAGNOSIS — R29818 Other symptoms and signs involving the nervous system: Secondary | ICD-10-CM

## 2019-11-08 LAB — POCT GLYCOSYLATED HEMOGLOBIN (HGB A1C)
Estimated Average Glucose: 137
Hemoglobin A1C: 6.4 % — AB (ref 4.0–5.6)

## 2019-11-08 NOTE — Patient Instructions (Signed)
PLEASE GET YOUR EYE EXAM AT [facility] ONCE YEARLY FOR DIABETES

## 2019-11-10 ENCOUNTER — Other Ambulatory Visit: Payer: Self-pay

## 2019-11-10 NOTE — Patient Outreach (Signed)
[facility]) Care Management  11/10/2019  IRENE MITCHAM 1966-07-23 [id number]   Medication Adherence call to Madelyn Brunner HIPPA Compliant Voice message left with a call back number. Mr. Iser is showing past due on Atorvastatin 10 mg under [employer]. [facility] said last pick up was on 07/2019   [facility] CPhT Pharmacy Technician [facility] Management Direct Dial [phone]  Fax [phone] [email or web address]

## 2019-11-11 ENCOUNTER — Telehealth: Payer: Self-pay | Admitting: Physician Assistant

## 2019-11-11 NOTE — Telephone Encounter (Signed)
[facility]  Appointment for pt is: 01-11-20 at 8:40 am with Dr. Nelda Marseille, [facility]

## 2019-11-15 LAB — PULMONARY FUNCTION TEST

## 2019-11-29 ENCOUNTER — Telehealth: Payer: Self-pay

## 2019-11-29 ENCOUNTER — Telehealth: Payer: Self-pay | Admitting: Physician Assistant

## 2019-11-29 NOTE — Telephone Encounter (Signed)
Copied from CRM [id number]. Topic: General - Other >> Nov 29, 2019  9:59 AM Dalphine Handing A wrote: Patient would like a callback from nurse to go over sleep study results. Please advise

## 2019-11-29 NOTE — Telephone Encounter (Signed)
Patient was advised and states that he will be waiting for someone to contact him about his CPAP machine.

## 2019-11-29 NOTE — Telephone Encounter (Signed)
Can we please call patient and let him know his sleep study showed moderate sleep apnea? I have signed forms for CPAP machine and we can fax them. Thanks

## 2019-11-29 NOTE — Telephone Encounter (Signed)
Patient was advised via another message. 

## 2019-12-09 ENCOUNTER — Other Ambulatory Visit: Payer: Self-pay

## 2019-12-09 ENCOUNTER — Encounter: Payer: Self-pay | Admitting: Urology

## 2019-12-09 ENCOUNTER — Ambulatory Visit (INDEPENDENT_AMBULATORY_CARE_PROVIDER_SITE_OTHER): Payer: Medicare Other | Admitting: Urology

## 2019-12-09 VITALS — BP 180/100 | HR 88 | Ht 74.0 in | Wt 262.8 lb

## 2019-12-09 DIAGNOSIS — R350 Frequency of micturition: Secondary | ICD-10-CM

## 2019-12-09 LAB — URINALYSIS, COMPLETE
Bilirubin, UA: NEGATIVE
Glucose, UA: NEGATIVE
Ketones, UA: NEGATIVE
Leukocytes,UA: NEGATIVE
Nitrite, UA: NEGATIVE
Specific Gravity, UA: >1.03 — ABNORMAL HIGH (ref 1.005–1.030)
Urobilinogen, Ur: 0.2 mg/dL (ref 0.2–1.0)
pH, UA: 5.5 (ref 5.0–7.5)

## 2019-12-09 LAB — MICROSCOPIC EXAMINATION
Bacteria, UA: NONE SEEN
WBC, UA: NONE SEEN /hpf (ref 0–5)

## 2019-12-09 MED ORDER — OXYBUTYNIN CHLORIDE 5 MG PO TABS: 5.0000 mg | ORAL_TABLET | Freq: Two times a day (BID) | ORAL | 3 refills | Status: DC

## 2019-12-09 NOTE — Patient Instructions (Signed)
Cystoscopy Cystoscopy is a procedure that is used to help diagnose and sometimes treat conditions that affect the lower urinary tract. The lower urinary tract includes the bladder and the urethra. The urethra is the tube that drains urine from the bladder. Cystoscopy is done using a thin, tube-shaped instrument with a light and camera at the end (cystoscope). The cystoscope may be hard or flexible, depending on the goal of the procedure. The cystoscope is inserted through the urethra, into the bladder. Cystoscopy may be recommended if you have:  Urinary tract infections that keep coming back.  Blood in the urine (hematuria).  An inability to control when you urinate (urinary incontinence) or an overactive bladder.  Unusual cells found in a urine sample.  A blockage in the urethra, such as a urinary stone.  Painful urination.  An abnormality in the bladder found during an intravenous pyelogram (IVP) or CT scan. Cystoscopy may also be done to remove a sample of tissue to be examined under a microscope (biopsy). Tell a health care provider about:  Any allergies you have.  All medicines you are taking, including vitamins, herbs, eye drops, creams, and over-the-counter medicines.  Any problems you or family members have had with anesthetic medicines.  Any blood disorders you have.  Any surgeries you have had.  Any medical conditions you have.  Whether you are pregnant or may be pregnant. What are the risks? Generally, this is a safe procedure. However, problems may occur, including:  Infection.  Bleeding.  Allergic reactions to medicines.  Damage to other structures or organs. What happens before the procedure?  Ask your health care provider about: ? Changing or stopping your regular medicines. This is especially important if you are taking diabetes medicines or blood thinners. ? Taking medicines such as aspirin and ibuprofen. These medicines can thin your blood. Do not take  these medicines unless your health care provider tells you to take them. ? Taking over-the-counter medicines, vitamins, herbs, and supplements.  Follow instructions from your health care provider about eating or drinking restrictions.  Ask your health care provider what steps will be taken to help prevent infection. These may include: ? Washing skin with a germ-killing soap. ? Taking antibiotic medicine.  You may have an exam or testing, such as: ? X-rays of the bladder, urethra, or kidneys. ? Urine tests to check for signs of infection.  Plan to have someone take you home from the hospital or clinic. What happens during the procedure?   You will be given one or more of the following: ? A medicine to help you relax (sedative). ? A medicine to numb the area (local anesthetic).  The area around the opening of your urethra will be cleaned.  The cystoscope will be passed through your urethra into your bladder.  Germ-free (sterile) fluid will flow through the cystoscope to fill your bladder. The fluid will stretch your bladder so that your health care provider can clearly examine your bladder walls.  Your doctor will look at the urethra and bladder. Your doctor may take a biopsy or remove stones.  The cystoscope will be removed, and your bladder will be emptied. The procedure may vary among health care providers and hospitals. What can I expect after the procedure? After the procedure, it is common to have:  Some soreness or pain in your abdomen and urethra.  Urinary symptoms. These include: ? Mild pain or burning when you urinate. Pain should stop within a few minutes after you urinate. This   may last for up to 1 week. ? A small amount of blood in your urine for several days. ? Feeling like you need to urinate but producing only a small amount of urine. Follow these instructions at home: Medicines  Take over-the-counter and prescription medicines only as told by your health care  provider.  If you were prescribed an antibiotic medicine, take it as told by your health care provider. Do not stop taking the antibiotic even if you start to feel better. General instructions  Return to your normal activities as told by your health care provider. Ask your health care provider what activities are safe for you.  Do not drive for 24 hours if you were given a sedative during your procedure.  Watch for any blood in your urine. If the amount of blood in your urine increases, call your health care provider.  Follow instructions from your health care provider about eating or drinking restrictions.  If a tissue sample was removed for testing (biopsy) during your procedure, it is up to you to get your test results. Ask your health care provider, or the department that is doing the test, when your results will be ready.  Drink enough fluid to keep your urine pale yellow.  Keep all follow-up visits as told by your health care provider. This is important. Contact a health care provider if you:  Have pain that gets worse or does not get better with medicine, especially pain when you urinate.  Have trouble urinating.  Have more blood in your urine. Get help right away if you:  Have blood clots in your urine.  Have abdominal pain.  Have a fever or chills.  Are unable to urinate. Summary  Cystoscopy is a procedure that is used to help diagnose and sometimes treat conditions that affect the lower urinary tract.  Cystoscopy is done using a thin, tube-shaped instrument with a light and camera at the end.  After the procedure, it is common to have some soreness or pain in your abdomen and urethra.  Watch for any blood in your urine. If the amount of blood in your urine increases, call your health care provider.  If you were prescribed an antibiotic medicine, take it as told by your health care provider. Do not stop taking the antibiotic even if you start to feel better. This  information is not intended to replace advice given to you by your health care provider. Make sure you discuss any questions you have with your health care provider. Document Revised: 10/12/2018 Document Reviewed: 10/12/2018 Elsevier Patient Education  2020 [employer] Overactive Bladder, Adult  Overactive bladder refers to a condition in which a person has a sudden need to pass urine. The person may leak urine if he or she cannot get to the bathroom fast enough (urinary incontinence). A person with this condition may also wake up several times in the night to go to the bathroom. Overactive bladder is associated with poor nerve signals between your bladder and your brain. Your bladder may get the signal to empty before it is full. You may also have very sensitive muscles that make your bladder squeeze too soon. These symptoms might interfere with daily work or social activities. What are the causes? This condition may be associated with or caused by:  Urinary tract infection.  Infection of nearby tissues, such as the prostate.  Prostate enlargement.  Surgery on the uterus or urethra.  Bladder stones, inflammation, or tumors.  Drinking too much caffeine   or alcohol.  Certain medicines, especially medicines that get rid of extra fluid in the body (diuretics).  Muscle or nerve weakness, especially from: ? A spinal cord injury. ? Stroke. ? Multiple sclerosis. ? Parkinson's disease.  Diabetes.  Constipation. What increases the risk? You may be at greater risk for overactive bladder if you:  Are an older adult.  Smoke.  Are going through menopause.  Have prostate problems.  Have a neurological disease, such as stroke, dementia, Parkinson's disease, or multiple sclerosis (MS).  Eat or drink things that irritate the bladder. These include alcohol, spicy food, and caffeine.  Are overweight or obese. What are the signs or symptoms? Symptoms of this condition  include:  Sudden, strong urge to urinate.  Leaking urine.  Urinating 8 or more times a day.  Waking up to urinate 2 or more times a night. How is this diagnosed? Your health care provider may suspect overactive bladder based on your symptoms. He or she will diagnose this condition by:  A physical exam and medical history.  Blood or urine tests. You might need bladder or urine tests to help determine what is causing your overactive bladder. You might also need to see a health care provider who specializes in urinary tract problems (urologist). How is this treated? Treatment for overactive bladder depends on the cause of your condition and whether it is mild or severe. You can also make lifestyle changes at home. Options include:  Bladder training. This may include: ? Learning to control the urge to urinate by following a schedule that directs you to urinate at regular intervals (timed voiding). ? Doing Kegel exercises to strengthen your pelvic floor muscles, which support your bladder. Toning these muscles can help you control urination, even if your bladder muscles are overactive.  Special devices. This may include: ? Biofeedback, which uses sensors to help you become aware of your body's signals. ? Electrical stimulation, which uses electrodes placed inside the body (implanted) or outside the body. These electrodes send gentle pulses of electricity to strengthen the nerves or muscles that control the bladder. ? Women may use a plastic device that fits into the vagina and supports the bladder (pessary).  Medicines. ? Antibiotics to treat bladder infection. ? Antispasmodics to stop the bladder from releasing urine at the wrong time. ? Tricyclic antidepressants to relax bladder muscles. ? Injections of botulinum toxin type A directly into the bladder tissue to relax bladder muscles.  Lifestyle changes. This may include: ? Weight loss. Talk to your health care provider about weight  loss methods that would work best for you. ? Diet changes. This may include reducing how much alcohol and caffeine you consume, or drinking fluids at different times of the day. ? Not smoking. Do not use any products that contain nicotine or tobacco, such as cigarettes and e-cigarettes. If you need help quitting, ask your health care provider.  Surgery. ? A device may be implanted to help manage the nerve signals that control urination. ? An electrode may be implanted to stimulate electrical signals in the bladder. ? A procedure may be done to change the shape of the bladder. This is done only in very severe cases. Follow these instructions at home: Lifestyle  Make any diet or lifestyle changes that are recommended by your health care provider. These may include: ? Drinking less fluid or drinking fluids at different times of the day. ? Cutting down on caffeine or alcohol. ? Doing Kegel exercises. ? Losing weight if needed. ?   Eating a healthy and balanced diet to prevent constipation. This may include:  Eating foods that are high in fiber, such as fresh fruits and vegetables, whole grains, and beans.  Limiting foods that are high in fat and processed sugars, such as fried and sweet foods. General instructions  Take over-the-counter and prescription medicines only as told by your health care provider.  If you were prescribed an antibiotic medicine, take it as told by your health care provider. Do not stop taking the antibiotic even if you start to feel better.  Use any implants or pessary as told by your health care provider.  If needed, wear pads to absorb urine leakage.  Keep a journal or log to track how much and when you drink and when you feel the need to urinate. This will help your health care provider monitor your condition.  Keep all follow-up visits as told by your health care provider. This is important. Contact a health care provider if:  You have a fever.  Your  symptoms do not get better with treatment.  Your pain and discomfort get worse.  You have more frequent urges to urinate. Get help right away if:  You are not able to control your bladder. Summary  Overactive bladder refers to a condition in which a person has a sudden need to pass urine.  Several conditions may lead to an overactive bladder.  Treatment for overactive bladder depends on the cause and severity of your condition.  Follow your health care provider's instructions about lifestyle changes, doing Kegel exercises, keeping a journal, and taking medicines. This information is not intended to replace advice given to you by your health care provider. Make sure you discuss any questions you have with your health care provider. Document Revised: 02/10/2019 Document Reviewed: 11/05/2017 Elsevier Patient Education  2020 Elsevier Inc.  

## 2019-12-09 NOTE — Progress Notes (Signed)
12/09/2019 9:14 AM   [name] 1966-09-23 [id number]  Referring provider: Trinna Post, PA-C [address] [place],  [place] [address]  Chief Complaint  Patient presents with  . Urinary Frequency    HPI:  Eric Lawrence is a 54 yo male referred for frequency, nocturia and enuresis. He voids with a good flow. He has urgency and UUI. No daytime pads. Wears depends at night but they are dry. Might have a enuresis once per week. No NG risk. No gross hematuria. Going on for 5 years. No constipation. He will start a CPAP soon.   PVR 12 ml today. PSA was 6/20 0.6. CT 10/19 revealed a normal GU tract and prostate.   PMH: Past Medical History:  Diagnosis Date  . Acute cholecystitis 06/29/2017  . ADHD   . 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 Airway hyperreactivity 04/19/2015  . Anxiety   . Anxiety disorder 04/19/2015  . Arthritis 04/19/2015  . At risk for injury 04/19/2015  . CAFL (chronic airflow limitation) ([place]) 04/19/2015  . Cholecystitis 06/29/2017  . Chronic gastritis 04/19/2015  . Compulsive tobacco user syndrome 04/19/2015  . COPD (chronic obstructive pulmonary disease) ([place])   . Depression   . Dysrhythmia   . Encounter for immunization 04/19/2015  . GERD (gastroesophageal reflux disease)   . H/O elevated lipids 04/19/2015  . Hypertension   . Mechanical and motor problems with internal organs 04/19/2015  . Obesity 09/10/2015  . Oxygen deficiency   . Sleep apnea   . Trigger thumb of left hand 04/15/2017    Surgical History: Past Surgical History:  Procedure Laterality Date  . CHOLECYSTECTOMY N/A 08/06/2017   Procedure: LAPAROSCOPIC CHOLECYSTECTOMY;  Surgeon: Florene Glen, MD;  Location: [facility] ORS;  Service: General;  Laterality: N/A;  . cyst removal from wrist    . INCISIONAL HERNIA REPAIR N/A 08/30/2018   Procedure: HERNIA REPAIR INCISIONAL;  Surgeon: Olean Ree, MD;  Location: [facility] ORS;  Service: General;  Laterality: N/A;  . PACEMAKER INSERTION Left  10/13/2018   Procedure: INSERTION PACEMAKER;  Surgeon: Isaias Cowman, MD;  Location: [facility] ORS;  Service: Cardiovascular;  Laterality: Left;  . UPPER GASTROINTESTINAL ENDOSCOPY      Home Medications:  Allergies as of 12/09/2019      Reactions   Cephalexin Rash   severe      Medication List       Accurate as of December 09, 2019  9:14 AM. If you have any questions, ask your nurse or doctor.        STOP taking these medications   albuterol 108 (90 Base) MCG/ACT inhaler Commonly known as: VENTOLIN HFA Stopped by: Festus Aloe, MD     TAKE these medications   amLODipine 10 MG tablet Commonly known as: NORVASC Take 1 tablet (10 mg total) by mouth daily.   aspirin 81 MG EC tablet Take by mouth.   atorvastatin 10 MG tablet Commonly known as: LIPITOR Take 1 tablet (10 mg total) by mouth daily.   blood glucose meter kit and supplies Check glucose once a day.  (FOR ICD-10 E10.9, E11.9).   buPROPion 300 MG 24 hr tablet Commonly known as: WELLBUTRIN XL Take 300 mg by mouth 2 (two) times daily.   Fluticasone-Salmeterol 250-50 MCG/DOSE Aepb Commonly known as: Advair Diskus Inhale 1 puff into the lungs 2 (two) times daily.   ibuprofen 600 MG tablet Commonly known as: ADVIL Take 1 tablet (600 mg total) by mouth every 8 (eight) hours as needed for  mild pain or moderate pain.   lisinopril 40 MG tablet Commonly known as: ZESTRIL Take 1 tablet (40 mg total) by mouth daily.   multivitamin with minerals Tabs tablet Take 1 tablet by mouth daily.   nicotine 21 mg/24hr patch Commonly known as: NICODERM CQ - dosed in mg/24 hours Place 21 mg onto the skin daily.   OXYGEN Inhale 3 L into the lungs at bedtime.   triamterene-hydrochlorothiazide 37.5-25 MG tablet Commonly known as: MAXZIDE-25 Take 1 tablet by mouth daily.   Tylenol 325 MG tablet Generic drug: acetaminophen Take 325 mg by mouth daily.       Allergies:  Allergies  Allergen Reactions  . Cephalexin  Rash    severe    Family History: Family History  Problem Relation Age of Onset  . Stroke Mother 45  . Diabetes Mother   . Kidney disease Mother   . Hypertension Mother   . COPD Maternal Aunt   . Diabetes Maternal Grandmother   . Heart disease Sister     Social History:  reports that he has quit smoking. His smoking use included cigarettes. He smoked 0.25 packs per day. He has never used smokeless tobacco. He reports previous alcohol use. He reports that he does not use drugs.  ROS: UROLOGY Frequent Urination?: Yes Hard to postpone urination?: Yes Burning/pain with urination?: No Get up at night to urinate?: Yes Leakage of urine?: Yes Urine stream starts and stops?: No Trouble starting stream?: No Do you have to strain to urinate?: No Blood in urine?: No Urinary tract infection?: No Sexually transmitted disease?: No Injury to kidneys or bladder?: No Painful intercourse?: No Weak stream?: No Erection problems?: Yes Penile pain?: No  Gastrointestinal Nausea?: No Vomiting?: No Indigestion/heartburn?: No Diarrhea?: No Constipation?: No  Constitutional Fever: No Night sweats?: No Weight loss?: No Fatigue?: Yes  Skin Skin rash/lesions?: No Itching?: No  Eyes Blurred vision?: Yes Double vision?: No  Ears/Nose/Throat Sore throat?: No Sinus problems?: No  Hematologic/Lymphatic Swollen glands?: No Easy bruising?: No  Cardiovascular Leg swelling?: No Chest pain?: No  Respiratory Cough?: Yes Shortness of breath?: Yes  Endocrine Excessive thirst?: No  Musculoskeletal Back pain?: No Joint pain?: Yes  Neurological Headaches?: No Dizziness?: No  Psychologic Depression?: Yes Anxiety?: Yes  Physical Exam: BP (!) 180/100 (BP Location: Left Arm, Patient Position: Sitting, Cuff Size: Large) Comment: No BP meds  Pulse 88   Ht '6\' 2"'$  (1.88 m)   Wt 262 lb 12.8 oz (119.2 kg)   BMI 33.74 kg/m   Constitutional:  Alert and oriented, No acute  distress. HEENT: [place] AT, moist mucus membranes.  Trachea midline, no masses. Cardiovascular: No clubbing, cyanosis, or edema. Respiratory: Normal respiratory effort, no increased work of breathing. GI: Abdomen is soft, nontender, nondistended, no abdominal masses GU: No CVA tenderness GU: Penis circumcised, normal foreskin, testicles descended bilaterally and palpably normal, bilateral epididymis palpably normal, scrotum normal DRE: Prostate 25 g, smooth without hard area or nodule Lymph: No cervical or inguinal lymphadenopathy. Skin: No rashes, bruises or suspicious lesions. Neurologic: Grossly intact, no focal deficits, moving all 4 extremities. Psychiatric: Normal mood and affect.  Laboratory Data: Lab Results  Component Value Date   WBC 8.8 08/21/2019   HGB 16.5 08/21/2019   HCT 47.2 08/21/2019   MCV 88.9 08/21/2019   PLT 176 08/21/2019    Lab Results  Component Value Date   CREATININE 1.18 08/21/2019    No results found for: PSA  No results found for: TESTOSTERONE  Lab  Results  Component Value Date   HGBA1C 6.4 (A) 11/08/2019    Urinalysis    Component Value Date/Time   COLORURINE YELLOW (A) 08/21/2019 1536   APPEARANCEUR CLEAR (A) 08/21/2019 1536   APPEARANCEUR Hazy 09/08/2012 2200   LABSPEC 1.015 08/21/2019 1536   LABSPEC 1.026 09/08/2012 2200   PHURINE 7.0 08/21/2019 1536   GLUCOSEU NEGATIVE 08/21/2019 1536   GLUCOSEU Negative 09/08/2012 2200   HGBUR NEGATIVE 08/21/2019 [place] 08/21/2019 1536   BILIRUBINUR Negative 09/08/2012 2200   KETONESUR NEGATIVE 08/21/2019 1536   PROTEINUR NEGATIVE 08/21/2019 1536   NITRITE NEGATIVE 08/21/2019 1536   LEUKOCYTESUR NEGATIVE 08/21/2019 1536   LEUKOCYTESUR Negative 09/08/2012 2200    Lab Results  Component Value Date   BACTERIA NONE SEEN 08/21/2019    Pertinent Imaging: CT scan images reviewed  No results found for this or any previous visit. No results found for this or any previous  visit. No results found for this or any previous visit. No results found for this or any previous visit. No results found for this or any previous visit. No results found for this or any previous visit. No results found for this or any previous visit. No results found for this or any previous visit.  Assessment & Plan:    1. Urinary frequency Discussed PT or meds. I sent a low dose of oxybutynin 5 mg BID. He will return for cystoscopy.  - Urinalysis, Complete   No follow-ups on file.  Festus Aloe, MD  [facility] [address] [place], [place] [address] [phone]

## 2020-01-03 ENCOUNTER — Telehealth: Payer: Self-pay | Admitting: Physician Assistant

## 2020-01-03 NOTE — Telephone Encounter (Signed)
Relation to pt: self  Call back number: [phone]  Reason for call:  Patient would like to know if he should contact [employer] to pick up he's oxygen tank due to patient using c pap machine at night, patient unsure if PCP advised to keep both. Patient seeking a follow up call from the nurse

## 2020-01-04 NOTE — Telephone Encounter (Signed)
Patient was advised and states that he will continue with the oxygen and CPAP.FYI

## 2020-01-04 NOTE — Telephone Encounter (Signed)
So he can technically use both, there may be an attachment to his mask that the company can help him set up. His pulse ox study was done several years ago before he got his CPAP. His low oxygen may have been due to his airway being blocked and now that he has a CPAP this may be resolved. The only way to truly know would be to repeat a pulse ox study while wearing the CPAP.

## 2020-01-11 LAB — HM DIABETES EYE EXAM

## 2020-01-17 ENCOUNTER — Encounter: Payer: Self-pay | Admitting: Physician Assistant

## 2020-01-20 ENCOUNTER — Ambulatory Visit (INDEPENDENT_AMBULATORY_CARE_PROVIDER_SITE_OTHER): Payer: Medicare Other | Admitting: Urology

## 2020-01-20 ENCOUNTER — Encounter: Payer: Self-pay | Admitting: Urology

## 2020-01-20 ENCOUNTER — Other Ambulatory Visit: Payer: Self-pay

## 2020-01-20 VITALS — BP 199/127 | HR 80 | Ht 74.0 in | Wt 262.0 lb

## 2020-01-20 DIAGNOSIS — R350 Frequency of micturition: Secondary | ICD-10-CM | POA: Diagnosis not present

## 2020-01-20 LAB — URINALYSIS, COMPLETE
Bilirubin, UA: NEGATIVE
Glucose, UA: NEGATIVE
Ketones, UA: NEGATIVE
Leukocytes,UA: NEGATIVE
Nitrite, UA: NEGATIVE
Specific Gravity, UA: 1.025 (ref 1.005–1.030)
Urobilinogen, Ur: 0.2 mg/dL (ref 0.2–1.0)
pH, UA: 6.5 (ref 5.0–7.5)

## 2020-01-20 LAB — MICROSCOPIC EXAMINATION: Bacteria, UA: NONE SEEN

## 2020-01-20 MED ORDER — LIDOCAINE HCL URETHRAL/MUCOSAL 2 % EX GEL
1.0000 "application " | Freq: Once | CUTANEOUS | Status: AC
  Administered 2020-01-20: 1 via URETHRAL

## 2020-01-20 MED ORDER — OXYBUTYNIN CHLORIDE 5 MG PO TABS: 5.0000 mg | ORAL_TABLET | Freq: Every day | ORAL | 3 refills | Status: DC

## 2020-01-20 NOTE — Progress Notes (Signed)
   01/20/20  CC: No chief complaint on file.   HPI:  Eric Lawrence was seen for frequency, nocturia and enuresis Feb 2021. He voids with a good flow. He has urgency and UUI. No daytime pads. Wears depends at night but they are dry. Might have a enuresis once per week. No NG risk. Going on for 5 years. No constipation. He will start a CPAP soon.   PVR 12 ml today. PSA was 06/20 0.6. CT 10/19 revealed a normal GU tract and prostate.   He was rx'd oxybutynin 5 mg BID, but did not start it.    There were no vitals taken for this visit. NED. A&Ox3.   No respiratory distress   Abd soft, NT, ND Normal phallus with bilateral descended testicles  Cystoscopy Procedure Note  Patient identification was confirmed, informed consent was obtained, and patient was prepped using Betadine solution.  Lidocaine jelly was administered per urethral meatus.    Assistant: Edson Snowball   Pre-Procedure: - Inspection reveals a normal caliber ureteral meatus.  Procedure: The flexible cystoscope was introduced without difficulty - No urethral strictures/lesions are present. - prostate short and mild obstruction from bladder neck  - tight bladder neck - Bilateral ureteral orifices identified - Bladder mucosa  reveals no ulcers, tumors, or lesions - No bladder stones - No trabeculation -he only held 150 ml and had strong urge to void   Retroflexion shows normal bladder neck.    Post-Procedure: - Patient tolerated the procedure well  Assessment/ Plan:  Enuresis - likely on OAB component and possible BNO component.  He did not start oxybutynin twice daily.  He did not pick up the medicine.  We again discussed the nature risk and benefits of anticholinergics and I think to simplify the regimen I will start him on oxybutynin 5 mg nightly.  We could titrate that up and then possibly try an alpha-blocker in the future.   No follow-ups on file.  Jerilee Field, MD

## 2020-01-20 NOTE — Patient Instructions (Signed)

## 2020-04-17 NOTE — Progress Notes (Signed)
Subjective:   Eric Lawrence is a 54 y.o. male who presents for an Initial Medicare Annual Wellness Visit.  I connected with Eric Lawrence today by telephone and verified that I am speaking with the correct person using two identifiers. Location patient: home Location provider: work Persons participating in the virtual visit: patient, provider.   I discussed the limitations, risks, security and privacy concerns of performing an evaluation and management service by telephone and the availability of in person appointments. I also discussed with the patient that there may be a patient responsible charge related to this service. The patient expressed understanding and verbally consented to this telephonic visit.    Interactive audio and video telecommunications were attempted between this provider and patient, however failed, due to patient having technical difficulties OR patient did not have access to video capability.  We continued and completed visit with audio only.  Review of Systems  N/A       Objective:    There were no vitals filed for this visit. There is no height or weight on file to calculate BMI.  Advanced Directives 04/18/2020 08/21/2019 10/13/2018 10/13/2018 10/07/2018 08/30/2018 08/30/2018  Does Patient Have a Medical Advance Directive? Yes No;Yes _0   Type of Paramedic of [place];Living will [place]  Does patient want to make changes to medical advance directive? - - - - - - -  Copy of [place] in Chart? No - copy requested - - - - - -  Would patient like information on creating a medical advance directive? - No - Patient declined No - Patient declined No - Patient declined - No - Patient declined No - Patient declined    Current Medications (verified) Outpatient Encounter Medications as of 04/18/2020  Medication Sig   acetaminophen (TYLENOL) 500 MG tablet Take 500 mg by mouth  every 6 (six) hours as needed.   blood glucose meter kit and supplies Check glucose once a day.  (FOR ICD-10 E10.9, E11.9).   NON FORMULARY Uses a CPAP machine at night   OXYGEN Inhale 3 L into the lungs at bedtime.    acetaminophen (TYLENOL) 325 MG tablet Take 325 mg by mouth daily.  (Patient not taking: Reported on 04/18/2020)   amLODipine (NORVASC) 10 MG tablet Take 1 tablet (10 mg total) by mouth daily. (Patient not taking: Reported on 04/18/2020)   aspirin 81 MG EC tablet Take by mouth. (Patient not taking: Reported on 04/18/2020)   atorvastatin (LIPITOR) 10 MG tablet Take 1 tablet (10 mg total) by mouth daily. (Patient not taking: Reported on 04/18/2020)   buPROPion (WELLBUTRIN XL) 300 MG 24 hr tablet Take 300 mg by mouth 2 (two) times daily.  (Patient not taking: Reported on 04/18/2020)   Fluticasone-Salmeterol (ADVAIR DISKUS) 250-50 MCG/DOSE AEPB Inhale 1 puff into the lungs 2 (two) times daily. (Patient not taking: Reported on 04/18/2020)   lisinopril (PRINIVIL,ZESTRIL) 40 MG tablet Take 1 tablet (40 mg total) by mouth daily. (Patient not taking: Reported on 04/18/2020)   Multiple Vitamin (MULTIVITAMIN WITH MINERALS) TABS tablet Take 1 tablet by mouth daily. (Patient not taking: Reported on 04/18/2020)   nicotine (NICODERM CQ - DOSED IN MG/24 HOURS) 21 mg/24hr patch Place 21 mg onto the skin daily.  (Patient not taking: Reported on 04/18/2020)   oxybutynin (DITROPAN) 5 MG tablet Take 1 tablet (5 mg total) by mouth at bedtime. (Patient not taking: Reported on  04/18/2020)   sulfamethoxazole-trimethoprim (BACTRIM) 400-80 MG tablet Take 1 tablet by mouth 2 (two) times daily. (Patient not taking: Reported on 04/18/2020)   triamterene-hydrochlorothiazide (MAXZIDE-25) 37.5-25 MG tablet Take 1 tablet by mouth daily. (Patient not taking: Reported on 04/18/2020)   No facility-administered encounter medications on file as of 04/18/2020.    Allergies (verified) Cephalexin   History: Past  Medical History:  Diagnosis Date   Acute cholecystitis 06/29/2017   ADHD    Affective disorder, major 04/19/2015   Airway hyperreactivity 04/19/2015   Anxiety    Anxiety disorder 04/19/2015   Arthritis 04/19/2015   At risk for injury 04/19/2015   CAFL (chronic airflow limitation) ([place]) 04/19/2015   Cholecystitis 06/29/2017   Chronic gastritis 04/19/2015   Compulsive tobacco user syndrome 04/19/2015   COPD (chronic obstructive pulmonary disease) ([place])    Depression    Diabetes mellitus without complication ([place])    Dysrhythmia    Encounter for immunization 04/19/2015   GERD (gastroesophageal reflux disease)    H/O elevated lipids 04/19/2015   Hypertension    Mechanical and motor problems with internal organs 04/19/2015   Obesity 09/10/2015   Oxygen deficiency    Sleep apnea    Trigger thumb of left hand 04/15/2017   Past Surgical History:  Procedure Laterality Date   CHOLECYSTECTOMY N/A 08/06/2017   Procedure: LAPAROSCOPIC CHOLECYSTECTOMY;  Surgeon: Florene Glen, MD;  Location: [facility] ORS;  Service: General;  Laterality: N/A;   cyst removal from wrist     [place] N/A 08/30/2018   Procedure: HERNIA REPAIR INCISIONAL;  Surgeon: Olean Ree, MD;  Location: [facility] ORS;  Service: General;  Laterality: N/A;   PACEMAKER INSERTION Left 10/13/2018   Procedure: INSERTION PACEMAKER;  Surgeon: Isaias Cowman, MD;  Location: [facility] ORS;  Service: Cardiovascular;  Laterality: Left;   UPPER GASTROINTESTINAL ENDOSCOPY     Family History  Problem Relation Age of Onset   Stroke Mother 88   Diabetes Mother    Kidney disease Mother    Hypertension Mother    COPD Maternal Aunt    Diabetes Maternal Grandmother    Heart disease Sister    Social History   Socioeconomic History   Marital status: Single    Spouse name: Not on file   Number of children: 0   Years of education: 9th grade   Highest education level: GED or equivalent    Occupational History   Occupation: disability  Tobacco Use   Smoking status: Current Every Day Smoker    Packs/day: 2.00    Types: Cigarettes   Smokeless tobacco: Never Used  Scientific laboratory technician Use: Former  Substance and Sexual Activity   Alcohol use: Not Currently    Alcohol/week: 0.0 standard drinks   Drug use: No   Sexual activity: Not Currently  Other Topics Concern   Not on file  Social History Narrative   Not on file   Social Determinants of Health   Financial Resource Strain: Low Risk    Difficulty of Paying Living Expenses: Not hard at all  Food Insecurity: No Food Insecurity   Worried About Charity fundraiser in the Last Year: Never true   Ran Out of Food in the Last Year: Never true  Transportation Needs: No Transportation Needs   Lack of Transportation (Medical): No   Lack of Transportation (Non-Medical): No  Physical Activity: Inactive   Days of Exercise per Week: 0 days   Minutes of Exercise per Session: 0 min  Stress:  No Stress Concern Present   Feeling of Stress : Not at all  Social Connections: Moderately Isolated   Frequency of Communication with Friends and Family: More than three times a week   Frequency of Social Gatherings with Friends and Family: More than three times a week   Attends Religious Services: Never   Marine scientist or Organizations: Yes   Attends Archivist Meetings: Never   Marital Status: Never married   Tobacco Counseling Ready to quit: No Counseling given: No   Clinical Intake:  Pre-visit preparation completed: Yes  Pain : No/denies pain     Nutritional Risks: None Diabetes: Yes  How often do you need to have someone help you when you read instructions, pamphlets, or other written materials from your doctor or pharmacy?: 1 - Never   Diabetes:  Is the patient diabetic?  Yes  If diabetic, was a CBG obtained today?  No  Did the patient bring in their glucometer from home?   No  How often do you monitor your CBG's? Does not check BS.   Financial Strains and Diabetes Management:  Are you having any financial strains with the device, your supplies or your medication? No .  Does the patient want to be seen by Chronic Care Management for management of their diabetes?  No  Would the patient like to be referred to a Nutritionist or for Diabetic Management?  No   Diabetic Exams:  Diabetic Eye Exam: Completed 01/11/20. Repeat yearly.  Diabetic Foot Exam: Completed 04/21/19. Repeat yearly. Interpreter Needed?: No  Information entered by :: [facility], LPN  Activities of Daily Living In your present state of health, do you have any difficulty performing the following activities: 04/18/2020  Hearing? N  Vision? N  Difficulty concentrating or making decisions? N  Walking or climbing stairs? Y  Comment Due to SOB.  Dressing or bathing? N  Doing errands, shopping? N  Preparing Food and eating ? N  Using the Toilet? N  In the past six months, have you accidently leaked urine? Y  Comment Has an overactive bladder.  Do you have problems with loss of bowel control? N  Managing your Medications? N  Managing your Finances? N  Housekeeping or managing your Housekeeping? N  Some recent data might be hidden     Immunizations and Health Maintenance Immunization History  Administered Date(s) Administered   Influenza,inj,Quad PF,6+ Mos 09/13/2013, 09/10/2015, 09/08/2017, 08/05/2018, 07/22/2019   Influenza-Unspecified 09/13/2013, 06/03/2014   Pneumococcal Polysaccharide-23 06/27/2014   Health Maintenance Due  Topic Date Due   COVID-19 Vaccine (1) Never done   COLONOSCOPY  07/25/2019    Patient Care Team: Paulene Floor as PCP - General (Physician Assistant) Isaias Cowman, MD as Consulting Physician (Cardiology) Festus Aloe, MD as Consulting Physician (Urology) Dionisio David, MD as Consulting Physician (Cardiology) Myer Haff, MD as  Referring Physician (Psychiatry)  Indicate any recent Medical Services you may have received from other than [facility] providers in the past year (date may be approximate).    Assessment:   This is a routine wellness examination for Eric Lawrence.  Hearing/Vision screen No exam data present  Dietary issues and exercise activities discussed: Current Exercise Habits: The patient does not participate in regular exercise at present, Exercise limited by: respiratory conditions(s);cardiac condition(s)  Goals     Quit Smoking     Recommend to continue efforts to reduce smoking habits until no longer smoking.       Depression Screen [facility] 2/9  Scores 04/18/2020 11/08/2019 05/03/2019 08/05/2018  PHQ - 2 Score 1 0 4 5  PHQ- 9 Score - 0 13 24    Fall Risk Fall Risk  04/18/2020 09/17/2018 08/05/2018 06/08/2015 04/19/2015  Falls in the past year? 0 0 No No No  Number falls in past yr: 0 - - - -  Injury with Fall? 0 - - - -    FALL RISK PREVENTION PERTAINING TO THE HOME:  Any stairs in or around the home? Yes  If so, are there any without handrails? No   Home free of loose throw rugs in walkways, pet beds, electrical cords, etc? Yes  Adequate lighting in your home to reduce risk of falls? Yes   ASSISTIVE DEVICES UTILIZED TO PREVENT FALLS:  Life alert? No  Use of a cane, walker or w/c? No  Grab bars in the bathroom? Yes  Shower chair or bench in shower? No  Elevated toilet seat or a handicapped toilet? Yes    TIMED UP AND GO:  Was the test performed? No .    Cognitive Function:     6CIT Screen 04/18/2020  What Year? 0 points  What month? 0 points  What time? 0 points  Count back from 20 0 points  Months in reverse 0 points  Repeat phrase 0 points  Total Score 0    Screening Tests Health Maintenance  Topic Date Due   COVID-19 Vaccine (1) Never done   COLONOSCOPY  07/25/2019   FOOT EXAM  04/20/2020   HEMOGLOBIN A1C  05/07/2020   INFLUENZA VACCINE  06/03/2020   OPHTHALMOLOGY  EXAM  01/10/2021   TETANUS/TDAP  01/01/2025   PNEUMOCOCCAL POLYSACCHARIDE VACCINE AGE 49-64 HIGH RISK  Completed   Hepatitis C Screening  Completed   HIV Screening  Completed    Qualifies for Shingles Vaccine? No    Tdap: Although this vaccine is not a covered service during a Wellness Exam, does the patient still wish to receive this vaccine today?  No . Advised may receive this vaccine at local pharmacy or Health Dept. Aware to provide a copy of the vaccination record if obtained from local pharmacy or Health Dept. Verbalized acceptance and understanding.  Flu Vaccine: Up to date   Cancer Screenings:  Colorectal Screening: Completed 07/24/14. Repeat every 5 years. Referral to GI placed today. Pt aware the office will call re: appt.  Lung Cancer Screening: (Low Dose CT Chest recommended if Age 96-80 years, 30 pack-year currently smoking OR have quit w/in 15years.) does qualify however had this completed 08/21/19. Repeat yearly.  Additional Screening:  Hepatitis C Screening: Up to date  Dental Screening: Recommended annual dental exams for proper oral hygiene  Community Resource Referral:  CRR required this visit? No      Plan:  I have personally reviewed and addressed the Medicare Annual Wellness questionnaire and have noted the following in the patients chart:  A. Medical and social history B. Use of alcohol, tobacco or illicit drugs  C. Current medications and supplements D. Functional ability and status E.  Nutritional status F.  Physical activity G. Advance directives H. List of other physicians I.  Hospitalizations, surgeries, and ER visits in previous 12 months J.  [place] such as hearing and vision if needed, cognitive and depression L. Referrals and appointments   In addition, I have reviewed and discussed with patient certain preventive protocols, quality metrics, and best practice recommendations. A written personalized care plan for  preventive services as well as  general preventive health recommendations were provided to patient.   [place] Score, [place]   [id number]  Nurse Health Advisor    Nurse Notes: None.

## 2020-04-18 ENCOUNTER — Ambulatory Visit (INDEPENDENT_AMBULATORY_CARE_PROVIDER_SITE_OTHER): Payer: Medicare Other

## 2020-04-18 ENCOUNTER — Other Ambulatory Visit: Payer: Self-pay

## 2020-04-18 DIAGNOSIS — Z1211 Encounter for screening for malignant neoplasm of colon: Secondary | ICD-10-CM | POA: Diagnosis not present

## 2020-04-18 DIAGNOSIS — Z0000 Encounter for general adult medical examination without abnormal findings: Secondary | ICD-10-CM

## 2020-04-18 NOTE — Patient Instructions (Signed)
[name] , Thank you for taking time to come for your Medicare Wellness Visit. I appreciate your ongoing commitment to your health goals. Please review the following plan we discussed and let me know if I can assist you in the future.   Screening recommendations/referrals: Colonoscopy: Referral to GI placed today. Pt aware the office will call re: appt. Recommended yearly ophthalmology/optometry visit for glaucoma screening and checkup Recommended yearly dental visit for hygiene and checkup  Vaccinations: Influenza vaccine: Up to date Tdap vaccine: Pt declines today.   Advanced directives: Please bring a copy of your POA (Power of Attorney) and/or Living Will to your next appointment.   Conditions/risks identified: Smoking cessation discussed today.   Next appointment: 05/09/20 @ 9:20 AM with [name]   Preventive Care 56 Years and Older, Male Preventive care refers to lifestyle choices and visits with your health care provider that can promote health and wellness. What does preventive care include?  A yearly physical exam. This is also called an annual well check.  Dental exams once or twice a year.  Routine eye exams. Ask your health care provider how often you should have your eyes checked.  Personal lifestyle choices, including:  Daily care of your teeth and gums.  Regular physical activity.  Eating a healthy diet.  Avoiding tobacco and drug use.  Limiting alcohol use.  Practicing safe sex.  Taking low doses of aspirin every day.  Taking vitamin and mineral supplements as recommended by your health care provider. What happens during an annual well check? The services and screenings done by your health care provider during your annual well check will depend on your age, overall health, lifestyle risk factors, and family history of disease. Counseling  Your health care provider may ask you questions about your:  Alcohol use.  Tobacco use.  Drug  use.  Emotional well-being.  Home and relationship well-being.  Sexual activity.  Eating habits.  History of falls.  Memory and ability to understand (cognition).  Work and work Astronomer. Screening  You may have the following tests or measurements:  Height, weight, and BMI.  Blood pressure.  Lipid and cholesterol levels. These may be checked every 5 years, or more frequently if you are over 23 years old.  Skin check.  Lung cancer screening. You may have this screening every year starting at age 30 if you have a 30-pack-year history of smoking and currently smoke or have quit within the past 15 years.  Fecal occult blood test (FOBT) of the stool. You may have this test every year starting at age 55.  Flexible sigmoidoscopy or colonoscopy. You may have a sigmoidoscopy every 5 years or a colonoscopy every 10 years starting at age 75.  Prostate cancer screening. Recommendations will vary depending on your family history and other risks.  Hepatitis C blood test.  Hepatitis B blood test.  Sexually transmitted disease (STD) testing.  Diabetes screening. This is done by checking your blood sugar (glucose) after you have not eaten for a while (fasting). You may have this done every 1-3 years.  Abdominal aortic aneurysm (AAA) screening. You may need this if you are a current or former smoker.  Osteoporosis. You may be screened starting at age 15 if you are at high risk. Talk with your health care provider about your test results, treatment options, and if necessary, the need for more tests. Vaccines  Your health care provider may recommend certain vaccines, such as:  Influenza vaccine. This is recommended every  year.  Tetanus, diphtheria, and acellular pertussis (Tdap, Td) vaccine. You may need a Td booster every 10 years.  Zoster vaccine. You may need this after age 61.  Pneumococcal 13-valent conjugate (PCV13) vaccine. One dose is recommended after age  11.  Pneumococcal polysaccharide (PPSV23) vaccine. One dose is recommended after age 51. Talk to your health care provider about which screenings and vaccines you need and how often you need them. This information is not intended to replace advice given to you by your health care provider. Make sure you discuss any questions you have with your health care provider. Document Released: 11/16/2015 Document Revised: 07/09/2016 Document Reviewed: 08/21/2015 Elsevier Interactive Patient Education  2017 [place] Prevention in the Home Falls can cause injuries. They can happen to people of all ages. There are many things you can do to make your home safe and to help prevent falls. What can I do on the outside of my home?  Regularly fix the edges of walkways and driveways and fix any cracks.  Remove anything that might make you trip as you walk through a door, such as a raised step or threshold.  Trim any bushes or trees on the path to your home.  Use bright outdoor lighting.  Clear any walking paths of anything that might make someone trip, such as rocks or tools.  Regularly check to see if handrails are loose or broken. Make sure that both sides of any steps have handrails.  Any raised decks and porches should have guardrails on the edges.  Have any leaves, snow, or ice cleared regularly.  Use sand or salt on walking paths during winter.  Clean up any spills in your garage right away. This includes oil or grease spills. What can I do in the bathroom?  Use night lights.  Install grab bars by the toilet and in the tub and shower. Do not use towel bars as grab bars.  Use non-skid mats or decals in the tub or shower.  If you need to sit down in the shower, use a plastic, non-slip stool.  Keep the floor dry. Clean up any water that spills on the floor as soon as it happens.  Remove soap buildup in the tub or shower regularly.  Attach bath mats securely with double-sided  non-slip rug tape.  Do not have throw rugs and other things on the floor that can make you trip. What can I do in the bedroom?  Use night lights.  Make sure that you have a light by your bed that is easy to reach.  Do not use any sheets or blankets that are too big for your bed. They should not hang down onto the floor.  Have a firm chair that has side arms. You can use this for support while you get dressed.  Do not have throw rugs and other things on the floor that can make you trip. What can I do in the kitchen?  Clean up any spills right away.  Avoid walking on wet floors.  Keep items that you use a lot in easy-to-reach places.  If you need to reach something above you, use a strong step stool that has a grab bar.  Keep electrical cords out of the way.  Do not use floor polish or wax that makes floors slippery. If you must use wax, use non-skid floor wax.  Do not have throw rugs and other things on the floor that can make you trip. What can  I do with my stairs?  Do not leave any items on the stairs.  Make sure that there are handrails on both sides of the stairs and use them. Fix handrails that are broken or loose. Make sure that handrails are as long as the stairways.  Check any carpeting to make sure that it is firmly attached to the stairs. Fix any carpet that is loose or worn.  Avoid having throw rugs at the top or bottom of the stairs. If you do have throw rugs, attach them to the floor with carpet tape.  Make sure that you have a light switch at the top of the stairs and the bottom of the stairs. If you do not have them, ask someone to add them for you. What else can I do to help prevent falls?  Wear shoes that:  Do not have high heels.  Have rubber bottoms.  Are comfortable and fit you well.  Are closed at the toe. Do not wear sandals.  If you use a stepladder:  Make sure that it is fully opened. Do not climb a closed stepladder.  Make sure that both  sides of the stepladder are locked into place.  Ask someone to hold it for you, if possible.  Clearly mark and make sure that you can see:  Any grab bars or handrails.  First and last steps.  Where the edge of each step is.  Use tools that help you move around (mobility aids) if they are needed. These include:  Canes.  Walkers.  Scooters.  Crutches.  Turn on the lights when you go into a dark area. Replace any light bulbs as soon as they burn out.  Set up your furniture so you have a clear path. Avoid moving your furniture around.  If any of your floors are uneven, fix them.  If there are any pets around you, be aware of where they are.  Review your medicines with your doctor. Some medicines can make you feel dizzy. This can increase your chance of falling. Ask your doctor what other things that you can do to help prevent falls. This information is not intended to replace advice given to you by your health care provider. Make sure you discuss any questions you have with your health care provider. Document Released: 08/16/2009 Document Revised: 03/27/2016 Document Reviewed: 11/24/2014 Elsevier Interactive Patient Education  2017 [employer].

## 2020-04-26 ENCOUNTER — Other Ambulatory Visit: Payer: Self-pay

## 2020-04-26 ENCOUNTER — Ambulatory Visit (INDEPENDENT_AMBULATORY_CARE_PROVIDER_SITE_OTHER): Payer: Self-pay | Admitting: Gastroenterology

## 2020-04-26 DIAGNOSIS — Z1211 Encounter for screening for malignant neoplasm of colon: Secondary | ICD-10-CM

## 2020-04-26 MED ORDER — NA SULFATE-K SULFATE-MG SULF 17.5-3.13-1.6 GM/177ML PO SOLN: 1.0000 | Freq: Once | ORAL | 0 refills | Status: AC

## 2020-04-26 NOTE — Progress Notes (Signed)
Gastroenterology Pre-Procedure Review  Request Date: Tuesday 05/01/20 Requesting Physician: Dr. Vicente Males  PATIENT REVIEW QUESTIONS: The patient responded to the following health history questions as indicated:    1. Are you having any GI issues? yes (Patient informed Almyra Free that his belly button hurts after over eating) 2. Do you have a personal history of Polyps? no 3. Do you have a family history of Colon Cancer or Polyps? no 4. Diabetes Mellitus? no 5. Joint replacements in the past 12 months?no 6. Major health problems in the past 3 months?no 7. Any artificial heart valves, MVP, or defibrillator?yes (pacemaker cardiac clearance sent to cardiologist)    MEDICATIONS & ALLERGIES:    Patient reports the following regarding taking any anticoagulation/antiplatelet therapy:   Plavix, Coumadin, Eliquis, Xarelto, Lovenox, Pradaxa, Brilinta, or Effient? no Aspirin?   Patient confirms/reports the following medications:  Current Outpatient Medications  Medication Sig Dispense Refill   acetaminophen (TYLENOL) 325 MG tablet Take 325 mg by mouth daily.  (Patient not taking: Reported on 04/18/2020)     acetaminophen (TYLENOL) 500 MG tablet Take 500 mg by mouth every 6 (six) hours as needed.     amLODipine (NORVASC) 10 MG tablet Take 1 tablet (10 mg total) by mouth daily. (Patient not taking: Reported on 04/18/2020) 90 tablet 0   aspirin 81 MG EC tablet Take by mouth. (Patient not taking: Reported on 04/18/2020)     atorvastatin (LIPITOR) 10 MG tablet Take 1 tablet (10 mg total) by mouth daily. (Patient not taking: Reported on 04/18/2020) 90 tablet 3   blood glucose meter kit and supplies Check glucose once a day.  (FOR ICD-10 E10.9, E11.9). 1 each 0   buPROPion (WELLBUTRIN XL) 300 MG 24 hr tablet Take 300 mg by mouth 2 (two) times daily.  (Patient not taking: Reported on 04/18/2020)     Fluticasone-Salmeterol (ADVAIR DISKUS) 250-50 MCG/DOSE AEPB Inhale 1 puff into the lungs 2 (two) times daily.  (Patient not taking: Reported on 04/18/2020) 1 each 3   lisinopril (PRINIVIL,ZESTRIL) 40 MG tablet Take 1 tablet (40 mg total) by mouth daily. (Patient not taking: Reported on 04/18/2020) 90 tablet 3   Multiple Vitamin (MULTIVITAMIN WITH MINERALS) TABS tablet Take 1 tablet by mouth daily. (Patient not taking: Reported on 04/18/2020)     nicotine (NICODERM CQ - DOSED IN MG/24 HOURS) 21 mg/24hr patch Place 21 mg onto the skin daily.  (Patient not taking: Reported on 04/18/2020)  3   NON FORMULARY Uses a CPAP machine at night (Patient not taking: Reported on 04/26/2020)     oxybutynin (DITROPAN) 5 MG tablet Take 1 tablet (5 mg total) by mouth at bedtime. (Patient not taking: Reported on 04/18/2020) 90 tablet 3   OXYGEN Inhale 3 L into the lungs at bedtime.  (Patient not taking: Reported on 04/26/2020)     sulfamethoxazole-trimethoprim (BACTRIM) 400-80 MG tablet Take 1 tablet by mouth 2 (two) times daily. (Patient not taking: Reported on 04/18/2020)     triamterene-hydrochlorothiazide (MAXZIDE-25) 37.5-25 MG tablet Take 1 tablet by mouth daily. (Patient not taking: Reported on 04/18/2020) 90 tablet 3   No current facility-administered medications for this visit.    Patient confirms/reports the following allergies:  Allergies  Allergen Reactions   Cephalexin Rash    severe    No orders of the defined types were placed in this encounter.   AUTHORIZATION INFORMATION Primary Insurance: 1D#: Group #:  Secondary Insurance: 1D#: Group #:  SCHEDULE INFORMATION: Date: Tuesday 06/29/21Time: Location:[facility]

## 2020-04-27 ENCOUNTER — Encounter: Payer: Self-pay | Admitting: Urology

## 2020-04-27 ENCOUNTER — Ambulatory Visit: Payer: Medicare Other | Admitting: Urology

## 2020-04-27 ENCOUNTER — Other Ambulatory Visit
Admission: RE | Admit: 2020-04-27 | Discharge: 2020-04-27 | Disposition: A | Discharge status: Home / Self Care | Payer: Medicare Other | Source: Ambulatory Visit | Attending: Gastroenterology | Admitting: Gastroenterology

## 2020-04-27 ENCOUNTER — Other Ambulatory Visit: Payer: Self-pay

## 2020-04-27 DIAGNOSIS — Z01812 Encounter for preprocedural laboratory examination: Secondary | ICD-10-CM | POA: Diagnosis present

## 2020-04-27 DIAGNOSIS — Z20822 Contact with and (suspected) exposure to covid-19: Secondary | ICD-10-CM | POA: Diagnosis not present

## 2020-04-28 LAB — SARS CORONAVIRUS 2 (TAT 6-24 HRS): SARS Coronavirus 2: NEGATIVE

## 2020-05-01 ENCOUNTER — Ambulatory Visit: Admission: RE | Admit: 2020-05-01 | Payer: Medicare Other | Source: Home / Self Care | Admitting: Gastroenterology

## 2020-05-01 ENCOUNTER — Encounter: Admission: RE | Payer: Self-pay | Source: Home / Self Care

## 2020-05-01 SURGERY — COLONOSCOPY WITH PROPOFOL
Anesthesia: General

## 2020-05-02 ENCOUNTER — Telehealth: Payer: Self-pay

## 2020-05-02 NOTE — Telephone Encounter (Signed)
Please review. Thanks!  

## 2020-05-02 NOTE — Telephone Encounter (Addendum)
Called patient to move appointment to 05/04/2020 and patient declined and stated that he will not be able to make that day and wanted to keep his already scheduled appointment (05/09/2020). He declines having any suicidal plans or thoughts. Just a [place]

## 2020-05-02 NOTE — Telephone Encounter (Signed)
Spoke with Leanora Ivanoff, PA-C from [facility] about this patient. Patient presented today for cardiac clearance for colonoscopy but was ultimately declined due to uncontrolled HTN. He regularly does not take his medications and reported he has not done so since February. He was given metoprolol and hydralazine.   He also made a comment that he was waiting for a slow death and waiting for a heart attack. He is on wellbutrin and is under the care of psychiatry and counseling, whom he apparently hasn't seen in a while. He was asked to clarify his statements and he reported that he did not have active suicidal thoughts or plan currently. He was informed if he did he would have to be seen in the ER.   He has an appointment here on 05/09/2020 which if he is not actively suicidal or having any plan I think is fine.   We can call and offer him an earlier appointment if he would like and just confirm that he is not having actively suicidal thoughts or plan.

## 2020-05-02 NOTE — Telephone Encounter (Signed)
Copied from CRM [id number]. Topic: General - Inquiry >> May 02, 2020 11:59 AM Leary Roca wrote: Reason for CRM: Genia Plants (PA)  from [facility] clinic and Cardiology calling in regarding mutual. She would like to [facility] with [facility]    Call back [phone]

## 2020-05-08 NOTE — Progress Notes (Signed)
Established patient visit   Patient: Eric Lawrence   DOB: 10/07/1966   54 y.o. Male  MRN: [id number] Visit Date: 05/09/2020  Today's healthcare provider: Trinna Post, PA-C   Chief Complaint  Patient presents with  . Diabetes  . Hyperlipidemia  . Hypertension  I,Porsha C McClurkin,acting as a scribe for [employer], PA-C.,have documented all relevant documentation on the behalf of Trinna Post, PA-C,as directed by  Trinna Post, PA-C while in the presence of Trinna Post, PA-C.  Subjective    HPI  Diabetes Mellitus Type II, follow-up  Lab Results  Component Value Date   HGBA1C 6.4 (A) 11/08/2019   HGBA1C 6.4 (A) 07/22/2019   HGBA1C 6.3 (A) 04/21/2019   Last seen for diabetes 6 months ago.  Management since then includes diet controlled.  He reports poor compliance with treatment. He is not having side effects.   Home blood sugar records: not being checked.  Episodes of hypoglycemia? No    Current insulin regiment: none Most Recent Eye Exam:   --------------------------------------------------------------------------------------------------- Hypertension, follow-up  BP Readings from Last 3 Encounters:  05/09/20 (!) 162/100  01/20/20 (!) 199/127  12/09/19 (!) 180/100   Wt Readings from Last 3 Encounters:  05/09/20 262 lb 3.2 oz (118.9 kg)  01/20/20 262 lb (118.8 kg)  12/09/19 262 lb 12.8 oz (119.2 kg)     He was last seen for hypertension 6 months ago.  BP at that visit was 151/102. Management since then includes starting carvedilol 6.25 mg BID and hydralazine 25 mg TID. This was started by [facility] PA who was asked to clear patient for colonoscopy due to extremely high blood pressure. He was not cleared for the procedure on that day. He has for a total of 10 days been compliant with carvedilol and hydralazine. He is not however taking any of the other blood pressure medications prescribed to him including norvasc 10 mg  daily, lisinopril 40 mg daily or mazide 37.5-25 mg daily. He has historically been extremely noncompliant. He has never taken his medications as prescribed and this is well documented in prior notes. He has had secondary hypertension workup which was negative. He reports depression being the reason he is not taking his medicines.  He reports poor compliance with treatment. He is not having side effects.  He is not exercising. He is adherent to low salt diet.   Outside blood pressures are arranges around 170's-180's-/90's-100's  He does smoke.  Use of agents associated with hypertension: none.   --------------------------------------------------------------------------------------------------- Lipid/Cholesterol, follow-up  Last Lipid Panel: Lab Results  Component Value Date   CHOL 129 08/05/2018   LDLCALC 64 08/05/2018   HDL 30 (L) 08/05/2018   TRIG 176 (H) 08/05/2018    He was last seen for this 6 months ago.  Management since that visit includes no changes.  He reports poor compliance with treatment. Not taking Lipitor.  He is not having side effects.   Symptoms: No appetite changes No foot ulcerations  No chest pain No chest pressure/discomfort  No dyspnea No orthopnea  No fatigue No lower extremity edema  No palpitations No paroxysmal nocturnal dyspnea  No nausea No numbness or tingling of extremity  No polydipsia No polyuria  No speech difficulty No syncope   He is following a Regular diet. Current exercise: no regular exercise and yard work  Last metabolic panel Lab Results  Component Value Date   GLUCOSE 146 (H) 08/21/2019   NA  139 08/21/2019   K 4.2 08/21/2019   BUN 23 (H) 08/21/2019   CREATININE 1.18 08/21/2019   GFRNONAA >60 08/21/2019   GFRAA >60 08/21/2019   CALCIUM 10.2 08/21/2019   AST 24 12/23/2018   ALT 43 12/23/2018   The ASCVD Risk score Mikey Bussing DC Jr., et al., 2013) failed to calculate for the following reasons:   The valid total cholesterol  range is 130 to 320 mg/dL  During his colonoscopy clearance he made a comment to the provider that he was not taking his medications and is trying to die a slow death. He clarifies today that he felt that way previously but does not feel that way any longer. Denies suicidal or homicidal thoughts. Followed by Dr. Carlos Levering Su for psychiatry but reports he has not seen this provider in over a year.  --------------------------------------------------------------------------------------------------- Patient reports he smokes a pack a week.      Medications: Outpatient Medications Prior to Visit  Medication Sig  . carvedilol (COREG) 6.25 MG tablet Take 6.25 mg by mouth 2 (two) times daily.  . [DISCONTINUED] hydrALAZINE (APRESOLINE) 25 MG tablet Take 25 mg by mouth 3 (three) times daily.  Marland Kitchen acetaminophen (TYLENOL) 325 MG tablet Take 325 mg by mouth daily.  (Patient not taking: Reported on 04/18/2020)  . acetaminophen (TYLENOL) 500 MG tablet Take 500 mg by mouth every 6 (six) hours as needed. (Patient not taking: Reported on 04/26/2020)  . amLODipine (NORVASC) 10 MG tablet Take 1 tablet (10 mg total) by mouth daily. (Patient not taking: Reported on 04/18/2020)  . aspirin 81 MG EC tablet Take by mouth. (Patient not taking: Reported on 04/18/2020)  . atorvastatin (LIPITOR) 10 MG tablet Take 1 tablet (10 mg total) by mouth daily. (Patient not taking: Reported on 04/18/2020)  . blood glucose meter kit and supplies Check glucose once a day.  (FOR ICD-10 E10.9, E11.9). (Patient not taking: Reported on 04/26/2020)  . buPROPion (WELLBUTRIN XL) 300 MG 24 hr tablet Take 300 mg by mouth 2 (two) times daily.  (Patient not taking: Reported on 04/18/2020)  . Fluticasone-Salmeterol (ADVAIR DISKUS) 250-50 MCG/DOSE AEPB Inhale 1 puff into the lungs 2 (two) times daily. (Patient not taking: Reported on 04/18/2020)  . lisinopril (PRINIVIL,ZESTRIL) 40 MG tablet Take 1 tablet (40 mg total) by mouth daily. (Patient not taking:  Reported on 04/18/2020)  . Multiple Vitamin (MULTIVITAMIN WITH MINERALS) TABS tablet Take 1 tablet by mouth daily. (Patient not taking: Reported on 04/18/2020)  . nicotine (NICODERM CQ - DOSED IN MG/24 HOURS) 21 mg/24hr patch Place 21 mg onto the skin daily.  (Patient not taking: Reported on 04/18/2020)  . NON FORMULARY Uses a CPAP machine at night (Patient not taking: Reported on 04/26/2020)  . OXYGEN Inhale 3 L into the lungs at bedtime.  (Patient not taking: Reported on 04/26/2020)  . triamterene-hydrochlorothiazide (MAXZIDE-25) 37.5-25 MG tablet Take 1 tablet by mouth daily. (Patient not taking: Reported on 04/18/2020)  . [DISCONTINUED] oxybutynin (DITROPAN) 5 MG tablet Take 1 tablet (5 mg total) by mouth at bedtime. (Patient not taking: Reported on 04/18/2020)  . [DISCONTINUED] sulfamethoxazole-trimethoprim (BACTRIM) 400-80 MG tablet Take 1 tablet by mouth 2 (two) times daily. (Patient not taking: Reported on 04/18/2020)   No facility-administered medications prior to visit.    Review of Systems    Objective    BP (!) 162/100 (BP Location: Left Arm, Patient Position: Sitting, Cuff Size: Normal)   Pulse 84   Temp (!) 96.8 F (36 C) (Temporal)   Wt 262  lb 3.2 oz (118.9 kg)   SpO2 97%   BMI 33.66 kg/m    Physical Exam Constitutional:      Appearance: Normal appearance.  Cardiovascular:     Rate and Rhythm: Normal rate and regular rhythm.     Pulses: Normal pulses.     Heart sounds: Normal heart sounds.  Pulmonary:     Effort: No respiratory distress.     Breath sounds: Normal breath sounds. No wheezing.  Chest:     Chest wall: No tenderness.  Musculoskeletal:     Right foot: Normal.     Left foot: Normal.     Comments: Hypertrophy  Feet:     Comments: Yellow hypertrophied toenails on both feet.  Skin:    General: Skin is warm and dry.  Neurological:     General: No focal deficit present.     Mental Status: He is alert and oriented to person, place, and time.  Psychiatric:         Mood and Affect: Mood normal.        Behavior: Behavior normal.       No results found for any visits on 05/09/20.  Assessment & Plan    1. Essential hypertension Uncontrolled Patient was advised to started taking current medications daily. Recheck metabolic panel. Patient has been historically noncompliant with medications. I do not know what will motivate him to take medications. He is generally very evasive about the exact reason he won't take them. Reports depression. He has been advised to follow up with his psychiatrist. HE has been worked up for secondary hypertension with negative results. He is simply noncompliant with medications. Counseled about adverse effects including heart attack, stroke, CHF. Refer to pharmacist for assistance with medication compliance, education, etc. Stop hydralazine. Keep Carvedilol. Take amlodipine, Lisinopril and Maxzide as prescribed.   - CBC with Differential/Platelet - Comprehensive metabolic panel - TSH  2. Diabetes mellitus without complication (HCC) Uncontrolled, a1c Is 7.3%.  Continue current medications UTD on vaccines, eye exam, foot exam Noncompliant with ACEi Noncompliant with statin Discussed diet and exercise  - Comprehensive metabolic panel - Hemoglobin A1c - TSH  3. Hyperlipidemia associated with type 2 diabetes mellitus ([place]) Previously well controlled Continue statin Repeat FLP and CMP Goal LDL < 70  - CBC with Differential/Platelet - Comprehensive metabolic panel - Lipid panel - TSH  4. Encounter for medication management Referral was placed to pharmacy for medication management as below - Ambulatory referral to Chronic Care Management Services  5. Tinea pedis of left foot   Referred to podiatry.   6. COPD  Continues to smoke and has been educated on this. Not willing to stop. Also previously prescribed Symbicort which he never picked up and did not take. He reports he will not take it now and says he  is active.    Return in about 1 month (around 06/09/2020) for HTN.      ITrinna Post, PA-C, have reviewed all documentation for this visit. The documentation on 05/10/20 for the exam, diagnosis, procedures, and orders are all accurate and complete.    Paulene Floor  [facility] [phone] (phone) [phone] (fax)  [place]

## 2020-05-09 ENCOUNTER — Telehealth: Payer: Self-pay | Admitting: Physician Assistant

## 2020-05-09 ENCOUNTER — Encounter: Payer: Self-pay | Admitting: Physician Assistant

## 2020-05-09 ENCOUNTER — Other Ambulatory Visit: Payer: Self-pay

## 2020-05-09 ENCOUNTER — Ambulatory Visit (INDEPENDENT_AMBULATORY_CARE_PROVIDER_SITE_OTHER): Payer: Medicare Other | Admitting: Physician Assistant

## 2020-05-09 VITALS — BP 162/100 | HR 84 | Temp 96.8°F | Wt 262.2 lb

## 2020-05-09 DIAGNOSIS — Z79899 Other long term (current) drug therapy: Secondary | ICD-10-CM

## 2020-05-09 DIAGNOSIS — E1169 Type 2 diabetes mellitus with other specified complication: Secondary | ICD-10-CM | POA: Diagnosis not present

## 2020-05-09 DIAGNOSIS — I10 Essential (primary) hypertension: Secondary | ICD-10-CM

## 2020-05-09 DIAGNOSIS — E785 Hyperlipidemia, unspecified: Secondary | ICD-10-CM

## 2020-05-09 DIAGNOSIS — J410 Simple chronic bronchitis: Secondary | ICD-10-CM

## 2020-05-09 DIAGNOSIS — B353 Tinea pedis: Secondary | ICD-10-CM

## 2020-05-09 DIAGNOSIS — E119 Type 2 diabetes mellitus without complications: Secondary | ICD-10-CM

## 2020-05-09 NOTE — Chronic Care Management (AMB) (Signed)
  Chronic Care Management   Note  05/09/2020 Name: Eric Lawrence MRN: [id number] DOB: 1966/08/16  Eric Lawrence is a 54 y.o. year old male who is a primary care patient of Maryella Shivers and is actively engaged with the care management team. I reached out to Eric Lawrence by phone today to assist with scheduling an initial visit with the Pharmacist  Follow up plan: Telephone appointment with care management team member scheduled for:06/04/2020.  Gwenevere Ghazi  Care Guide, Embedded Care Coordination [facility]  [place], [place] [address] Direct Dial: [phone] [email or web address] Website: [email or web address]

## 2020-05-10 ENCOUNTER — Encounter: Payer: Self-pay | Admitting: Physician Assistant

## 2020-05-10 LAB — COMPREHENSIVE METABOLIC PANEL
ALT: 26 IU/L (ref 0–44)
AST: 16 IU/L (ref 0–40)
Albumin/Globulin Ratio: 1.5 (ref 1.2–2.2)
Albumin: 4.2 g/dL (ref 3.8–4.9)
Alkaline Phosphatase: 112 IU/L (ref 48–121)
BUN/Creatinine Ratio: 13 (ref 9–20)
BUN: 12 mg/dL (ref 6–24)
Bilirubin Total: 0.4 mg/dL (ref 0.0–1.2)
CO2: 24 mmol/L (ref 20–29)
Calcium: 9.2 mg/dL (ref 8.7–10.2)
Chloride: 101 mmol/L (ref 96–106)
Creatinine, Ser: 0.96 mg/dL (ref 0.76–1.27)
GFR calc Af Amer: 102 mL/min/{1.73_m2} (ref >59)
GFR calc non Af Amer: 88 mL/min/{1.73_m2} (ref >59)
Globulin, Total: 2.8 g/dL (ref 1.5–4.5)
Glucose: 178 mg/dL — ABNORMAL HIGH (ref 65–99)
Potassium: 4.3 mmol/L (ref 3.5–5.2)
Sodium: 140 mmol/L (ref 134–144)
Total Protein: 7 g/dL (ref 6.0–8.5)

## 2020-05-10 LAB — HEMOGLOBIN A1C
Est. average glucose Bld gHb Est-mCnc: 163 mg/dL
Hgb A1c MFr Bld: 7.3 % — ABNORMAL HIGH (ref 4.8–5.6)

## 2020-05-10 LAB — LIPID PANEL
Chol/HDL Ratio: 6.4 ratio — ABNORMAL HIGH (ref 0.0–5.0)
Cholesterol, Total: 179 mg/dL (ref 100–199)
HDL: 28 mg/dL — ABNORMAL LOW (ref >39)
LDL Chol Calc (NIH): 107 mg/dL — ABNORMAL HIGH (ref 0–99)
Triglycerides: 252 mg/dL — ABNORMAL HIGH (ref 0–149)
VLDL Cholesterol Cal: 44 mg/dL — ABNORMAL HIGH (ref 5–40)

## 2020-05-10 LAB — CBC WITH DIFFERENTIAL/PLATELET
Basophils Absolute: 0 10*3/uL (ref 0.0–0.2)
Basos: 1 %
EOS (ABSOLUTE): 0.1 10*3/uL (ref 0.0–0.4)
Eos: 2 %
Hematocrit: 47.2 % (ref 37.5–51.0)
Hemoglobin: 16.3 g/dL (ref 13.0–17.7)
Immature Grans (Abs): 0 10*3/uL (ref 0.0–0.1)
Immature Granulocytes: 0 %
Lymphocytes Absolute: 2 10*3/uL (ref 0.7–3.1)
Lymphs: 29 %
MCH: 32 pg (ref 26.6–33.0)
MCHC: 34.5 g/dL (ref 31.5–35.7)
MCV: 93 fL (ref 79–97)
Monocytes Absolute: 0.4 10*3/uL (ref 0.1–0.9)
Monocytes: 6 %
Neutrophils Absolute: 4.3 10*3/uL (ref 1.4–7.0)
Neutrophils: 62 %
Platelets: 157 10*3/uL (ref 150–450)
RBC: 5.1 x10E6/uL (ref 4.14–5.80)
RDW: 13.3 % (ref 11.6–15.4)
WBC: 6.9 10*3/uL (ref 3.4–10.8)

## 2020-05-10 LAB — TSH: TSH: 1.4 u[IU]/mL (ref 0.450–4.500)

## 2020-05-11 ENCOUNTER — Telehealth: Payer: Self-pay

## 2020-05-11 NOTE — Telephone Encounter (Signed)
Tired calling patient no answer, left voicemail for patient to return call. If patient calls back okay for PEC to advise patient of message.

## 2020-05-11 NOTE — Telephone Encounter (Signed)
-----   Message from Trey Sailors, [place] sent at 05/11/2020  8:51 AM EDT ----- Cholesterol uncontrolled, needs to take cholesterol medication. A1c increased a full point from last check. Will need to discuss starting sugar controlling medication at next visit. Needs to reduce sugar intake.

## 2020-05-12 ENCOUNTER — Telehealth: Payer: Self-pay

## 2020-05-12 NOTE — Telephone Encounter (Signed)
Copied from CRM [id number]. Topic: General - Inquiry >> May 11, 2020  5:15 PM Leary Roca wrote: Reason for CRM: pt returned call  . Gave information in chart . Pt expressed understanding

## 2020-05-14 NOTE — Telephone Encounter (Signed)
See telephone encounter on 05/12/20.

## 2020-05-14 NOTE — Telephone Encounter (Signed)
Attempted to call pt.  Left vm to call office to discuss recent lab results.   

## 2020-06-04 ENCOUNTER — Telehealth: Payer: Self-pay

## 2020-06-04 ENCOUNTER — Ambulatory Visit: Payer: Medicare Other

## 2020-06-04 NOTE — Telephone Encounter (Signed)
Copied from CRM [id number]. Topic: Quick Communication - Appointment Cancellation >> Jun 04, 2020  8:20 AM Tamela Oddi wrote: Patient called to cancel appointment scheduled for 06/04/20, 9am. Patient has not rescheduled their appointment.  Route to department's PEC pool.

## 2020-06-04 NOTE — Chronic Care Management (AMB) (Incomplete)
   Chronic Care Management Pharmacy  Name: Eric Lawrence  MRN: [id number] DOB: [birth year]  Chief Complaint/ HPI  Eric Lawrence,  54 y.o. , male presents for their Initial CCM visit with the clinical pharmacist via telephone due to COVID-19 Pandemic.  PCP : Trinna Post, PA-C  Their chronic conditions include: ***  Office Visits:***  Consult Visit:***  Medications: Outpatient Encounter Medications as of 06/04/2020  Medication Sig  . acetaminophen (TYLENOL) 325 MG tablet Take 325 mg by mouth daily.  (Patient not taking: Reported on 04/18/2020)  . acetaminophen (TYLENOL) 500 MG tablet Take 500 mg by mouth every 6 (six) hours as needed. (Patient not taking: Reported on 04/26/2020)  . amLODipine (NORVASC) 10 MG tablet Take 1 tablet (10 mg total) by mouth daily. (Patient not taking: Reported on 04/18/2020)  . aspirin 81 MG EC tablet Take by mouth. (Patient not taking: Reported on 04/18/2020)  . atorvastatin (LIPITOR) 10 MG tablet Take 1 tablet (10 mg total) by mouth daily. (Patient not taking: Reported on 04/18/2020)  . blood glucose meter kit and supplies Check glucose once a day.  (FOR ICD-10 E10.9, E11.9). (Patient not taking: Reported on 04/26/2020)  . buPROPion (WELLBUTRIN XL) 300 MG 24 hr tablet Take 300 mg by mouth 2 (two) times daily.  (Patient not taking: Reported on 04/18/2020)  . carvedilol (COREG) 6.25 MG tablet Take 6.25 mg by mouth 2 (two) times daily.  . Fluticasone-Salmeterol (ADVAIR DISKUS) 250-50 MCG/DOSE AEPB Inhale 1 puff into the lungs 2 (two) times daily. (Patient not taking: Reported on 04/18/2020)  . lisinopril (PRINIVIL,ZESTRIL) 40 MG tablet Take 1 tablet (40 mg total) by mouth daily. (Patient not taking: Reported on 04/18/2020)  . Multiple Vitamin (MULTIVITAMIN WITH MINERALS) TABS tablet Take 1 tablet by mouth daily. (Patient not taking: Reported on 04/18/2020)  . nicotine (NICODERM CQ - DOSED IN MG/24 HOURS) 21 mg/24hr patch Place 21 mg onto the skin daily.  (Patient  not taking: Reported on 04/18/2020)  . NON FORMULARY Uses a CPAP machine at night (Patient not taking: Reported on 04/26/2020)  . OXYGEN Inhale 3 L into the lungs at bedtime.  (Patient not taking: Reported on 04/26/2020)  . triamterene-hydrochlorothiazide (MAXZIDE-25) 37.5-25 MG tablet Take 1 tablet by mouth daily. (Patient not taking: Reported on 04/18/2020)   No facility-administered encounter medications on file as of 06/04/2020.     Current Diagnosis/Assessment:  Goals Addressed   None     {[facility] Diagnosis/Assessment:[phone]}

## 2020-06-11 NOTE — Progress Notes (Deleted)
Established patient visit   Patient: Eric Lawrence   DOB: 1966-08-11   54 y.o. Male  MRN: [id number] Visit Date: 06/12/2020  Today's healthcare provider: Trinna Post, PA-C   No chief complaint on file.  Subjective    HPI   Hypertension, follow-up  BP Readings from Last 3 Encounters:  05/09/20 (!) 162/100  01/20/20 (!) 199/127  12/09/19 (!) 180/100   Wt Readings from Last 3 Encounters:  05/09/20 262 lb 3.2 oz (118.9 kg)  01/20/20 262 lb (118.8 kg)  12/09/19 262 lb 12.8 oz (119.2 kg)     He was last seen for hypertension 1 months ago.  BP at that visit was 162/100. Management since that visit includes counseling on medication compliance. Patient is to continue taking amlodipine 12m, carvedilol 6.25m lisinopril 4073mand Maxzide 37.5/25mg daily.   He reports {excellent/good/fair/poor:19665} compliance with treatment. He {is/is not:9024} having side effects. {document side effects if present:1} He is following a {diet:21022986} diet. He {is/is not:9024} exercising. He {does/does not:200015} smoke.  Use of agents associated with hypertension: {bp agents assoc with hypertension:511::"none"}.   Outside blood pressures are {***enter patient reported home BP readings, or 'not being checked':1}. Symptoms: {Yes/No:20286} chest pain {Yes/No:20286} chest pressure  {Yes/No:20286} palpitations {Yes/No:20286} syncope  {Yes/No:20286} dyspnea {Yes/No:20286} orthopnea  {Yes/No:20286} paroxysmal nocturnal dyspnea {Yes/No:20286} lower extremity edema   Pertinent labs: Lab Results  Component Value Date   CHOL 179 05/09/2020   HDL 28 (L) 05/09/2020   LDLCALC 107 (H) 05/09/2020   TRIG 252 (H) 05/09/2020   CHOLHDL 6.4 (H) 05/09/2020   Lab Results  Component Value Date   NA 140 05/09/2020   K 4.3 05/09/2020   CREATININE 0.96 05/09/2020   GFRNONAA 88 05/09/2020   GFRAA 102 05/09/2020   GLUCOSE 178 (H) 05/09/2020     The 10-year ASCVD risk score (GoMikey Bussing Jr., et al.,  2013) is: 37.2%     {Show patient history (optional):23778::" "}   Medications: Outpatient Medications Prior to Visit  Medication Sig  . acetaminophen (TYLENOL) 325 MG tablet Take 325 mg by mouth daily.  (Patient not taking: Reported on 04/18/2020)  . acetaminophen (TYLENOL) 500 MG tablet Take 500 mg by mouth every 6 (six) hours as needed. (Patient not taking: Reported on 04/26/2020)  . amLODipine (NORVASC) 10 MG tablet Take 1 tablet (10 mg total) by mouth daily. (Patient not taking: Reported on 04/18/2020)  . aspirin 81 MG EC tablet Take by mouth. (Patient not taking: Reported on 04/18/2020)  . atorvastatin (LIPITOR) 10 MG tablet Take 1 tablet (10 mg total) by mouth daily. (Patient not taking: Reported on 04/18/2020)  . blood glucose meter kit and supplies Check glucose once a day.  (FOR ICD-10 E10.9, E11.9). (Patient not taking: Reported on 04/26/2020)  . buPROPion (WELLBUTRIN XL) 300 MG 24 hr tablet Take 300 mg by mouth 2 (two) times daily.  (Patient not taking: Reported on 04/18/2020)  . carvedilol (COREG) 6.25 MG tablet Take 6.25 mg by mouth 2 (two) times daily.  . Fluticasone-Salmeterol (ADVAIR DISKUS) 250-50 MCG/DOSE AEPB Inhale 1 puff into the lungs 2 (two) times daily. (Patient not taking: Reported on 04/18/2020)  . lisinopril (PRINIVIL,ZESTRIL) 40 MG tablet Take 1 tablet (40 mg total) by mouth daily. (Patient not taking: Reported on 04/18/2020)  . Multiple Vitamin (MULTIVITAMIN WITH MINERALS) TABS tablet Take 1 tablet by mouth daily. (Patient not taking: Reported on 04/18/2020)  . nicotine (NICODERM CQ - DOSED IN MG/24 HOURS) 21 mg/24hr patch Place 21  mg onto the skin daily.  (Patient not taking: Reported on 04/18/2020)  . NON FORMULARY Uses a CPAP machine at night (Patient not taking: Reported on 04/26/2020)  . OXYGEN Inhale 3 L into the lungs at bedtime.  (Patient not taking: Reported on 04/26/2020)  . triamterene-hydrochlorothiazide (MAXZIDE-25) 37.5-25 MG tablet Take 1 tablet by mouth daily.  (Patient not taking: Reported on 04/18/2020)   No facility-administered medications prior to visit.    Review of Systems  Constitutional: Negative.   Respiratory: Negative.   Cardiovascular: Negative.   Musculoskeletal: Negative.   Neurological: Negative.   Psychiatric/Behavioral: Negative.     {Heme  Chem  Endocrine  Serology  Results Review (optional):23779::" "}  Objective    There were no vitals taken for this visit. {Show previous vital signs (optional):23777::" "}  Physical Exam  ***  No results found for any visits on 06/12/20.  Assessment & Plan     ***  No follow-ups on file.      {provider attestation***:1}   Paulene Floor  [facility] [phone] (phone) [phone] (fax)  [place]

## 2020-06-12 ENCOUNTER — Ambulatory Visit: Payer: Medicare Other | Admitting: Physician Assistant

## 2020-07-02 ENCOUNTER — Telehealth: Payer: Self-pay | Admitting: *Deleted

## 2020-07-02 NOTE — Chronic Care Management (AMB) (Signed)
  Chronic Care Management   Note  07/02/2020 Name: ELIZAH MIERZWA MRN: [id number] DOB: 02/10/1966  Bennie Hind is a 54 y.o. year old male who is a primary care patient of Trey Sailors, [place]. JESHURUN OAXACA is currently enrolled in care management services. Patient called office and canceled CCM face to Face appointment with Pharmacist and does not wish to reschedule at this time due to not needing assistance with medications.   Follow up plan: Patient declines further follow up and engagement by the care management team. Appropriate care team members and provider have been notified via electronic communication. The care management team is available to follow up with the patient after provider conversation with the patient regarding recommendation for care management engagement and subsequent re-referral to the care management team.   [facility] Guide, Embedded Care Coordination [facility]  [place], [place] [address] Direct Dial: [phone] [email or web address]

## 2020-07-10 ENCOUNTER — Other Ambulatory Visit: Payer: Self-pay

## 2020-07-10 ENCOUNTER — Encounter: Payer: Self-pay | Admitting: Physician Assistant

## 2020-07-10 ENCOUNTER — Ambulatory Visit (INDEPENDENT_AMBULATORY_CARE_PROVIDER_SITE_OTHER): Payer: Medicare Other | Admitting: Physician Assistant

## 2020-07-10 VITALS — BP 158/98 | HR 83 | Temp 98.3°F | Wt 262.6 lb

## 2020-07-10 DIAGNOSIS — J410 Simple chronic bronchitis: Secondary | ICD-10-CM

## 2020-07-10 DIAGNOSIS — E119 Type 2 diabetes mellitus without complications: Secondary | ICD-10-CM | POA: Diagnosis not present

## 2020-07-10 DIAGNOSIS — Z23 Encounter for immunization: Secondary | ICD-10-CM | POA: Diagnosis not present

## 2020-07-10 DIAGNOSIS — W57XXXA Bitten or stung by nonvenomous insect and other nonvenomous arthropods, initial encounter: Secondary | ICD-10-CM

## 2020-07-10 DIAGNOSIS — F3340 Major depressive disorder, recurrent, in remission, unspecified: Secondary | ICD-10-CM

## 2020-07-10 DIAGNOSIS — E1169 Type 2 diabetes mellitus with other specified complication: Secondary | ICD-10-CM

## 2020-07-10 DIAGNOSIS — I10 Essential (primary) hypertension: Secondary | ICD-10-CM | POA: Diagnosis not present

## 2020-07-10 DIAGNOSIS — E785 Hyperlipidemia, unspecified: Secondary | ICD-10-CM

## 2020-07-10 NOTE — Progress Notes (Signed)
Established patient visit   Patient: Eric Lawrence   DOB: 08-Dec-1965   54 y.o. Male  MRN: [id number] Visit Date: 07/10/2020  Today's healthcare provider: Trinna Post, PA-C   Chief Complaint  Patient presents with  . Hypertension  I,Samanthia Howland M Raheen Capili,acting as a scribe for [employer], PA-C.,have documented all relevant documentation on the behalf of Trinna Post, PA-C,as directed by  Trinna Post, PA-C while in the presence of Trinna Post, PA-C.  Subjective    HPI  Hypertension, follow-up  BP Readings from Last 3 Encounters:  07/10/20 (!) 158/98  05/09/20 (!) 162/100  01/20/20 (!) 199/127   Wt Readings from Last 3 Encounters:  07/10/20 262 lb 9.6 oz (119.1 kg)  05/09/20 262 lb 3.2 oz (118.9 kg)  01/20/20 262 lb (118.8 kg)     He was last seen for hypertension 2 months ago.  BP at that visit was 162/100. Management since that visit includes Take amlodipine, Lisinopril, coreg and Maxzide as prescribed.  Patient states he is taking it more consistently. Cancelled appointment with clinical pharmacist. Has seen cardiology in the interim and BP was 142/92. Patient reports he missed medication yesterday morning.   He reports fair compliance with treatment. He is having side effects. Lower leg swelling per pt He is following a Regular diet. He is not exercising. He does not smoke.  Use of agents associated with hypertension: none.   Outside blood pressures are not being checked. Symptoms: No chest pain No chest pressure  No palpitations No syncope  No dyspnea No orthopnea  No paroxysmal nocturnal dyspnea Yes lower extremity edema   Pertinent labs: Lab Results  Component Value Date   CHOL 179 05/09/2020   HDL 28 (L) 05/09/2020   LDLCALC 107 (H) 05/09/2020   TRIG 252 (H) 05/09/2020   CHOLHDL 6.4 (H) 05/09/2020   Lab Results  Component Value Date   NA 140 05/09/2020   K 4.3 05/09/2020   CREATININE 0.96 05/09/2020   GFRNONAA 88 05/09/2020    GFRAA 102 05/09/2020   GLUCOSE 178 (H) 05/09/2020     The 10-year ASCVD risk score Mikey Bussing DC Jr., et al., 2013) is: 43%   Lipid/Cholesterol, Follow-up  Last lipid panel Other pertinent labs  Lab Results  Component Value Date   CHOL 179 05/09/2020   HDL 28 (L) 05/09/2020   LDLCALC 107 (H) 05/09/2020   TRIG 252 (H) 05/09/2020   CHOLHDL 6.4 (H) 05/09/2020   Lab Results  Component Value Date   ALT 26 05/09/2020   AST 16 05/09/2020   PLT 157 05/09/2020   TSH 1.400 05/09/2020     He was last seen for this 3 months ago.  Management since that visit includes continue statin.  He reports fair compliance with treatment. He is not having side effects.   Symptoms: No chest pain No chest pressure/discomfort  No dyspnea No lower extremity edema  No numbness or tingling of extremity No orthopnea  No palpitations No paroxysmal nocturnal dyspnea  No speech difficulty No syncope   Current diet: in general, an "unhealthy" diet Current exercise: none  The 10-year ASCVD risk score Mikey Bussing DC Jr., et al., 2013) is: 43%  ---------------------------------------------------------------------------------------------------  Diabetes Mellitus Type II, Follow-up  Lab Results  Component Value Date   HGBA1C 7.3 (H) 05/09/2020   HGBA1C 6.4 (A) 11/08/2019   HGBA1C 6.4 (A) 07/22/2019   Wt Readings from Last 3 Encounters:  07/10/20 262 lb 9.6 oz (119.1  kg)  05/09/20 262 lb 3.2 oz (118.9 kg)  01/20/20 262 lb (118.8 kg)   Last seen for diabetes 3 months ago.  Management since then includes diet controlled. Patient eating poorly, frequently drinking sweet tea and sodas.   No fatigue No foot ulcerations  No appetite changes No nausea  No paresthesia of the feet  No polydipsia  No polyuria No visual disturbances   No vomiting     Pertinent Labs: Lab Results  Component Value Date   CHOL 179 05/09/2020   HDL 28 (L) 05/09/2020   LDLCALC 107 (H) 05/09/2020   TRIG 252 (H) 05/09/2020    CHOLHDL 6.4 (H) 05/09/2020   Lab Results  Component Value Date   NA 140 05/09/2020   K 4.3 05/09/2020   CREATININE 0.96 05/09/2020   GFRNONAA 88 05/09/2020   GFRAA 102 05/09/2020   GLUCOSE 178 (H) 05/09/2020     ---------------------------------------------------------------------------------------------------  COPD, Follow up  He was last seen for this 6 months ago. Changes made include discontinue inhaler as patient wasn't taking it. Patient continues to smoke.    He IS experiencing cough. He is NOT experiencing dyspnea, wheezing, increased sputum or colored sputum. he reports breathing is Unchanged.  Pulmonary Functions Testing Results:  No results found for: FEV1, FVC, FEV1FVC, TLC  ----------------------------------------------------------------------------------------- Mentions bugbite on th eback of his left calf. A red ant bit him and he reports it is swollen.   ---------------------------------------------------------------------------------------------------      Medications: Outpatient Medications Prior to Visit  Medication Sig  . acetaminophen (TYLENOL) 500 MG tablet Take 500 mg by mouth every 6 (six) hours as needed.   Marland Kitchen amLODipine (NORVASC) 10 MG tablet Take 1 tablet (10 mg total) by mouth daily.  Marland Kitchen aspirin 81 MG EC tablet Take by mouth.   Marland Kitchen atorvastatin (LIPITOR) 10 MG tablet Take 1 tablet (10 mg total) by mouth daily.  . blood glucose meter kit and supplies Check glucose once a day.  (FOR ICD-10 E10.9, E11.9).  Marland Kitchen buPROPion (WELLBUTRIN XL) 300 MG 24 hr tablet Take 300 mg by mouth 2 (two) times daily.   . carvedilol (COREG) 6.25 MG tablet Take 6.25 mg by mouth 2 (two) times daily.  . Fluticasone-Salmeterol (ADVAIR DISKUS) 250-50 MCG/DOSE AEPB Inhale 1 puff into the lungs 2 (two) times daily.  Marland Kitchen lisinopril (PRINIVIL,ZESTRIL) 40 MG tablet Take 1 tablet (40 mg total) by mouth daily.  . Multiple Vitamin (MULTIVITAMIN WITH MINERALS) TABS tablet Take 1 tablet by  mouth daily.   . nicotine (NICODERM CQ - DOSED IN MG/24 HOURS) 21 mg/24hr patch Place 21 mg onto the skin daily.   . NON FORMULARY Uses a CPAP machine at night   . OXYGEN Inhale 3 L into the lungs at bedtime.   . triamterene-hydrochlorothiazide (MAXZIDE-25) 37.5-25 MG tablet Take 1 tablet by mouth daily.  Marland Kitchen acetaminophen (TYLENOL) 325 MG tablet Take 325 mg by mouth daily.  (Patient not taking: Reported on 04/18/2020)   No facility-administered medications prior to visit.    Review of Systems  Constitutional: Negative.   Respiratory: Negative.   Cardiovascular: Positive for leg swelling.  Hematological: Negative.       Objective    BP (!) 158/98 (BP Location: Left Arm, Patient Position: Sitting, Cuff Size: Normal)   Pulse 83   Temp 98.3 F (36.8 C) (Oral)   Wt 262 lb 9.6 oz (119.1 kg)   SpO2 97%   BMI 33.72 kg/m    Physical Exam Constitutional:  Appearance: Normal appearance.  Cardiovascular:     Rate and Rhythm: Normal rate and regular rhythm.     Heart sounds: Normal heart sounds.  Pulmonary:     Effort: Pulmonary effort is normal.     Breath sounds: Wheezing present.  Musculoskeletal:       Legs:  Skin:    General: Skin is warm and dry.  Neurological:     Mental Status: He is alert and oriented to person, place, and time. Mental status is at baseline.  Psychiatric:        Mood and Affect: Mood normal.        Behavior: Behavior normal.       No results found for any visits on 07/10/20.  Assessment & Plan    1. Hyperlipidemia associated with type 2 diabetes mellitus (HCC)  Continue statin. Labs at next visit.   2. Diabetes mellitus without complication ([place])  Counseled on diet. Labs at next visit.   3. Essential hypertension  Uncontrolled though better at last cardiologist. Encouraged medication compliance.   4. Simple chronic bronchitis ([place])  Patient won't take inhalers. Counseled on smoking cessation, patient declines.   5. Bug Bite,  initial encounter  Advise patient to observe.   6. Affective Disorder  Followed up with Dr. Carlos Levering Su and was started on wellbutrin.   Return in about 3 months (around 10/09/2020) for diabetes, HLD .      ITrinna Post, PA-C, have reviewed all documentation for this visit. The documentation on 07/10/20 for the exam, diagnosis, procedures, and orders are all accurate and complete.  The entirety of the information documented in the History of Present Illness, Review of Systems and Physical Exam were personally obtained by me. Portions of this information were initially documented by [facility] and reviewed by me for thoroughness and accuracy.     Paulene Floor  [facility] [phone] (phone) [phone] (fax)  [place]

## 2020-07-10 NOTE — Addendum Note (Signed)
Addended by: Margo Common on: 07/10/2020 09:32 AM   Modules accepted: Orders

## 2020-07-20 ENCOUNTER — Encounter: Payer: Self-pay | Admitting: Physician Assistant

## 2020-07-20 ENCOUNTER — Telehealth: Payer: Self-pay | Admitting: *Deleted

## 2020-07-20 ENCOUNTER — Ambulatory Visit (INDEPENDENT_AMBULATORY_CARE_PROVIDER_SITE_OTHER): Payer: Medicare Other | Admitting: Physician Assistant

## 2020-07-20 ENCOUNTER — Other Ambulatory Visit: Payer: Self-pay

## 2020-07-20 VITALS — BP 198/117 | HR 78 | Temp 98.6°F | Ht 74.0 in | Wt 260.0 lb

## 2020-07-20 DIAGNOSIS — S91319A Laceration without foreign body, unspecified foot, initial encounter: Secondary | ICD-10-CM | POA: Diagnosis not present

## 2020-07-20 DIAGNOSIS — E118 Type 2 diabetes mellitus with unspecified complications: Secondary | ICD-10-CM

## 2020-07-20 DIAGNOSIS — E785 Hyperlipidemia, unspecified: Secondary | ICD-10-CM

## 2020-07-20 DIAGNOSIS — I10 Essential (primary) hypertension: Secondary | ICD-10-CM | POA: Diagnosis not present

## 2020-07-20 DIAGNOSIS — Z23 Encounter for immunization: Secondary | ICD-10-CM

## 2020-07-20 DIAGNOSIS — E1169 Type 2 diabetes mellitus with other specified complication: Secondary | ICD-10-CM

## 2020-07-20 MED ORDER — METFORMIN HCL ER 500 MG PO TB24: 500.0000 mg | ORAL_TABLET | Freq: Two times a day (BID) | ORAL | 0 refills | Status: DC

## 2020-07-20 NOTE — Progress Notes (Signed)
Established patient visit   Patient: Eric Lawrence   DOB: [birth year]   54 y.o. Male  MRN: [id number] Visit Date: 07/20/2020  Today's healthcare provider: Trinna Post, PA-C   Chief Complaint  Patient presents with  . Diabetes   Subjective    HPI  Diabetes Mellitus Type II, Follow-up  Lab Results  Component Value Date   HGBA1C 7.3 (H) 05/09/2020   HGBA1C 6.4 (A) 11/08/2019   HGBA1C 6.4 (A) 07/22/2019   Wt Readings from Last 3 Encounters:  07/20/20 260 lb (117.9 kg)  07/10/20 262 lb 9.6 oz (119.1 kg)  05/09/20 262 lb 3.2 oz (118.9 kg)   Last seen for diabetes 1 weeks ago.  Management since then includes no changes. He reports fair compliance with treatment. He is not having side effects.  Symptoms: No fatigue No foot ulcerations  No appetite changes No nausea  No paresthesia of the feet  No polydipsia  No polyuria No visual disturbances   No vomiting     Home blood sugar records: fasting range: 250s  Episodes of hypoglycemia? No    Current insulin regiment: none Most Recent Eye Exam: up to date 01/2020.  Current exercise: none Current diet habits: in general, an "unhealthy" diet  Pertinent Labs: Lab Results  Component Value Date   CHOL 179 05/09/2020   HDL 28 (L) 05/09/2020   LDLCALC 107 (H) 05/09/2020   TRIG 252 (H) 05/09/2020   CHOLHDL 6.4 (H) 05/09/2020   Lab Results  Component Value Date   NA 140 05/09/2020   K 4.3 05/09/2020   CREATININE 0.96 05/09/2020   GFRNONAA 88 05/09/2020   GFRAA 102 05/09/2020   GLUCOSE 178 (H) 05/09/2020     Hypertension, follow-up  BP Readings from Last 3 Encounters:  07/20/20 (!) 198/117  07/10/20 (!) 158/98  05/09/20 (!) 162/100   Wt Readings from Last 3 Encounters:  07/20/20 260 lb (117.9 kg)  07/10/20 262 lb 9.6 oz (119.1 kg)  05/09/20 262 lb 3.2 oz (118.9 kg)     He was last seen for hypertension 1 weeks ago.  BP at that visit was 158/98. Management since that visit includes start being  compliant with medications.  He reports poor compliance with treatment. Does not take medications regularly. Is not overly concerned about his blood pressure. He is not having side effects.  He is following a Regular diet. He is not exercising. He does smoke.  Use of agents associated with hypertension: none.   Outside blood pressures are not being checked. Symptoms: No chest pain No chest pressure  No palpitations No syncope  No dyspnea No orthopnea  No paroxysmal nocturnal dyspnea No lower extremity edema   Pertinent labs: Lab Results  Component Value Date   CHOL 179 05/09/2020   HDL 28 (L) 05/09/2020   LDLCALC 107 (H) 05/09/2020   TRIG 252 (H) 05/09/2020   CHOLHDL 6.4 (H) 05/09/2020   Lab Results  Component Value Date   NA 140 05/09/2020   K 4.3 05/09/2020   CREATININE 0.96 05/09/2020   GFRNONAA 88 05/09/2020   GFRAA 102 05/09/2020   GLUCOSE 178 (H) 05/09/2020     The 10-year ASCVD risk score Mikey Bussing DC Jr., et al., 2013) is: 57%   Lipid/Cholesterol, Follow-up  Last lipid panel Other pertinent labs  Lab Results  Component Value Date   CHOL 179 05/09/2020   HDL 28 (L) 05/09/2020   LDLCALC 107 (H) 05/09/2020   TRIG 252 (H) 05/09/2020  CHOLHDL 6.4 (H) 05/09/2020   Lab Results  Component Value Date   ALT 26 05/09/2020   AST 16 05/09/2020   PLT 157 05/09/2020   TSH 1.400 05/09/2020     He was last seen for this 3 months ago.  Management since that visit includes continue statin.  He reports excellent compliance with treatment. He is not having side effects.   Symptoms: No chest pain No chest pressure/discomfort  No dyspnea No lower extremity edema  No numbness or tingling of extremity No orthopnea  No palpitations No paroxysmal nocturnal dyspnea  No speech difficulty No syncope   Current diet: not asked Current exercise: none  The 10-year ASCVD risk score Mikey Bussing DC Jr., et al., 2013) is:  57%  ---------------------------------------------------------------------------------------------------  ---------------------------------------------------------------------------------------------------     Medications: Outpatient Medications Prior to Visit  Medication Sig  . acetaminophen (TYLENOL) 500 MG tablet Take 500 mg by mouth every 6 (six) hours as needed.   Marland Kitchen amLODipine (NORVASC) 10 MG tablet Take 1 tablet (10 mg total) by mouth daily.  Marland Kitchen aspirin 81 MG EC tablet Take by mouth.   Marland Kitchen atorvastatin (LIPITOR) 10 MG tablet Take 1 tablet (10 mg total) by mouth daily.  . blood glucose meter kit and supplies Check glucose once a day.  (FOR ICD-10 E10.9, E11.9).  Marland Kitchen buPROPion (WELLBUTRIN XL) 300 MG 24 hr tablet Take 300 mg by mouth 2 (two) times daily.   . carvedilol (COREG) 6.25 MG tablet Take 6.25 mg by mouth 2 (two) times daily.  . Fluticasone-Salmeterol (ADVAIR DISKUS) 250-50 MCG/DOSE AEPB Inhale 1 puff into the lungs 2 (two) times daily.  Marland Kitchen lisinopril (PRINIVIL,ZESTRIL) 40 MG tablet Take 1 tablet (40 mg total) by mouth daily.  . Multiple Vitamin (MULTIVITAMIN WITH MINERALS) TABS tablet Take 1 tablet by mouth daily.   . nicotine (NICODERM CQ - DOSED IN MG/24 HOURS) 21 mg/24hr patch Place 21 mg onto the skin daily.   . NON FORMULARY Uses a CPAP machine at night   . OXYGEN Inhale 3 L into the lungs at bedtime.   . triamterene-hydrochlorothiazide (MAXZIDE-25) 37.5-25 MG tablet Take 1 tablet by mouth daily.  Marland Kitchen acetaminophen (TYLENOL) 325 MG tablet Take 325 mg by mouth daily.  (Patient not taking: Reported on 04/18/2020)   No facility-administered medications prior to visit.    Review of Systems    Objective    BP (!) 198/117   Pulse 78   Temp 98.6 F (37 C)   Ht '6\' 2"'  (1.88 m)   Wt 260 lb (117.9 kg)   BMI 33.38 kg/m    Physical Exam Constitutional:      Appearance: Normal appearance.  Cardiovascular:     Rate and Rhythm: Normal rate and regular rhythm.     Heart  sounds: Normal heart sounds.  Pulmonary:     Effort: Pulmonary effort is normal.     Breath sounds: Normal breath sounds.  Skin:    General: Skin is warm and dry.  Neurological:     Mental Status: He is alert and oriented to person, place, and time. Mental status is at baseline.  Psychiatric:        Mood and Affect: Mood normal.        Behavior: Behavior normal.       No results found for any visits on 07/20/20.  Assessment & Plan    1. Controlled type 2 diabetes mellitus with complication, without long-term current use of insulin (HCC)  Trending upwards. Refer for diabetes  education. Counseled on avoiding sugary/starchy good. Follow up in one month.  - metFORMIN (GLUCOPHAGE XR) 500 MG 24 hr tablet; Take 1 tablet (500 mg total) by mouth in the morning and at bedtime.  Dispense: 180 tablet; Refill: 0 - Ambulatory referral to Chronic Care Management Services  2. Hyperlipidemia associated with type 2 diabetes mellitus (HCC)  Continue statin.   3. Essential hypertension  Uncontrolled, he is not compliant with medication. Counseled he will likely have a stroke/heart attack.    4. Cut of foot  Update tetanus shot today.    Return in about 4 weeks (around 08/17/2020) for DM.      ITrinna Post, PA-C, have reviewed all documentation for this visit. The documentation on 07/20/20 for the exam, diagnosis, procedures, and orders are all accurate and complete.    Paulene Floor  [facility] [phone] (phone) [phone] (fax)  [place]

## 2020-07-20 NOTE — Chronic Care Management (AMB) (Signed)
  Chronic Care Management   Outreach Note  07/20/2020 Name: Eric Lawrence MRN: [id number] DOB: May 10, 1966  Eric Lawrence is a 54 y.o. year old male who is a primary care patient of Trey Sailors, [place]. I reached out to Eric Lawrence by phone today in response to a referral sent by Eric Lawrence PCP, Trey Sailors, PA-C.     A telephone outreach was attempted today spoke to patient he wants in person visit ASAP sent message to RN CM to advise, will call patient back. The patient was referred to the case management team for assistance with care management and care coordination.   Follow Up Plan: The care management team will reach out to the patient again over the next 7 days.  If patient returns call to provider office, please advise to call Embedded Care Management Care Guide Gwenevere Ghazi at [phone].  Gwenevere Ghazi  Care Guide, Embedded Care Coordination [facility] Management

## 2020-07-20 NOTE — Patient Instructions (Signed)
Diabetes Mellitus and Exercise Exercising regularly is important for your overall health, especially when you have diabetes (diabetes mellitus). Exercising is not only about losing weight. It has many other health benefits, such as increasing muscle strength and bone density and reducing body fat and stress. This leads to improved fitness, flexibility, and endurance, all of which result in better overall health. Exercise has additional benefits for people with diabetes, including:  Reducing appetite.  Helping to lower and control blood glucose.  Lowering blood pressure.  Helping to control amounts of fatty substances (lipids) in the blood, such as cholesterol and triglycerides.  Helping the body to respond better to insulin (improving insulin sensitivity).  Reducing how much insulin the body needs.  Decreasing the risk for heart disease by: ? Lowering cholesterol and triglyceride levels. ? Increasing the levels of good cholesterol. ? Lowering blood glucose levels. What is my activity plan? Your health care provider or certified diabetes educator can help you make a plan for the type and frequency of exercise (activity plan) that works for you. Make sure that you:  Do at least 150 minutes of moderate-intensity or vigorous-intensity exercise each week. This could be brisk walking, biking, or water aerobics. ? Do stretching and strength exercises, such as yoga or weightlifting, at least 2 times a week. ? Spread out your activity over at least 3 days of the week.  Get some form of physical activity every day. ? Do not go more than 2 days in a row without some kind of physical activity. ? Avoid being inactive for more than 30 minutes at a time. Take frequent breaks to walk or stretch.  Choose a type of exercise or activity that you enjoy, and set realistic goals.  Start slowly, and gradually increase the intensity of your exercise over time. What do I need to know about managing my  diabetes?   Check your blood glucose before and after exercising. ? If your blood glucose is 240 mg/dL (13.3 mmol/L) or higher before you exercise, check your urine for ketones. If you have ketones in your urine, do not exercise until your blood glucose returns to normal. ? If your blood glucose is 100 mg/dL (5.6 mmol/L) or lower, eat a snack containing 15-20 grams of carbohydrate. Check your blood glucose 15 minutes after the snack to make sure that your level is above 100 mg/dL (5.6 mmol/L) before you start your exercise.  Know the symptoms of low blood glucose (hypoglycemia) and how to treat it. Your risk for hypoglycemia increases during and after exercise. Common symptoms of hypoglycemia can include: ? Hunger. ? Anxiety. ? Sweating and feeling clammy. ? Confusion. ? Dizziness or feeling light-headed. ? Increased heart rate or palpitations. ? Blurry vision. ? Tingling or numbness around the mouth, lips, or tongue. ? Tremors or shakes. ? Irritability.  Keep a rapid-acting carbohydrate snack available before, during, and after exercise to help prevent or treat hypoglycemia.  Avoid injecting insulin into areas of the body that are going to be exercised. For example, avoid injecting insulin into: ? The arms, when playing tennis. ? The legs, when jogging.  Keep records of your exercise habits. Doing this can help you and your health care provider adjust your diabetes management plan as needed. Write down: ? Food that you eat before and after you exercise. ? Blood glucose levels before and after you exercise. ? The type and amount of exercise you have done. ? When your insulin is expected to peak, if you use   insulin. Avoid exercising at times when your insulin is peaking.  When you start a new exercise or activity, work with your health care provider to make sure the activity is safe for you, and to adjust your insulin, medicines, or food intake as needed.  Drink plenty of water while  you exercise to prevent dehydration or heat stroke. Drink enough fluid to keep your urine clear or pale yellow. Summary  Exercising regularly is important for your overall health, especially when you have diabetes (diabetes mellitus).  Exercising has many health benefits, such as increasing muscle strength and bone density and reducing body fat and stress.  Your health care provider or certified diabetes educator can help you make a plan for the type and frequency of exercise (activity plan) that works for you.  When you start a new exercise or activity, work with your health care provider to make sure the activity is safe for you, and to adjust your insulin, medicines, or food intake as needed. This information is not intended to replace advice given to you by your health care provider. Make sure you discuss any questions you have with your health care provider. Document Revised: 05/14/2017 Document Reviewed: 03/31/2016 Elsevier Patient Education  2020 Elsevier Inc.  

## 2020-07-20 NOTE — Chronic Care Management (AMB) (Signed)
Chronic Care Management   Note  07/20/2020 Name: Eric Lawrence MRN: [id number] DOB: 1966/01/20  Eric Lawrence is a 54 y.o. year old male who is a primary care patient of Trinna Post, [place]. I reached out to Eric Lawrence by phone today in response to a referral sent by Mr. RANCE SMITHSON PCP, Trinna Post, PA-C.     [name] was given information about Chronic Care Management services today including:  1. CCM service includes personalized support from designated clinical staff supervised by his physician, including individualized plan of care and coordination with other care providers 2. 24/7 contact phone numbers for assistance for urgent and routine care needs. 3. Service will only be billed when office clinical staff spend 20 minutes or more in a month to coordinate care. 4. Only one practitioner may furnish and bill the service in a calendar month. 5. The patient may stop CCM services at any time (effective at the end of the month) by phone call to the office staff. 6. The patient will be responsible for cost sharing (co-pay) of up to 20% of the service fee (after annual deductible is met).  Patient agreed to services and verbal consent obtained.   Follow up plan: Telephone appointment with care management team member scheduled for:07/23/2020  [place] Management

## 2020-07-23 ENCOUNTER — Telehealth: Payer: Self-pay

## 2020-07-23 NOTE — Telephone Encounter (Signed)
  Chronic Care Management   Outreach Note  07/23/2020 Name: Eric Lawrence MRN: [id number] DOB: 1966-04-12  Primary Care Provider: Trey Sailors, PA-C Reason for referral : Chronic Care Management   An unsuccessful telephone outreach was attempted today. [name] was referred to the case management team for assistance with care management and care coordination.   A HIPAA compliant voice message was left today requesting a return call.    PLAN: A member of the care management team will reach out to [name] again within the next two weeks.   [facility] Care Management [facility] [phone]

## 2020-07-24 ENCOUNTER — Ambulatory Visit: Payer: Self-pay

## 2020-07-24 NOTE — Chronic Care Management (AMB) (Signed)
  Chronic Care Management   Note  07/24/2020 Name: QUAMERE MUSSELL MRN: [id number] DOB: 23-Apr-1966  Care Coordination: Documents and Resources mailed.     PLAN A member of the care management team will reach out to Mr. Rumbold within the next two weeks.    [facility] Care Management [facility] [phone]

## 2020-07-30 IMAGING — CR DG CHEST 1V
1 series · 1 of 1 positions shown · non-contrast
Comparison: October 13, 2018

CLINICAL DATA: Pacemaker dysfunction.

EXAM:
CHEST  1 VIEW

[chest pa]
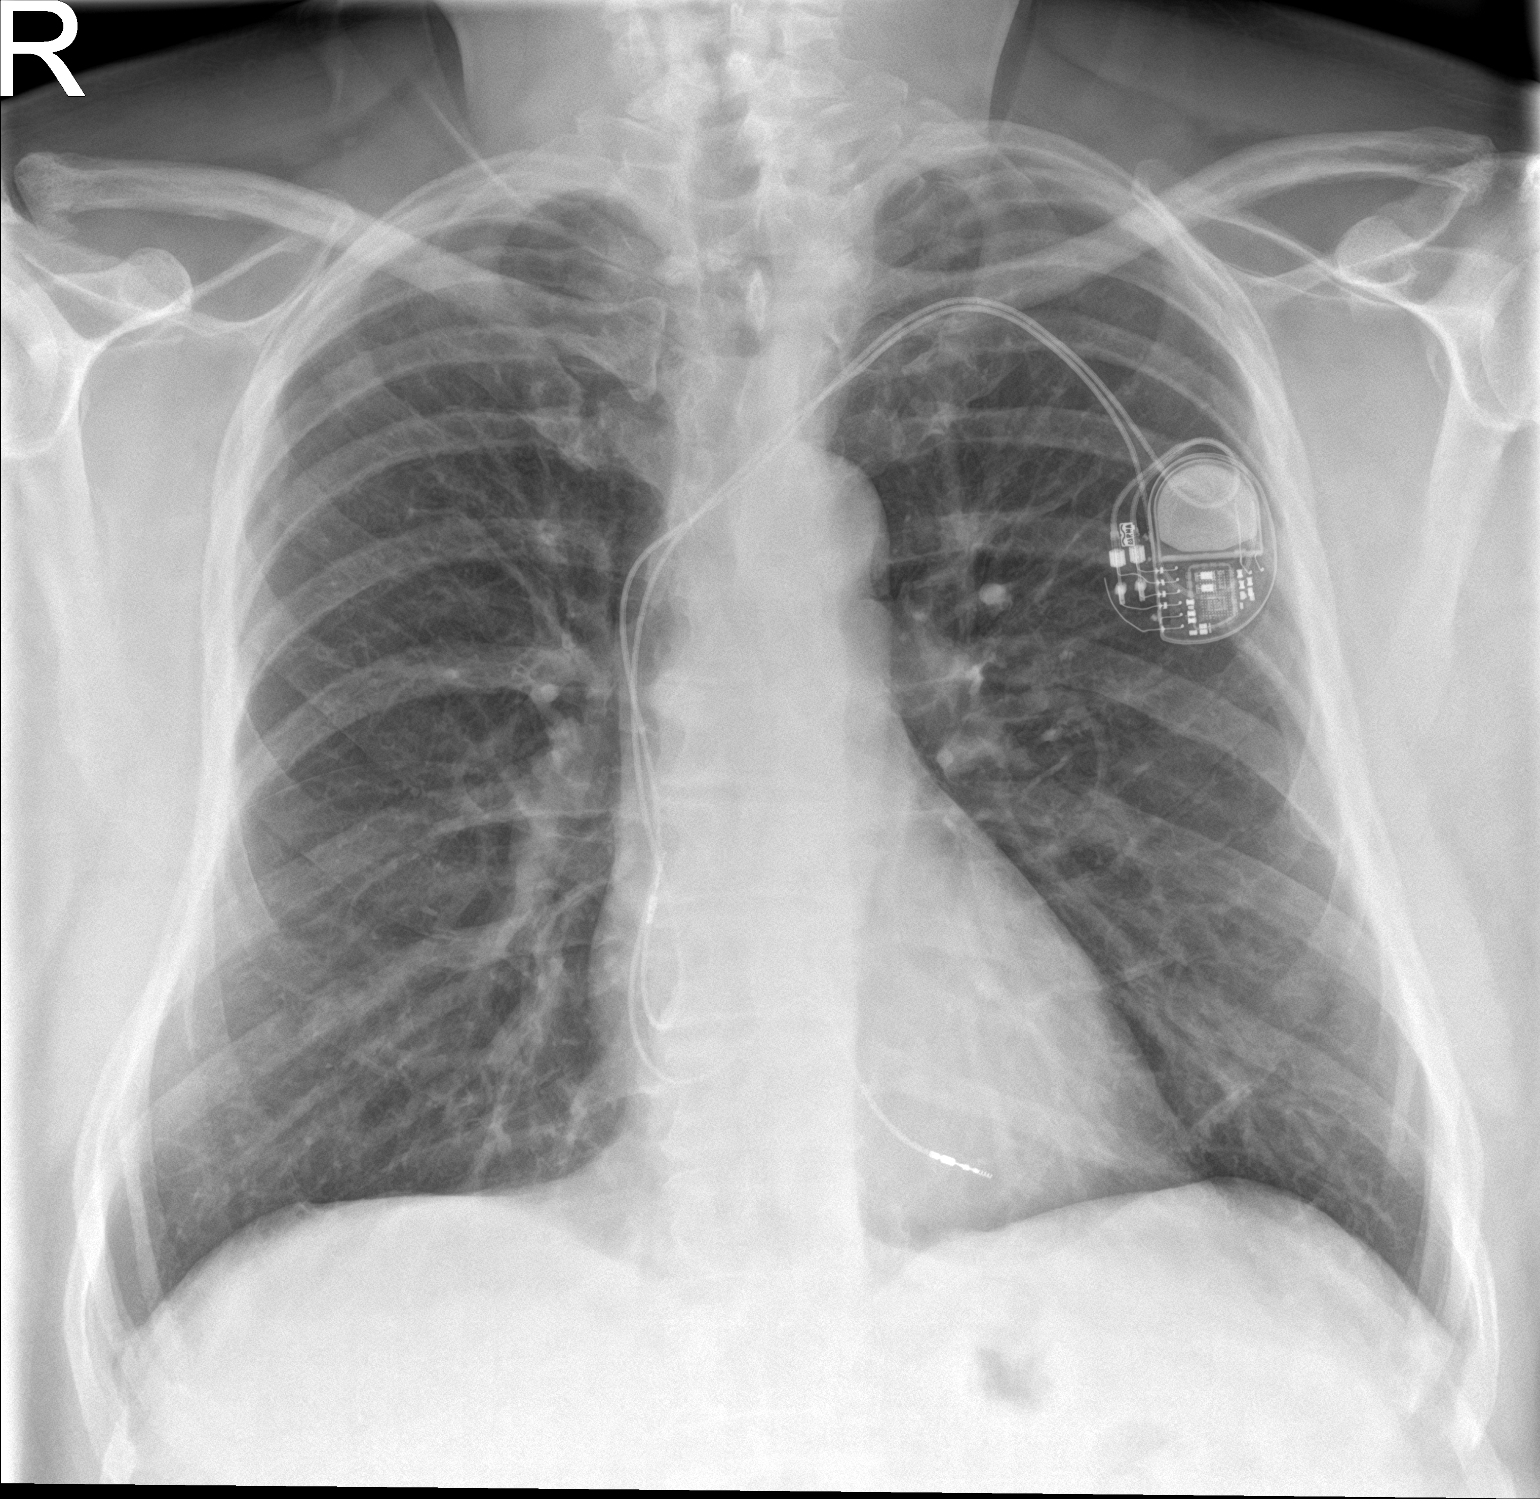

[1 of 1 positions shown; findings below may reference images not displayed]

FINDINGS: Pacemaker is stable in appearance. Leads appear to be intact. No
pneumothorax. No nodules or masses. No focal infiltrates. No overt
edema. The cardiomediastinal silhouette is normal.
IMPRESSION: No cause for the patient's symptoms identified. No acute
abnormalities.

## 2020-08-14 ENCOUNTER — Telehealth: Payer: Self-pay

## 2020-08-14 NOTE — Telephone Encounter (Signed)
°  Chronic Care Management   Outreach Note  08/14/2020 Name: [name] MRN: [id number] DOB: [birth year]  Primary Care Provider: Trey Sailors, PA-C Reason for referral : Chronic Care Management   An unsuccessful telephone outreach was attempted today. [name] was referred to the case management team for assistance with care management and care coordination.     PLAN A member of the care management team will reach out to [name] again within the next two weeks.    [facility] Care Management [facility] [phone]

## 2020-08-21 ENCOUNTER — Other Ambulatory Visit: Payer: Self-pay

## 2020-08-21 ENCOUNTER — Encounter: Payer: Self-pay | Admitting: Physician Assistant

## 2020-08-21 ENCOUNTER — Ambulatory Visit: Payer: Medicare Other | Admitting: Physician Assistant

## 2020-08-21 ENCOUNTER — Ambulatory Visit (INDEPENDENT_AMBULATORY_CARE_PROVIDER_SITE_OTHER): Payer: Medicare Other | Admitting: Physician Assistant

## 2020-08-21 VITALS — BP 174/103 | HR 82 | Temp 98.1°F | Ht 74.0 in | Wt 254.4 lb

## 2020-08-21 DIAGNOSIS — Z716 Tobacco abuse counseling: Secondary | ICD-10-CM

## 2020-08-21 DIAGNOSIS — I10 Essential (primary) hypertension: Secondary | ICD-10-CM

## 2020-08-21 DIAGNOSIS — E785 Hyperlipidemia, unspecified: Secondary | ICD-10-CM | POA: Diagnosis not present

## 2020-08-21 DIAGNOSIS — E119 Type 2 diabetes mellitus without complications: Secondary | ICD-10-CM

## 2020-08-21 DIAGNOSIS — F419 Anxiety disorder, unspecified: Secondary | ICD-10-CM

## 2020-08-21 DIAGNOSIS — J410 Simple chronic bronchitis: Secondary | ICD-10-CM | POA: Diagnosis not present

## 2020-08-21 MED ORDER — NICOTINE 21 MG/24HR TD PT24: 21.0000 mg | MEDICATED_PATCH | Freq: Every day | TRANSDERMAL | 0 refills | Status: AC

## 2020-08-21 MED ORDER — AMLODIPINE BESYLATE 10 MG PO TABS: 10.0000 mg | ORAL_TABLET | Freq: Every day | ORAL | 0 refills | Status: DC

## 2020-08-21 NOTE — Progress Notes (Signed)
Established patient visit   Patient: Eric Lawrence   DOB: 07-Sep-1966   54 y.o. Male  MRN: [id number] Visit Date: 08/21/2020  Today's healthcare provider: Trinna Post, PA-C   Chief Complaint  Patient presents with  . Diabetes   Subjective    HPI  Diabetes Mellitus Type II, Follow-up  Lab Results  Component Value Date   HGBA1C 7.3 (H) 05/09/2020   HGBA1C 6.4 (A) 11/08/2019   HGBA1C 6.4 (A) 07/22/2019   Wt Readings from Last 3 Encounters:  07/20/20 260 lb (117.9 kg)  07/10/20 262 lb 9.6 oz (119.1 kg)  05/09/20 262 lb 3.2 oz (118.9 kg)   Last seen for diabetes 1 months ago.  Management since then includes referred for diabetes education. Counseled on avoiding sugary/starchy good. He reports good compliance with treatment. He is not having side effects.  Symptoms: No fatigue No foot ulcerations  No appetite changes No nausea  No paresthesia of the feet  No polydipsia  No polyuria No visual disturbances   No vomiting     Home blood sugar records: fasting range: 138-174   Episodes of hypoglycemia? No    Current insulin regiment: None Most Recent Eye Exam: 01/11/2020 Current exercise: none Current diet habits: in general, a "healthy" diet    Pertinent Labs: Lab Results  Component Value Date   CHOL 179 05/09/2020   HDL 28 (L) 05/09/2020   LDLCALC 107 (H) 05/09/2020   TRIG 252 (H) 05/09/2020   CHOLHDL 6.4 (H) 05/09/2020   Lab Results  Component Value Date   NA 140 05/09/2020   K 4.3 05/09/2020   CREATININE 0.96 05/09/2020   GFRNONAA 88 05/09/2020   GFRAA 102 05/09/2020   GLUCOSE 178 (H) 05/09/2020     ---------------------------------------------------------------------------------------------------   Anxiety, Follow-up  He was last seen for anxiety 6 months ago. He reports he has had low motivation for life because he has no family left. Recently he has found joy in taking care of his roomates 30 year old son and reports increased motivations  related to caring for him, picking him from school etc. He says he missed his recent appointment with [facility] in [place] because he forgot to put the appointment in his phone.  Changes made at last visit include continue follow up with psychiatry and taking wellbutrin.   He reports poor compliance with treatment. He reports good tolerance of treatment. He is not having side effects.   He feels his anxiety is mild and Unchanged since last visit.  Symptoms: No chest pain No difficulty concentrating  No dizziness No fatigue  No feelings of losing control No insomnia  No irritable No palpitations  No panic attacks No racing thoughts  No shortness of breath No sweating  No tremors/shakes    GAD-7 Results GAD-7 Generalized Anxiety Disorder Screening Tool 04/15/2017  1. Feeling Nervous, Anxious, or on Edge 3  2. Not Being Able to Stop or Control Worrying 3  3. Worrying Too Much About Different Things 3  4. Trouble Relaxing 2  5. Being So Restless it's Hard To Sit Still 2  6. Becoming Easily Annoyed or Irritable 2  7. Feeling Afraid As If Something Awful Might Happen 2  Total GAD-7 Score 17  Difficulty At Work, Home, or Getting  Along With Others? Somewhat difficult    PHQ-9 Scores PHQ9 SCORE ONLY 08/21/2020 04/18/2020 11/08/2019  PHQ-9 Total Score 0 1 0    ---------------------------------------------------------------------------------------------------  Hypertension, follow-up  BP Readings  from Last 3 Encounters:  08/21/20 (!) 174/103  07/20/20 (!) 198/117  07/10/20 (!) 158/98   Wt Readings from Last 3 Encounters:  08/21/20 254 lb 6.4 oz (115.4 kg)  07/20/20 260 lb (117.9 kg)  07/10/20 262 lb 9.6 oz (119.1 kg)     He was last seen for hypertension 1 months ago.  BP at that visit was elevated. Management since that visit includes encouraging compliance with all medications.  He reports fair compliance with treatment. Patient reports he started taking  his medications more consistently. He is not having side effects.  He is following a Regular diet. He is not exercising. He does smoke.  Use of agents associated with hypertension: none.   Outside blood pressures are not checked. Symptoms: No chest pain No chest pressure  No palpitations No syncope  No dyspnea No orthopnea  No paroxysmal nocturnal dyspnea No lower extremity edema   Pertinent labs: Lab Results  Component Value Date   CHOL 179 05/09/2020   HDL 28 (L) 05/09/2020   LDLCALC 107 (H) 05/09/2020   TRIG 252 (H) 05/09/2020   CHOLHDL 6.4 (H) 05/09/2020   Lab Results  Component Value Date   NA 140 05/09/2020   K 4.3 05/09/2020   CREATININE 0.96 05/09/2020   GFRNONAA 88 05/09/2020   GFRAA 102 05/09/2020   GLUCOSE 178 (H) 05/09/2020     The 10-year ASCVD risk score Mikey Bussing DC Jr., et al., 2013) is: 48.8%   ---------------------------------------------------------------------------------------------------  Lipid/Cholesterol, Follow-up  Last lipid panel Other pertinent labs  Lab Results  Component Value Date   CHOL 179 05/09/2020   HDL 28 (L) 05/09/2020   LDLCALC 107 (H) 05/09/2020   TRIG 252 (H) 05/09/2020   CHOLHDL 6.4 (H) 05/09/2020   Lab Results  Component Value Date   ALT 26 05/09/2020   AST 16 05/09/2020   PLT 157 05/09/2020   TSH 1.400 05/09/2020     He was last seen for this 1 months ago.  Management since that visit includes continue statin, encouraged compliance.  He reports fair compliance with treatment. He is not having side effects.   Symptoms: No chest pain No chest pressure/discomfort  No dyspnea No lower extremity edema  No numbness or tingling of extremity No orthopnea  No palpitations No paroxysmal nocturnal dyspnea  No speech difficulty No syncope   Current diet: not asked Current exercise: none  The 10-year ASCVD risk score Mikey Bussing DC Jr., et al., 2013) is:  48.8%  ---------------------------------------------------------------------------------------------------  Tobacco Abuse: Patient reports he must quit smoking by the end of the year or his life insurance policy will be cancelled. He would like to try patches for this.   Medications: Outpatient Medications Prior to Visit  Medication Sig  . acetaminophen (TYLENOL) 500 MG tablet Take 500 mg by mouth every 6 (six) hours as needed.   Marland Kitchen aspirin 81 MG EC tablet Take by mouth.   Marland Kitchen atorvastatin (LIPITOR) 10 MG tablet Take 1 tablet (10 mg total) by mouth daily.  . blood glucose meter kit and supplies Check glucose once a day.  (FOR ICD-10 E10.9, E11.9).  Marland Kitchen buPROPion (WELLBUTRIN XL) 300 MG 24 hr tablet Take 300 mg by mouth 2 (two) times daily.   . carvedilol (COREG) 6.25 MG tablet Take 6.25 mg by mouth 2 (two) times daily.  . Fluticasone-Salmeterol (ADVAIR DISKUS) 250-50 MCG/DOSE AEPB Inhale 1 puff into the lungs 2 (two) times daily.  Marland Kitchen lisinopril (PRINIVIL,ZESTRIL) 40 MG tablet Take 1 tablet (40 mg total)  by mouth daily.  . metFORMIN (GLUCOPHAGE XR) 500 MG 24 hr tablet Take 1 tablet (500 mg total) by mouth in the morning and at bedtime.  . Multiple Vitamin (MULTIVITAMIN WITH MINERALS) TABS tablet Take 1 tablet by mouth daily.   . NON FORMULARY Uses a CPAP machine at night   . OXYGEN Inhale 3 L into the lungs at bedtime.   . triamterene-hydrochlorothiazide (MAXZIDE-25) 37.5-25 MG tablet Take 1 tablet by mouth daily.  . [DISCONTINUED] amLODipine (NORVASC) 10 MG tablet Take 1 tablet (10 mg total) by mouth daily.  . [DISCONTINUED] nicotine (NICODERM CQ - DOSED IN MG/24 HOURS) 21 mg/24hr patch Place 21 mg onto the skin daily.   Marland Kitchen acetaminophen (TYLENOL) 325 MG tablet Take 325 mg by mouth daily.  (Patient not taking: Reported on 04/18/2020)   No facility-administered medications prior to visit.    Review of Systems  Constitutional: Negative.   Respiratory: Negative.   Cardiovascular: Negative.    Musculoskeletal: Negative.        Objective    BP (!) 174/103 (BP Location: Left Arm, Patient Position: Sitting, Cuff Size: Normal)   Pulse 82   Temp 98.1 F (36.7 C) (Oral)   Ht 6' 2" (1.88 m)   Wt 254 lb 6.4 oz (115.4 kg)   SpO2 97%   BMI 32.66 kg/m     Physical Exam Constitutional:      Appearance: Normal appearance.  Cardiovascular:     Rate and Rhythm: Normal rate and regular rhythm.     Heart sounds: Normal heart sounds.  Pulmonary:     Effort: Pulmonary effort is normal.     Breath sounds: Normal breath sounds.  Skin:    General: Skin is warm and dry.  Neurological:     Mental Status: He is alert and oriented to person, place, and time. Mental status is at baseline.  Psychiatric:        Mood and Affect: Mood normal.        Behavior: Behavior normal.       No results found for any visits on 08/21/20.  Assessment & Plan    1. Simple chronic bronchitis ([place])  He is noncompliant with inhaler. Still smoking but intending on quitting prior to the end of the year because his life insurance company will cancel his policy if he doesn't.  2. Hyperlipidemia, unspecified hyperlipidemia type  Encouraged compliance with medication, continue statin and recheck next visit.    3. Diabetes mellitus without complication ([place]) POCT didn't read his a1c in office, will get bloodwork as below.   - HgB A1c  4. Essential hypertension  He reports increasing compliance but his BP remians elevated. Encouraged compliance.   - amLODipine (NORVASC) 10 MG tablet; Take 1 tablet (10 mg total) by mouth daily.  Dispense: 90 tablet; Refill: 0  5. Anxiety disorder, unspecified type  Follow up with [facility].  6. Encounter for tobacco use cessation counseling  I have counseled > 3 minutes about risks/benefits of tobacco cessation.   - nicotine (NICODERM CQ) 21 mg/24hr patch; Place 1 patch (21 mg total) onto the skin daily.  Dispense: 28 patch; Refill: 0        I, Trinna Post, PA-C, have reviewed all documentation for this visit. The documentation on 08/21/20 for the exam, diagnosis, procedures, and orders are all accurate and complete.  The entirety of the information documented in the History of Present Illness, Review of Systems and Physical Exam were personally obtained by  me. Portions of this information were initially documented by Idelle Jo, CMA and reviewed by me for thoroughness and accuracy.        Paulene Floor  [facility] [phone] (phone) [phone] (fax)  [place]

## 2020-08-21 NOTE — Patient Instructions (Signed)

## 2020-08-21 NOTE — Progress Notes (Deleted)
     Established patient visit   Patient: Eric Lawrence   DOB: 10/29/1966   54 y.o. Male  MRN: [id number] Visit Date: 08/21/2020  Today's healthcare provider: Trinna Post, PA-C   No chief complaint on file.  Subjective    HPI    {Show patient history (optional):23778::" "}   Medications: Outpatient Medications Prior to Visit  Medication Sig  . acetaminophen (TYLENOL) 325 MG tablet Take 325 mg by mouth daily.  (Patient not taking: Reported on 04/18/2020)  . acetaminophen (TYLENOL) 500 MG tablet Take 500 mg by mouth every 6 (six) hours as needed.   Marland Kitchen amLODipine (NORVASC) 10 MG tablet Take 1 tablet (10 mg total) by mouth daily.  Marland Kitchen aspirin 81 MG EC tablet Take by mouth.   Marland Kitchen atorvastatin (LIPITOR) 10 MG tablet Take 1 tablet (10 mg total) by mouth daily.  . blood glucose meter kit and supplies Check glucose once a day.  (FOR ICD-10 E10.9, E11.9).  Marland Kitchen buPROPion (WELLBUTRIN XL) 300 MG 24 hr tablet Take 300 mg by mouth 2 (two) times daily.   . carvedilol (COREG) 6.25 MG tablet Take 6.25 mg by mouth 2 (two) times daily.  . Fluticasone-Salmeterol (ADVAIR DISKUS) 250-50 MCG/DOSE AEPB Inhale 1 puff into the lungs 2 (two) times daily.  Marland Kitchen lisinopril (PRINIVIL,ZESTRIL) 40 MG tablet Take 1 tablet (40 mg total) by mouth daily.  . metFORMIN (GLUCOPHAGE XR) 500 MG 24 hr tablet Take 1 tablet (500 mg total) by mouth in the morning and at bedtime.  . Multiple Vitamin (MULTIVITAMIN WITH MINERALS) TABS tablet Take 1 tablet by mouth daily.   . nicotine (NICODERM CQ - DOSED IN MG/24 HOURS) 21 mg/24hr patch Place 21 mg onto the skin daily.   . NON FORMULARY Uses a CPAP machine at night   . OXYGEN Inhale 3 L into the lungs at bedtime.   . triamterene-hydrochlorothiazide (MAXZIDE-25) 37.5-25 MG tablet Take 1 tablet by mouth daily.   No facility-administered medications prior to visit.    Review of Systems  {Heme  Chem  Endocrine  Serology  Results Review (optional):23779::" "}  Objective      There were no vitals taken for this visit. {Show previous vital signs (optional):23777::" "}  Physical Exam  ***  No results found for any visits on 08/21/20.  Assessment & Plan     ***  No follow-ups on file.      {provider attestation***:1}   Paulene Floor  [facility] [phone] (phone) [phone] (fax)  [place]

## 2020-08-22 LAB — HEMOGLOBIN A1C
Est. average glucose Bld gHb Est-mCnc: 151 mg/dL
Hgb A1c MFr Bld: 6.9 % — ABNORMAL HIGH (ref 4.8–5.6)

## 2020-08-23 ENCOUNTER — Telehealth: Payer: Self-pay | Admitting: *Deleted

## 2020-08-23 NOTE — Chronic Care Management (AMB) (Signed)
  Care Management   Note  08/23/2020 Name: Eric Lawrence MRN: [id number] DOB: 1966/05/08  Eric Lawrence is a 54 y.o. year old male who is a primary care patient of Maryella Shivers and is actively engaged with the care management team. I reached out to Eric Lawrence by phone today to assist with re-scheduling an initial visit with the RN Case Manager.  Follow up plan: Unsuccessful telephone outreach attempt made. A HIPAA compliant phone message was left for the patient providing contact information and requesting a return call. If patient returns call to provider office, please advise to call Embedded Care Management Care Guide Linard Millers at [phone].  Gwenevere Ghazi  Care Guide, Embedded Care Coordination [facility] Management

## 2020-08-30 NOTE — Chronic Care Management (AMB) (Signed)
  Care Management   Note  08/30/2020 Name: Eric Lawrence MRN: [id number] DOB: 1965/11/27  Eric Lawrence is a 54 y.o. year old male who is a primary care patient of Maryella Shivers and is actively engaged with the care management team. I reached out to Eric Lawrence by phone today to assist with re-scheduling an initial visit with the RN Case Manager.  Follow up plan: Unsuccessful telephone outreach attempt made. A HIPAA compliant phone message was left for the patient providing contact information and requesting a return call.  Unable to make contact on outreach attempts x 2. PCP Trey Sailors, PA-C. notified via routed documentation in medical record.   Gwenevere Ghazi  Care Guide, Embedded Care Coordination [facility]  [place], [place] [address] Direct Dial: [phone] [email or web address] Website: [email or web address]

## 2020-10-09 ENCOUNTER — Ambulatory Visit: Payer: Medicare Other | Admitting: Physician Assistant

## 2020-11-20 NOTE — Progress Notes (Deleted)
Established patient visit   Patient: Eric Lawrence   DOB: [birth year]   55 y.o. Male  MRN: [id number] Visit Date: 11/21/2020  Today's healthcare provider: Trinna Post, PA-C   No chief complaint on file.  Subjective    HPI  Hypertension, follow-up  BP Readings from Last 3 Encounters:  08/21/20 (!) 174/103  07/20/20 (!) 198/117  07/10/20 (!) 158/98   Wt Readings from Last 3 Encounters:  08/21/20 254 lb 6.4 oz (115.4 kg)  07/20/20 260 lb (117.9 kg)  07/10/20 262 lb 9.6 oz (119.1 kg)     He was last seen for hypertension 3 months ago.  BP at that visit was 174/103. Management since that visit includes continue current medication.  He reports {excellent/good/fair/poor:19665} compliance with treatment. He {is/is not:9024} having side effects. {document side effects if present:1} He is following a {diet:21022986} diet. He {is/is not:9024} exercising. He {does/does not:200015} smoke.  Use of agents associated with hypertension: {bp agents assoc with hypertension:511::"none"}.   Outside blood pressures are {***enter patient reported home BP readings, or 'not being checked':1}. Symptoms: {Yes/No:20286} chest pain {Yes/No:20286} chest pressure  {Yes/No:20286} palpitations {Yes/No:20286} syncope  {Yes/No:20286} dyspnea {Yes/No:20286} orthopnea  {Yes/No:20286} paroxysmal nocturnal dyspnea {Yes/No:20286} lower extremity edema   Pertinent labs: Lab Results  Component Value Date   CHOL 179 05/09/2020   HDL 28 (L) 05/09/2020   LDLCALC 107 (H) 05/09/2020   TRIG 252 (H) 05/09/2020   CHOLHDL 6.4 (H) 05/09/2020   Lab Results  Component Value Date   NA 140 05/09/2020   K 4.3 05/09/2020   CREATININE 0.96 05/09/2020   GFRNONAA 88 05/09/2020   GFRAA 102 05/09/2020   GLUCOSE 178 (H) 05/09/2020     The 10-year ASCVD risk score Mikey Bussing DC Jr., et al., 2013) is: 48.8%    ---------------------------------------------------------------------------------------------------   {Show patient history (optional):23778::" "}    Medications: Outpatient Medications Prior to Visit  Medication Sig  . acetaminophen (TYLENOL) 325 MG tablet Take 325 mg by mouth daily.  (Patient not taking: Reported on 04/18/2020)  . acetaminophen (TYLENOL) 500 MG tablet Take 500 mg by mouth every 6 (six) hours as needed.   Marland Kitchen amLODipine (NORVASC) 10 MG tablet Take 1 tablet (10 mg total) by mouth daily.  Marland Kitchen aspirin 81 MG EC tablet Take by mouth.   Marland Kitchen atorvastatin (LIPITOR) 10 MG tablet Take 1 tablet (10 mg total) by mouth daily.  . blood glucose meter kit and supplies Check glucose once a day.  (FOR ICD-10 E10.9, E11.9).  Marland Kitchen buPROPion (WELLBUTRIN XL) 300 MG 24 hr tablet Take 300 mg by mouth 2 (two) times daily.   . carvedilol (COREG) 6.25 MG tablet Take 6.25 mg by mouth 2 (two) times daily.  . Fluticasone-Salmeterol (ADVAIR DISKUS) 250-50 MCG/DOSE AEPB Inhale 1 puff into the lungs 2 (two) times daily.  Marland Kitchen lisinopril (PRINIVIL,ZESTRIL) 40 MG tablet Take 1 tablet (40 mg total) by mouth daily.  . metFORMIN (GLUCOPHAGE XR) 500 MG 24 hr tablet Take 1 tablet (500 mg total) by mouth in the morning and at bedtime.  . Multiple Vitamin (MULTIVITAMIN WITH MINERALS) TABS tablet Take 1 tablet by mouth daily.   . nicotine (NICODERM CQ) 21 mg/24hr patch Place 1 patch (21 mg total) onto the skin daily.  . NON FORMULARY Uses a CPAP machine at night   . OXYGEN Inhale 3 L into the lungs at bedtime.   . triamterene-hydrochlorothiazide (MAXZIDE-25) 37.5-25 MG tablet Take 1 tablet by mouth daily.   No  facility-administered medications prior to visit.    Review of Systems  {Labs  Heme  Chem  Endocrine  Serology  Results Review (optional):23779::" "}    Objective    There were no vitals taken for this visit. {Show previous vital signs (optional):23777::" "}    Physical Exam  ***  No results found  for any visits on 11/21/20.  Assessment & Plan     ***  No follow-ups on file.      {provider attestation***:1}   Paulene Floor  [facility] [phone] (phone) [phone] (fax)  [place]

## 2020-11-21 ENCOUNTER — Ambulatory Visit: Payer: Medicare Other | Admitting: Physician Assistant

## 2020-12-06 ENCOUNTER — Telehealth: Payer: Self-pay | Admitting: Physician Assistant

## 2020-12-06 ENCOUNTER — Other Ambulatory Visit: Payer: Self-pay

## 2020-12-06 ENCOUNTER — Encounter: Payer: Self-pay | Admitting: Physician Assistant

## 2020-12-06 ENCOUNTER — Ambulatory Visit (INDEPENDENT_AMBULATORY_CARE_PROVIDER_SITE_OTHER): Payer: Medicare Other | Admitting: Physician Assistant

## 2020-12-06 VITALS — BP 174/110 | HR 88 | Temp 98.2°F | Wt 254.0 lb

## 2020-12-06 DIAGNOSIS — E1169 Type 2 diabetes mellitus with other specified complication: Secondary | ICD-10-CM

## 2020-12-06 DIAGNOSIS — E785 Hyperlipidemia, unspecified: Secondary | ICD-10-CM

## 2020-12-06 DIAGNOSIS — E11319 Type 2 diabetes mellitus with unspecified diabetic retinopathy without macular edema: Secondary | ICD-10-CM

## 2020-12-06 DIAGNOSIS — F3340 Major depressive disorder, recurrent, in remission, unspecified: Secondary | ICD-10-CM

## 2020-12-06 DIAGNOSIS — F17200 Nicotine dependence, unspecified, uncomplicated: Secondary | ICD-10-CM

## 2020-12-06 DIAGNOSIS — J410 Simple chronic bronchitis: Secondary | ICD-10-CM | POA: Diagnosis not present

## 2020-12-06 DIAGNOSIS — R350 Frequency of micturition: Secondary | ICD-10-CM

## 2020-12-06 DIAGNOSIS — I472 Ventricular tachycardia: Secondary | ICD-10-CM

## 2020-12-06 DIAGNOSIS — I10 Essential (primary) hypertension: Secondary | ICD-10-CM

## 2020-12-06 DIAGNOSIS — F419 Anxiety disorder, unspecified: Secondary | ICD-10-CM

## 2020-12-06 DIAGNOSIS — Z1211 Encounter for screening for malignant neoplasm of colon: Secondary | ICD-10-CM

## 2020-12-06 DIAGNOSIS — I4729 Other ventricular tachycardia: Secondary | ICD-10-CM

## 2020-12-06 NOTE — Patient Instructions (Signed)

## 2020-12-06 NOTE — Telephone Encounter (Signed)
Can we reach out to [facility] to have them call patient for eye exam? He is current patient and doesn't need referral but I do not think he will be able to schedule himself.

## 2020-12-06 NOTE — Progress Notes (Signed)
Established patient visit   Patient: Eric Lawrence   DOB: May 11, 1966   55 y.o. Male  MRN: [id number] Visit Date: 12/06/2020  Today's healthcare provider: Trinna Post, PA-C   Chief Complaint  Patient presents with  . Hypertension  I,Eric Narine M Maximo Lawrence,acting as a scribe for [employer], PA-C.,have documented all relevant documentation on the behalf of Trinna Post, PA-C,as directed by  Trinna Post, PA-C while in the presence of Trinna Post, PA-C.  Subjective    HPI   Currently experiencing some difficulties with housing situation. Reports difficulty with roommates and stressors regarding this. Lives with a roommate and cares for 14 year old son of roommate. Got into a conflict with roommates daughter and mother of child. Patient intends to move into independent housing after this and is trying to pursue income based housing. Thinks he will be able to manage his health better. He has some recent deaths among his friends and family.   Hypertension, follow-up  BP Readings from Last 3 Encounters:  12/06/20 (!) 174/110  08/21/20 (!) 174/103  07/20/20 (!) 198/117   Wt Readings from Last 3 Encounters:  12/06/20 254 lb (115.2 kg)  08/21/20 254 lb 6.4 oz (115.4 kg)  07/20/20 260 lb (117.9 kg)     He was last seen for hypertension 3 months ago.  BP at that visit was 174/103. Management since that visit includes continue current medication.  He reports poor compliance with treatment. He does not take his medications regularly and this is a chronic issue for him.  He is not having side effects.  He is following a Regular diet. He is exercising. He does smoke.  Use of agents associated with hypertension: none.   Outside blood pressures are not being checked. Symptoms: No chest pain No chest pressure  Yes palpitations No syncope  No dyspnea No orthopnea  No paroxysmal nocturnal dyspnea No lower extremity edema   Pertinent labs: Lab Results  Component  Value Date   CHOL 179 05/09/2020   HDL 28 (L) 05/09/2020   LDLCALC 107 (H) 05/09/2020   TRIG 252 (H) 05/09/2020   CHOLHDL 6.4 (H) 05/09/2020   Lab Results  Component Value Date   NA 140 05/09/2020   K 4.3 05/09/2020   CREATININE 0.96 05/09/2020   GFRNONAA 88 05/09/2020   GFRAA 102 05/09/2020   GLUCOSE 178 (H) 05/09/2020     The 10-year ASCVD risk score Mikey Bussing DC Jr., et al., 2013) is: 48.8%   Wt Readings from Last 3 Encounters:  12/06/20 254 lb (115.2 kg)  08/21/20 254 lb 6.4 oz (115.4 kg)  07/20/20 260 lb (117.9 kg)   Diabetes Mellitus Type II, Follow-up  Lab Results  Component Value Date   HGBA1C 6.9 (H) 08/21/2020   HGBA1C 7.3 (H) 05/09/2020   HGBA1C 6.4 (A) 11/08/2019   Wt Readings from Last 3 Encounters:  12/06/20 254 lb (115.2 kg)  08/21/20 254 lb 6.4 oz (115.4 kg)  07/20/20 260 lb (117.9 kg)   Last seen for diabetes 4 months ago.  Management since then includes metformin 500 XR mg BID. He reports good compliance with treatment. He is not having side effects.  Symptoms: No fatigue No foot ulcerations  No appetite changes No nausea  No paresthesia of the feet  No polydipsia  No polyuria No visual disturbances   No vomiting     Home blood sugar records: fasting range: 100s  Episodes of hypoglycemia? No  Current insulin regiment: none Most Recent Eye Exam: 01/2020 Current exercise: none Current diet habits: in general, an "unhealthy" diet  Pertinent Labs: Lab Results  Component Value Date   CHOL 179 05/09/2020   HDL 28 (L) 05/09/2020   LDLCALC 107 (H) 05/09/2020   TRIG 252 (H) 05/09/2020   CHOLHDL 6.4 (H) 05/09/2020   Lab Results  Component Value Date   NA 140 05/09/2020   K 4.3 05/09/2020   CREATININE 0.96 05/09/2020   GFRNONAA 88 05/09/2020   GFRAA 102 05/09/2020   GLUCOSE 178 (H) 05/09/2020     ------------------------------------------------------------------------------------------------------- Lipid/Cholesterol, Follow-up  Last  lipid panel Other pertinent labs  Lab Results  Component Value Date   CHOL 179 05/09/2020   HDL 28 (L) 05/09/2020   LDLCALC 107 (H) 05/09/2020   TRIG 252 (H) 05/09/2020   CHOLHDL 6.4 (H) 05/09/2020   Lab Results  Component Value Date   ALT 26 05/09/2020   AST 16 05/09/2020   PLT 157 05/09/2020   TSH 1.400 05/09/2020     He was last seen for this 4 months ago.  Management since that visit includes continue metformin XR 500 mg BID.  He reports good compliance with treatment. He is not having side effects.   Symptoms: No chest pain No chest pressure/discomfort  No dyspnea No lower extremity edema  No numbness or tingling of extremity No orthopnea  No palpitations No paroxysmal nocturnal dyspnea  No speech difficulty No syncope   Current diet: in general, an "unhealthy" diet Current exercise: none  The 10-year ASCVD risk score Mikey Bussing DC Jr., et al., 2013) is: 48.8%  --------------------------------------------------------------------------------------------------- COPD, Follow up  He was last seen for this 4 months ago. Changes made include counseled on smoking cessation.  he uses rescue inhaler 0 per days. He IS experiencing cough. He is NOT experiencing cough. he reports breathing is Unchanged.  Pulmonary Functions Testing Results:  No results found for: FEV1, FVC, FEV1FVC, TLC  He is continuing smoking.  -----------------------------------------------------------------------------------------  Depression, Follow-up  Reports he can no longer be seen at [facility] for psychiatry visit due to them not accepting insurance anymore.   Depression screen [facility] 2/9 08/21/2020 04/18/2020 11/08/2019  Decreased Interest 0 0 0  Down, Depressed, Hopeless 0 1 0  PHQ - 2 Score 0 1 0  Altered sleeping 0 - 0  Tired, decreased energy 0 - 0  Change in appetite 0 - 0  Feeling bad or failure about yourself  0 - 0  Trouble concentrating 0 - 0  Moving slowly or  fidgety/restless 0 - 0  Suicidal thoughts 0 - 0  PHQ-9 Score 0 - 0  Difficult doing work/chores - - -  Some recent data might be hidden    -----------------------------------------------------------------------------------------      Medications: Outpatient Medications Prior to Visit  Medication Sig  . acetaminophen (TYLENOL) 500 MG tablet Take 500 mg by mouth every 6 (six) hours as needed.   Marland Kitchen acetaminophen (TYLENOL) 500 MG tablet Take 500 mg by mouth daily.  Marland Kitchen amLODipine (NORVASC) 10 MG tablet Take 1 tablet (10 mg total) by mouth daily.  Marland Kitchen aspirin 81 MG EC tablet Take by mouth.   Marland Kitchen atorvastatin (LIPITOR) 10 MG tablet Take 1 tablet (10 mg total) by mouth daily.  . blood glucose meter kit and supplies Check glucose once a day.  (FOR ICD-10 E10.9, E11.9).  Marland Kitchen buPROPion (WELLBUTRIN XL) 300 MG 24 hr tablet Take 300 mg by mouth 2 (two) times daily.   Marland Kitchen  carvedilol (COREG) 6.25 MG tablet Take 6.25 mg by mouth 2 (two) times daily.  . Fluticasone-Salmeterol (ADVAIR DISKUS) 250-50 MCG/DOSE AEPB Inhale 1 puff into the lungs 2 (two) times daily.  Marland Kitchen lisinopril (PRINIVIL,ZESTRIL) 40 MG tablet Take 1 tablet (40 mg total) by mouth daily.  . metFORMIN (GLUCOPHAGE XR) 500 MG 24 hr tablet Take 1 tablet (500 mg total) by mouth in the morning and at bedtime.  . Multiple Vitamin (MULTIVITAMIN WITH MINERALS) TABS tablet Take 1 tablet by mouth daily.   . nicotine (NICODERM CQ) 21 mg/24hr patch Place 1 patch (21 mg total) onto the skin daily.  . NON FORMULARY Uses a CPAP machine at night   . OXYGEN Inhale 3 L into the lungs at bedtime.  . triamterene-hydrochlorothiazide (MAXZIDE-25) 37.5-25 MG tablet Take 1 tablet by mouth daily.  . [DISCONTINUED] acetaminophen (TYLENOL) 325 MG tablet Take 500 mg by mouth daily.   No facility-administered medications prior to visit.    Review of Systems  Constitutional: Negative.   Respiratory: Negative.   Cardiovascular: Negative.   Neurological: Negative.    Hematological: Negative.        Objective    BP (!) 174/110 (BP Location: Left Arm, Patient Position: Sitting, Cuff Size: Large)   Pulse 88   Temp 98.2 F (36.8 C) (Oral)   Wt 254 lb (115.2 kg)   SpO2 98%   BMI 32.61 kg/m     Physical Exam Constitutional:      Appearance: Normal appearance.  Cardiovascular:     Rate and Rhythm: Normal rate and regular rhythm.     Heart sounds: Normal heart sounds.  Pulmonary:     Effort: Pulmonary effort is normal.     Breath sounds: Wheezing present.  Skin:    General: Skin is warm and dry.  Neurological:     General: No focal deficit present.     Mental Status: He is alert and oriented to person, place, and time. Mental status is at baseline.  Psychiatric:        Mood and Affect: Mood normal.        Behavior: Behavior normal.       No results found for any visits on 12/06/20.  Assessment & Plan    1. Type 2 diabetes mellitus with retinopathy, without long-term current use of insulin, macular edema presence unspecified, unspecified laterality, unspecified retinopathy severity ([place])  Controlled, continue current medications. Will reach out to [facility] to schedule eye exam to f/u diabetic retinopathy.   - TSH - Lipid panel - Comprehensive metabolic panel - CBC with Differential/Platelet  2. Simple chronic bronchitis (HCC)  Continues to smoke. Patient intending to move out and thinks this will be helpful with quitting smoking.    3. Recurrent major depressive disorder, in remission ([place])  Reports [place] [facility] will not accept his insurance anymore.   4. Hyperlipidemia associated with type 2 diabetes mellitus ([place])  Continue statin, he follows with cardiology.   - TSH - Lipid panel - Comprehensive metabolic panel - CBC with Differential/Platelet  5. NSVT (nonsustained ventricular tachycardia) ([place])  Followed by cardiology.    6. Essential hypertension  Chronically noncompliant with blood  pressure medication.   - TSH - Lipid panel - Comprehensive metabolic panel - CBC with Differential/Platelet  7. Compulsive tobacco user syndrome  Would like to stop, feels he will have a better chance of doing so once he lives independently. He is currently using nicotine replacement patches.   8. Colon cancer  screening  Will proceed with cologuard, HTN has prohibited colonoscopy. - Cologuard  9. Anxiety disorder, unspecified type  Refer to [facility].   - Ambulatory referral to Psychiatry    No follow-ups on file.      ITrinna Post, PA-C, have reviewed all documentation for this visit. The documentation on 12/06/20 for the exam, diagnosis, procedures, and orders are all accurate and complete.  The entirety of the information documented in the History of Present Illness, Review of Systems and Physical Exam were personally obtained by me. Portions of this information were initially documented by  [facility] and reviewed by me for thoroughness and accuracy.      Paulene Floor  [facility] [phone] (phone) [phone] (fax)  [place]

## 2020-12-07 LAB — CBC WITH DIFFERENTIAL/PLATELET
Basophils Absolute: 0.1 10*3/uL (ref 0.0–0.2)
Basos: 1 %
EOS (ABSOLUTE): 0.2 10*3/uL (ref 0.0–0.4)
Eos: 3 %
Hematocrit: 50.5 % (ref 37.5–51.0)
Hemoglobin: 17.2 g/dL (ref 13.0–17.7)
Immature Grans (Abs): 0 10*3/uL (ref 0.0–0.1)
Immature Granulocytes: 0 %
Lymphocytes Absolute: 2.8 10*3/uL (ref 0.7–3.1)
Lymphs: 38 %
MCH: 30.6 pg (ref 26.6–33.0)
MCHC: 34.1 g/dL (ref 31.5–35.7)
MCV: 90 fL (ref 79–97)
Monocytes Absolute: 0.5 10*3/uL (ref 0.1–0.9)
Monocytes: 7 %
Neutrophils Absolute: 3.8 10*3/uL (ref 1.4–7.0)
Neutrophils: 51 %
Platelets: 187 10*3/uL (ref 150–450)
RBC: 5.62 x10E6/uL (ref 4.14–5.80)
RDW: 13.3 % (ref 11.6–15.4)
WBC: 7.5 10*3/uL (ref 3.4–10.8)

## 2020-12-07 LAB — LIPID PANEL
Chol/HDL Ratio: 6.8 ratio — ABNORMAL HIGH (ref 0.0–5.0)
Cholesterol, Total: 170 mg/dL (ref 100–199)
HDL: 25 mg/dL — ABNORMAL LOW (ref >39)
LDL Chol Calc (NIH): 91 mg/dL (ref 0–99)
Triglycerides: 324 mg/dL — ABNORMAL HIGH (ref 0–149)
VLDL Cholesterol Cal: 54 mg/dL — ABNORMAL HIGH (ref 5–40)

## 2020-12-07 LAB — COMPREHENSIVE METABOLIC PANEL
ALT: 29 IU/L (ref 0–44)
AST: 20 IU/L (ref 0–40)
Albumin/Globulin Ratio: 1.8 (ref 1.2–2.2)
Albumin: 4.5 g/dL (ref 3.8–4.9)
Alkaline Phosphatase: 112 IU/L (ref 44–121)
BUN/Creatinine Ratio: 10 (ref 9–20)
BUN: 11 mg/dL (ref 6–24)
Bilirubin Total: 0.3 mg/dL (ref 0.0–1.2)
CO2: 28 mmol/L (ref 20–29)
Calcium: 9.7 mg/dL (ref 8.7–10.2)
Chloride: 100 mmol/L (ref 96–106)
Creatinine, Ser: 1.1 mg/dL (ref 0.76–1.27)
GFR calc Af Amer: 86 mL/min/{1.73_m2} (ref >59)
GFR calc non Af Amer: 75 mL/min/{1.73_m2} (ref >59)
Globulin, Total: 2.5 g/dL (ref 1.5–4.5)
Glucose: 157 mg/dL — ABNORMAL HIGH (ref 65–99)
Potassium: 4.4 mmol/L (ref 3.5–5.2)
Sodium: 141 mmol/L (ref 134–144)
Total Protein: 7 g/dL (ref 6.0–8.5)

## 2020-12-07 LAB — TSH: TSH: 1.37 u[IU]/mL (ref 0.450–4.500)

## 2020-12-07 LAB — PSA: Prostate Specific Ag, Serum: 0.6 ng/mL (ref 0.0–4.0)

## 2020-12-19 LAB — COLOGUARD: Cologuard: POSITIVE — AB

## 2020-12-25 ENCOUNTER — Telehealth: Payer: Self-pay | Admitting: Physician Assistant

## 2020-12-25 DIAGNOSIS — R195 Other fecal abnormalities: Secondary | ICD-10-CM

## 2020-12-25 NOTE — Telephone Encounter (Addendum)
Eric Lawrence with [employer] calling to advise they got an abnormal colorguard result on the patient.  She says they faxed  Result on Monday, and will re fax now.  cb  [phone]

## 2020-12-25 NOTE — Telephone Encounter (Signed)
Patient was advised and verbalized understanding. 

## 2020-12-25 NOTE — Telephone Encounter (Signed)
Cologuard test positive. He will need a colonoscopy. His BP has been prohibitive. Once his BP is controlled, he will need a colonoscopy.

## 2021-04-18 DIAGNOSIS — I720 Aneurysm of carotid artery: Secondary | ICD-10-CM | POA: Diagnosis not present

## 2021-04-18 DIAGNOSIS — J449 Chronic obstructive pulmonary disease, unspecified: Secondary | ICD-10-CM | POA: Diagnosis not present

## 2021-04-24 ENCOUNTER — Ambulatory Visit: Payer: Medicare Other

## 2021-05-02 DIAGNOSIS — E118 Type 2 diabetes mellitus with unspecified complications: Secondary | ICD-10-CM | POA: Diagnosis not present

## 2021-05-18 DIAGNOSIS — I720 Aneurysm of carotid artery: Secondary | ICD-10-CM | POA: Diagnosis not present

## 2021-05-18 DIAGNOSIS — J449 Chronic obstructive pulmonary disease, unspecified: Secondary | ICD-10-CM | POA: Diagnosis not present

## 2021-06-03 DIAGNOSIS — E118 Type 2 diabetes mellitus with unspecified complications: Secondary | ICD-10-CM | POA: Diagnosis not present

## 2021-06-05 ENCOUNTER — Ambulatory Visit: Payer: Medicare Other | Admitting: Physician Assistant

## 2021-06-06 ENCOUNTER — Encounter: Payer: Self-pay | Admitting: Family Medicine

## 2021-06-06 ENCOUNTER — Ambulatory Visit (INDEPENDENT_AMBULATORY_CARE_PROVIDER_SITE_OTHER): Payer: Medicare Other | Admitting: Family Medicine

## 2021-06-06 ENCOUNTER — Other Ambulatory Visit: Payer: Self-pay | Admitting: Family Medicine

## 2021-06-06 ENCOUNTER — Other Ambulatory Visit: Payer: Self-pay

## 2021-06-06 ENCOUNTER — Telehealth: Payer: Self-pay | Admitting: Family Medicine

## 2021-06-06 VITALS — BP 172/104 | HR 86 | Temp 97.9°F | Resp 18 | Wt 252.0 lb

## 2021-06-06 DIAGNOSIS — N401 Enlarged prostate with lower urinary tract symptoms: Secondary | ICD-10-CM

## 2021-06-06 DIAGNOSIS — J410 Simple chronic bronchitis: Secondary | ICD-10-CM

## 2021-06-06 DIAGNOSIS — R195 Other fecal abnormalities: Secondary | ICD-10-CM | POA: Diagnosis not present

## 2021-06-06 DIAGNOSIS — I998 Other disorder of circulatory system: Secondary | ICD-10-CM

## 2021-06-06 DIAGNOSIS — F32A Depression, unspecified: Secondary | ICD-10-CM | POA: Diagnosis not present

## 2021-06-06 DIAGNOSIS — F17200 Nicotine dependence, unspecified, uncomplicated: Secondary | ICD-10-CM | POA: Diagnosis not present

## 2021-06-06 DIAGNOSIS — E785 Hyperlipidemia, unspecified: Secondary | ICD-10-CM | POA: Diagnosis not present

## 2021-06-06 DIAGNOSIS — E1169 Type 2 diabetes mellitus with other specified complication: Secondary | ICD-10-CM | POA: Diagnosis not present

## 2021-06-06 DIAGNOSIS — I10 Essential (primary) hypertension: Secondary | ICD-10-CM | POA: Diagnosis not present

## 2021-06-06 DIAGNOSIS — F419 Anxiety disorder, unspecified: Secondary | ICD-10-CM | POA: Diagnosis not present

## 2021-06-06 DIAGNOSIS — E119 Type 2 diabetes mellitus without complications: Secondary | ICD-10-CM | POA: Diagnosis not present

## 2021-06-06 DIAGNOSIS — E118 Type 2 diabetes mellitus with unspecified complications: Secondary | ICD-10-CM

## 2021-06-06 MED ORDER — ATORVASTATIN CALCIUM 10 MG PO TABS: 10.0000 mg | ORAL_TABLET | Freq: Every day | ORAL | 3 refills | Status: DC

## 2021-06-06 MED ORDER — TRIAMTERENE-HCTZ 37.5-25 MG PO TABS: 1.0000 | ORAL_TABLET | Freq: Every day | ORAL | 3 refills | Status: AC

## 2021-06-06 MED ORDER — BUPROPION HCL ER (XL) 300 MG PO TB24: 300.0000 mg | ORAL_TABLET | Freq: Two times a day (BID) | ORAL | 1 refills | Status: AC

## 2021-06-06 MED ORDER — LISINOPRIL 40 MG PO TABS: 40.0000 mg | ORAL_TABLET | Freq: Every day | ORAL | 3 refills | Status: DC

## 2021-06-06 MED ORDER — METFORMIN HCL ER 500 MG PO TB24: 500.0000 mg | ORAL_TABLET | Freq: Two times a day (BID) | ORAL | 1 refills | Status: AC

## 2021-06-06 MED ORDER — CARVEDILOL 12.5 MG PO TABS: 12.5000 mg | ORAL_TABLET | Freq: Two times a day (BID) | ORAL | 1 refills | Status: AC

## 2021-06-06 MED ORDER — ALBUTEROL SULFATE (2.5 MG/3ML) 0.083% IN NEBU: 2.5000 mg | INHALATION_SOLUTION | Freq: Four times a day (QID) | RESPIRATORY_TRACT | 1 refills | Status: DC | PRN

## 2021-06-06 MED ORDER — TAMSULOSIN HCL 0.4 MG PO CAPS: 0.4000 mg | ORAL_CAPSULE | Freq: Every day | ORAL | 1 refills | Status: AC

## 2021-06-06 MED ORDER — HYDROXYZINE PAMOATE 25 MG PO CAPS: 25.0000 mg | ORAL_CAPSULE | Freq: Three times a day (TID) | ORAL | 1 refills | Status: AC | PRN

## 2021-06-06 MED ORDER — AMLODIPINE BESYLATE 10 MG PO TABS: 10.0000 mg | ORAL_TABLET | Freq: Every day | ORAL | 1 refills | Status: AC

## 2021-06-06 NOTE — Assessment & Plan Note (Signed)
Cool ankles No loss of pulse or sensation Has an appt to see podiatry Skin changes including loss of hair, mottling and shiny  Pt encouraged to stop smoking

## 2021-06-06 NOTE — Assessment & Plan Note (Signed)
Remains hesitant to start daily medication  PRN hydroxyzine added for assistance in crisis  Amb ref to pysch reestablished d/t loss to follow up r/t insurance  Pt previously SI; however, no longer- continues to have many stressors and does not like tele therapy

## 2021-06-06 NOTE — Assessment & Plan Note (Signed)
Sleeps 3-4 hrs at night; then has to get up to void +LUTS Started on flomax Pt hesitant to start medication for anxiety/depression d/t r/f drowsiness and need for incontinence briefs

## 2021-06-06 NOTE — Assessment & Plan Note (Signed)
Describes nicotine as his 'only reason for living' and knows that he should cut back but also feels that he is unable with his lifestyle at this time  Has patches at home for use when ready

## 2021-06-06 NOTE — Assessment & Plan Note (Signed)
Unable to be seen for traditional scope d/t htn  Needs closer f/u for changes to htn medication
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
## 2021-06-06 NOTE — Assessment & Plan Note (Signed)
A1c repeated today  Continue 3 month f/u for chronic conditions  Currently eats 1-2 meals a day; still consuming drinks outside of water with meals

## 2021-06-06 NOTE — Telephone Encounter (Signed)
Pt called stating that he had an appt with PCP earlier today. He states that he is also needing to have a new prescription for the pro air inhaler as his are expired. Please advise.       [facility] [address] ([address]), [place] - [address]  [address]) [place] [address]  Phone: [phone] Fax: [phone]  Hours: Not open 24 hours

## 2021-06-06 NOTE — Assessment & Plan Note (Signed)
Statin use On ACEi Encouraged recommend diet low in saturated fat and regular exercise - 30 min at least 5 times per week  Will f/u on labs
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
## 2021-06-06 NOTE — Assessment & Plan Note (Signed)
On multiple medications; has PPM for previous bradycardia.  Denies CP, SOB or DOE  No longer using CPAP  BB increased to assisted with BP control to allow for specialist referral for colonoscopy   3 month f/u

## 2021-06-07 ENCOUNTER — Other Ambulatory Visit: Payer: Self-pay | Admitting: Family Medicine

## 2021-06-07 DIAGNOSIS — J410 Simple chronic bronchitis: Secondary | ICD-10-CM

## 2021-06-07 LAB — COMPREHENSIVE METABOLIC PANEL
ALT: 27 IU/L (ref 0–44)
AST: 18 IU/L (ref 0–40)
Albumin/Globulin Ratio: 2 (ref 1.2–2.2)
Albumin: 4.3 g/dL (ref 3.8–4.9)
Alkaline Phosphatase: 96 IU/L (ref 44–121)
BUN/Creatinine Ratio: 12 (ref 9–20)
BUN: 13 mg/dL (ref 6–24)
Bilirubin Total: 0.4 mg/dL (ref 0.0–1.2)
CO2: 25 mmol/L (ref 20–29)
Calcium: 9.6 mg/dL (ref 8.7–10.2)
Chloride: 101 mmol/L (ref 96–106)
Creatinine, Ser: 1.08 mg/dL (ref 0.76–1.27)
Globulin, Total: 2.2 g/dL (ref 1.5–4.5)
Glucose: 224 mg/dL — ABNORMAL HIGH (ref 65–99)
Potassium: 4.2 mmol/L (ref 3.5–5.2)
Sodium: 140 mmol/L (ref 134–144)
Total Protein: 6.5 g/dL (ref 6.0–8.5)
eGFR: 80 mL/min/{1.73_m2} (ref >59)

## 2021-06-07 LAB — CBC WITH DIFFERENTIAL/PLATELET
Basophils Absolute: 0 10*3/uL (ref 0.0–0.2)
Basos: 0 %
EOS (ABSOLUTE): 0.2 10*3/uL (ref 0.0–0.4)
Eos: 2 %
Hematocrit: 47.7 % (ref 37.5–51.0)
Hemoglobin: 16.1 g/dL (ref 13.0–17.7)
Immature Grans (Abs): 0 10*3/uL (ref 0.0–0.1)
Immature Granulocytes: 0 %
Lymphocytes Absolute: 2.5 10*3/uL (ref 0.7–3.1)
Lymphs: 28 %
MCH: 31.3 pg (ref 26.6–33.0)
MCHC: 33.8 g/dL (ref 31.5–35.7)
MCV: 93 fL (ref 79–97)
Monocytes Absolute: 0.5 10*3/uL (ref 0.1–0.9)
Monocytes: 5 %
Neutrophils Absolute: 5.8 10*3/uL (ref 1.4–7.0)
Neutrophils: 65 %
Platelets: 203 10*3/uL (ref 150–450)
RBC: 5.15 x10E6/uL (ref 4.14–5.80)
RDW: 13.4 % (ref 11.6–15.4)
WBC: 9.1 10*3/uL (ref 3.4–10.8)

## 2021-06-07 LAB — HEMOGLOBIN A1C
Est. average glucose Bld gHb Est-mCnc: 160 mg/dL
Hgb A1c MFr Bld: 7.2 % — ABNORMAL HIGH (ref 4.8–5.6)

## 2021-06-07 LAB — LIPID PANEL
Chol/HDL Ratio: 6.3 ratio — ABNORMAL HIGH (ref 0.0–5.0)
Cholesterol, Total: 158 mg/dL (ref 100–199)
HDL: 25 mg/dL — ABNORMAL LOW (ref >39)
LDL Chol Calc (NIH): 87 mg/dL (ref 0–99)
Triglycerides: 277 mg/dL — ABNORMAL HIGH (ref 0–149)
VLDL Cholesterol Cal: 46 mg/dL — ABNORMAL HIGH (ref 5–40)

## 2021-06-07 LAB — RENAL FUNCTION PANEL: Phosphorus: 3.3 mg/dL (ref 2.8–4.1)

## 2021-06-07 LAB — TSH: TSH: 1.2 u[IU]/mL (ref 0.450–4.500)

## 2021-06-07 MED ORDER — ALBUTEROL SULFATE HFA 108 (90 BASE) MCG/ACT IN AERS: 2.0000 | INHALATION_SPRAY | Freq: Four times a day (QID) | RESPIRATORY_TRACT | 2 refills | Status: DC | PRN

## 2021-06-07 NOTE — Telephone Encounter (Signed)
Pt states he was prescribed the albuterol (PROVENTIL) (2.5 MG/3ML) 0.083% nebulizer solution.  But pt does not have a machine. Pt is wanting the inhaler, not the solution.   [id number] - [place] ([address]), [place] - [address]

## 2021-06-11 DIAGNOSIS — I495 Sick sinus syndrome: Secondary | ICD-10-CM | POA: Diagnosis not present

## 2021-06-18 DIAGNOSIS — I720 Aneurysm of carotid artery: Secondary | ICD-10-CM | POA: Diagnosis not present

## 2021-06-18 DIAGNOSIS — J449 Chronic obstructive pulmonary disease, unspecified: Secondary | ICD-10-CM | POA: Diagnosis not present

## 2021-07-04 DIAGNOSIS — E118 Type 2 diabetes mellitus with unspecified complications: Secondary | ICD-10-CM | POA: Diagnosis not present

## 2021-07-19 DIAGNOSIS — J449 Chronic obstructive pulmonary disease, unspecified: Secondary | ICD-10-CM | POA: Diagnosis not present

## 2021-07-19 DIAGNOSIS — I720 Aneurysm of carotid artery: Secondary | ICD-10-CM | POA: Diagnosis not present

## 2021-08-05 DIAGNOSIS — E118 Type 2 diabetes mellitus with unspecified complications: Secondary | ICD-10-CM | POA: Diagnosis not present

## 2021-08-09 DIAGNOSIS — J449 Chronic obstructive pulmonary disease, unspecified: Secondary | ICD-10-CM | POA: Diagnosis not present

## 2021-08-09 DIAGNOSIS — K439 Ventral hernia without obstruction or gangrene: Secondary | ICD-10-CM | POA: Diagnosis not present

## 2021-08-09 DIAGNOSIS — M2041 Other hammer toe(s) (acquired), right foot: Secondary | ICD-10-CM | POA: Diagnosis not present

## 2021-08-09 DIAGNOSIS — E119 Type 2 diabetes mellitus without complications: Secondary | ICD-10-CM | POA: Diagnosis not present

## 2021-08-09 DIAGNOSIS — I10 Essential (primary) hypertension: Secondary | ICD-10-CM | POA: Diagnosis not present

## 2021-08-09 DIAGNOSIS — Z85828 Personal history of other malignant neoplasm of skin: Secondary | ICD-10-CM | POA: Diagnosis not present

## 2021-08-09 DIAGNOSIS — G8929 Other chronic pain: Secondary | ICD-10-CM | POA: Diagnosis not present

## 2021-08-09 DIAGNOSIS — Z950 Presence of cardiac pacemaker: Secondary | ICD-10-CM | POA: Diagnosis not present

## 2021-08-09 DIAGNOSIS — M2042 Other hammer toe(s) (acquired), left foot: Secondary | ICD-10-CM | POA: Diagnosis not present

## 2021-08-09 DIAGNOSIS — M549 Dorsalgia, unspecified: Secondary | ICD-10-CM | POA: Diagnosis not present

## 2021-08-18 DIAGNOSIS — I720 Aneurysm of carotid artery: Secondary | ICD-10-CM | POA: Diagnosis not present

## 2021-08-18 DIAGNOSIS — J449 Chronic obstructive pulmonary disease, unspecified: Secondary | ICD-10-CM | POA: Diagnosis not present

## 2021-09-02 DIAGNOSIS — K439 Ventral hernia without obstruction or gangrene: Secondary | ICD-10-CM | POA: Diagnosis not present

## 2021-09-06 ENCOUNTER — Telehealth: Payer: Self-pay

## 2021-09-06 NOTE — Telephone Encounter (Signed)
Left message for patient to call us back to confirm usage of test strips and lancets. We received a fax order from [facility] for supplies. However at last office visit patient reported he was not checking glucose. Also the address that [facility] has is in [place], [place]. I also spoke to [place] at [facility] to confirm patient name and address. Will hold off on signing form for supplies until patient calls [place] to verify usage  and address.

## 2021-09-09 DIAGNOSIS — F17210 Nicotine dependence, cigarettes, uncomplicated: Secondary | ICD-10-CM | POA: Diagnosis not present

## 2021-09-09 DIAGNOSIS — Z950 Presence of cardiac pacemaker: Secondary | ICD-10-CM | POA: Diagnosis not present

## 2021-09-09 DIAGNOSIS — I10 Essential (primary) hypertension: Secondary | ICD-10-CM | POA: Diagnosis not present

## 2021-09-09 DIAGNOSIS — J449 Chronic obstructive pulmonary disease, unspecified: Secondary | ICD-10-CM | POA: Diagnosis not present

## 2021-09-09 DIAGNOSIS — E119 Type 2 diabetes mellitus without complications: Secondary | ICD-10-CM | POA: Diagnosis not present

## 2021-09-10 ENCOUNTER — Ambulatory Visit: Payer: Medicare Other | Admitting: Family Medicine

## 2021-09-10 NOTE — Progress Notes (Deleted)
Established patient visit   Patient: Eric Lawrence   DOB: [birth year]   55 y.o. Male  MRN: [id number] Visit Date: 09/10/2021  Today's healthcare provider: Gwyneth Sprout, FNP   No chief complaint on file.  Subjective    HPI  Diabetes Mellitus Type II, follow-up  Lab Results  Component Value Date   HGBA1C 7.2 (H) 06/06/2021   HGBA1C 6.9 (H) 08/21/2020   HGBA1C 7.3 (H) 05/09/2020   Last seen for diabetes 3 months ago.  Management since then includes continuing the same treatment. He reports {excellent/good/fair/poor:19665} compliance with treatment. He {is/is not:21021397} having side effects. {document side effects if present:1}  Home blood sugar records:  not being checked  Episodes of hypoglycemia? No {enter details if yes:1}   Current insulin regiment: none Most Recent Eye Exam: ***  --------------------------------------------------------------------------------------------------- Hypertension, follow-up  BP Readings from Last 3 Encounters:  06/06/21 (!) 172/104  12/06/20 (!) 174/110  08/21/20 (!) 174/103   Wt Readings from Last 3 Encounters:  06/06/21 252 lb (114.3 kg)  12/06/20 254 lb (115.2 kg)  08/21/20 254 lb 6.4 oz (115.4 kg)     He was last seen for hypertension 3 months ago.  BP at that visit was 172/104. Management since that visit includes ***. He reports {excellent/good/fair/poor:19665} compliance with treatment. He {is/is not:9024} having side effects. {document side effects if present:1} He {is/is not:9024} exercising. He {is/is not:9024} adherent to low salt diet.   Outside blood pressures are {enter patient reported home BP, or 'not being checked':1}.  He {does/does not:200015} smoke.  Use of agents associated with hypertension: {bp agents assoc with hypertension:511::"none"}.   --------------------------------------------------------------------------------------------------- Lipid/Cholesterol, follow-up  Last Lipid Panel: Lab  Results  Component Value Date   CHOL 158 06/06/2021   LDLCALC 87 06/06/2021   HDL 25 (L) 06/06/2021   TRIG 277 (H) 06/06/2021    He was last seen for this {1-12:18279} {days/wks/mos/yrs:310907} ago.  Management since that visit includes ***.  He reports {excellent/good/fair/poor:19665} compliance with treatment. He {is/is not:9024} having side effects. {document side effects if present:1}  Symptoms: {Yes/No:20286} appetite changes {Yes/No:20286} foot ulcerations  {Yes/No:20286} chest pain {Yes/No:20286} chest pressure/discomfort  {Yes/No:20286} dyspnea {Yes/No:20286} orthopnea  {Yes/No:20286} fatigue {Yes/No:20286} lower extremity edema  {Yes/No:20286} palpitations {Yes/No:20286} paroxysmal nocturnal dyspnea  {Yes/No:20286} nausea {Yes/No:20286} numbness or tingling of extremity  {Yes/No:20286} polydipsia {Yes/No:20286} polyuria  {Yes/No:20286} speech difficulty {Yes/No:20286} syncope   He is following a {diet:21022986} diet. Current exercise: {exercise WUJWJ:19147}  Last metabolic panel Lab Results  Component Value Date   GLUCOSE 224 (H) 06/06/2021   NA 140 06/06/2021   K 4.2 06/06/2021   BUN 13 06/06/2021   CREATININE 1.08 06/06/2021   EGFR 80 06/06/2021   GFRNONAA 75 12/06/2020   CALCIUM 9.6 06/06/2021   AST 18 06/06/2021   ALT 27 06/06/2021   The 10-year ASCVD risk score (Arnett DK, et al., 2019) is: 48.3%  ---------------------------------------------------------------------------------------------------   {Link to patient history deactivated due to formatting error:1}  Medications: Outpatient Medications Prior to Visit  Medication Sig   acetaminophen (TYLENOL) 500 MG tablet Take 500 mg by mouth every 6 (six) hours as needed.  (Patient not taking: Reported on 06/06/2021)   acetaminophen (TYLENOL) 500 MG tablet Take 500 mg by mouth daily.   albuterol (VENTOLIN HFA) 108 (90 Base) MCG/ACT inhaler Inhale 2 puffs into the lungs every 6 (six) hours as needed for  wheezing or shortness of breath.   amLODipine (NORVASC) 10 MG tablet Take 1 tablet (10 mg  total) by mouth daily.   aspirin 81 MG EC tablet Take by mouth.  (Patient not taking: Reported on 06/06/2021)   atorvastatin (LIPITOR) 10 MG tablet Take 1 tablet (10 mg total) by mouth daily.   blood glucose meter kit and supplies Check glucose once a day.  (FOR ICD-10 E10.9, E11.9).   buPROPion (WELLBUTRIN XL) 300 MG 24 hr tablet Take 1 tablet (300 mg total) by mouth 2 (two) times daily.   carvedilol (COREG) 12.5 MG tablet Take 1 tablet (12.5 mg total) by mouth 2 (two) times daily with a meal.   carvedilol (COREG) 6.25 MG tablet Take 6.25 mg by mouth 2 (two) times daily.   Fluticasone-Salmeterol (ADVAIR DISKUS) 250-50 MCG/DOSE AEPB Inhale 1 puff into the lungs 2 (two) times daily. (Patient not taking: Reported on 06/06/2021)   hydrOXYzine (VISTARIL) 25 MG capsule Take 1 capsule (25 mg total) by mouth every 8 (eight) hours as needed.   lisinopril (ZESTRIL) 40 MG tablet Take 1 tablet (40 mg total) by mouth daily.   metFORMIN (GLUCOPHAGE XR) 500 MG 24 hr tablet Take 1 tablet (500 mg total) by mouth in the morning and at bedtime.   Multiple Vitamin (MULTIVITAMIN WITH MINERALS) TABS tablet Take 1 tablet by mouth daily.    nicotine (NICODERM CQ) 21 mg/24hr patch Place 1 patch (21 mg total) onto the skin daily.   NON FORMULARY Uses a CPAP machine at night    OXYGEN Inhale 3 L into the lungs at bedtime. (Patient not taking: Reported on 06/06/2021)   tamsulosin (FLOMAX) 0.4 MG CAPS capsule Take 1 capsule (0.4 mg total) by mouth daily.   triamterene-hydrochlorothiazide (MAXZIDE-25) 37.5-25 MG tablet Take 1 tablet by mouth daily.   No facility-administered medications prior to visit.    Review of Systems  {Labs  Heme  Chem  Endocrine  Serology  Results Review (optional):23779}   Objective    There were no vitals taken for this visit. {Show previous vital signs (optional):23777}  Physical Exam  ***  No  results found for any visits on 09/10/21.  Assessment & Plan     ***  No follow-ups on file.      {provider attestation***:1}   Gwyneth Sprout, [place] [phone] (phone) [phone] (fax)  [place]

## 2021-09-11 DIAGNOSIS — Z950 Presence of cardiac pacemaker: Secondary | ICD-10-CM | POA: Diagnosis not present

## 2021-09-11 DIAGNOSIS — E119 Type 2 diabetes mellitus without complications: Secondary | ICD-10-CM | POA: Diagnosis not present

## 2021-09-11 DIAGNOSIS — K439 Ventral hernia without obstruction or gangrene: Secondary | ICD-10-CM | POA: Diagnosis not present

## 2021-09-11 DIAGNOSIS — Z720 Tobacco use: Secondary | ICD-10-CM | POA: Diagnosis not present

## 2021-09-11 DIAGNOSIS — R3129 Other microscopic hematuria: Secondary | ICD-10-CM | POA: Diagnosis not present

## 2021-09-18 DIAGNOSIS — I720 Aneurysm of carotid artery: Secondary | ICD-10-CM | POA: Diagnosis not present

## 2021-09-18 DIAGNOSIS — J449 Chronic obstructive pulmonary disease, unspecified: Secondary | ICD-10-CM | POA: Diagnosis not present

## 2021-09-19 DIAGNOSIS — E119 Type 2 diabetes mellitus without complications: Secondary | ICD-10-CM | POA: Diagnosis not present

## 2021-09-19 DIAGNOSIS — R0600 Dyspnea, unspecified: Secondary | ICD-10-CM | POA: Diagnosis not present

## 2021-09-19 DIAGNOSIS — I10 Essential (primary) hypertension: Secondary | ICD-10-CM | POA: Diagnosis not present

## 2021-11-27 ENCOUNTER — Other Ambulatory Visit: Payer: Self-pay | Admitting: Family Medicine

## 2021-11-27 DIAGNOSIS — J410 Simple chronic bronchitis: Secondary | ICD-10-CM

## 2022-04-02 ENCOUNTER — Telehealth: Payer: Self-pay | Admitting: Family Medicine

## 2022-04-02 NOTE — Telephone Encounter (Signed)
Copied from CRM [id number]. Topic: Medicare AWV >> Apr 02, 2022  2:10 PM Claudette Laws R wrote: Reason for CRM:  Left message for patient to call back and schedule Medicare Annual Wellness Visit (AWV) in office.   If unable to come into the office for AWV,  please offer to do virtually or by telephone.  Last AWV:  04/18/2020  Please schedule at anytime with [facility] Health Advisor.  30 minute appointment for Virtual or phone 45 minute appointment for in office or Initial virtual/phone  Any questions, please contact me at [phone]

## 2022-04-05 ENCOUNTER — Other Ambulatory Visit: Payer: Self-pay | Admitting: Family Medicine

## 2022-04-05 DIAGNOSIS — I10 Essential (primary) hypertension: Secondary | ICD-10-CM

## 2022-05-05 ENCOUNTER — Telehealth: Payer: Self-pay | Admitting: Family Medicine

## 2022-05-05 NOTE — Telephone Encounter (Signed)
Per patient No longer a patient

## 2022-06-09 ENCOUNTER — Telehealth: Payer: Self-pay | Admitting: Family Medicine

## 2022-06-09 DIAGNOSIS — E118 Type 2 diabetes mellitus with unspecified complications: Secondary | ICD-10-CM

## 2022-06-09 MED ORDER — ATORVASTATIN CALCIUM 10 MG PO TABS: 10.0000 mg | ORAL_TABLET | Freq: Every day | ORAL | 3 refills | Status: AC

## 2022-06-09 NOTE — Telephone Encounter (Signed)
[facility] faxed refill request for the following medications:   atorvastatin (LIPITOR) 10 MG tablet    Please advise.   

## 2022-06-09 NOTE — Telephone Encounter (Signed)
Refilled

## 2022-09-11 ENCOUNTER — Telehealth: Payer: Self-pay | Admitting: Family Medicine

## 2022-09-11 NOTE — Telephone Encounter (Signed)
[facility] faxed refill request for the following medications:  albuterol (VENTOLIN HFA) 108 (90 Base) MCG/ACT inhaler   Please advise.  

## 2022-10-23 ENCOUNTER — Telehealth: Payer: Self-pay | Admitting: Family Medicine

## 2022-10-23 NOTE — Telephone Encounter (Signed)
Left message for patient to call back and schedule Medicare Annual Wellness Visit (AWV) in office.  ° °If not able to come in office, please offer to do virtually or by telephone.  Left office number and my jabber #[phone]. ° °Last AWV:04/18/2020 ° °Please schedule at anytime with Nurse Health Advisor. °  °
